# Patient Record
Sex: Male | Born: 1965
Health system: Southern US, Community
[De-identification: ages and names within clinical notes are randomized; demographics above are authoritative.]

## PROBLEM LIST (undated history)

## (undated) DIAGNOSIS — K529 Noninfective gastroenteritis and colitis, unspecified: Secondary | ICD-10-CM

## (undated) DIAGNOSIS — Z923 Personal history of irradiation: Secondary | ICD-10-CM

## (undated) DIAGNOSIS — I10 Essential (primary) hypertension: Secondary | ICD-10-CM

## (undated) DIAGNOSIS — C801 Malignant (primary) neoplasm, unspecified: Secondary | ICD-10-CM

## (undated) DIAGNOSIS — Z85810 Personal history of malignant neoplasm of tongue: Secondary | ICD-10-CM

## (undated) DIAGNOSIS — G4700 Insomnia, unspecified: Secondary | ICD-10-CM

## (undated) DIAGNOSIS — E7800 Pure hypercholesterolemia, unspecified: Secondary | ICD-10-CM

## (undated) DIAGNOSIS — I639 Cerebral infarction, unspecified: Secondary | ICD-10-CM

## (undated) DIAGNOSIS — K219 Gastro-esophageal reflux disease without esophagitis: Secondary | ICD-10-CM

## (undated) HISTORY — DX: Personal history of malignant neoplasm of tongue: Z85.810

## (undated) HISTORY — DX: Cerebral infarction, unspecified: I63.9

## (undated) HISTORY — DX: Gastro-esophageal reflux disease without esophagitis: K21.9

## (undated) HISTORY — PX: HAND SURGERY: SHX662

## (undated) HISTORY — DX: Malignant (primary) neoplasm, unspecified: C80.1

## (undated) HISTORY — PX: MOUTH SURGERY: SHX715

## (undated) HISTORY — PX: WISDOM TOOTH EXTRACTION: SHX21

---
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
## 1997-09-19 HISTORY — PX: BACK SURGERY: SHX140

## 2003-04-17 ENCOUNTER — Encounter: Admission: RE | Admit: 2003-04-17 | Discharge: 2003-04-17 | Payer: Self-pay | Admitting: Family Medicine

## 2003-04-17 ENCOUNTER — Encounter: Payer: Self-pay | Admitting: Family Medicine

## 2003-04-19 ENCOUNTER — Encounter: Payer: Self-pay | Admitting: Family Medicine

## 2003-04-19 ENCOUNTER — Encounter: Admission: RE | Admit: 2003-04-19 | Discharge: 2003-04-19 | Payer: Self-pay | Admitting: Family Medicine

## 2003-11-20 ENCOUNTER — Inpatient Hospital Stay (HOSPITAL_COMMUNITY): Admission: EM | Admit: 2003-11-20 | Discharge: 2003-11-22 | Payer: Self-pay | Admitting: Emergency Medicine

## 2003-11-22 ENCOUNTER — Encounter: Payer: Self-pay | Admitting: Cardiology

## 2004-11-27 ENCOUNTER — Emergency Department (HOSPITAL_COMMUNITY): Admission: EM | Admit: 2004-11-27 | Discharge: 2004-11-27 | Payer: Self-pay | Admitting: Emergency Medicine

## 2005-02-11 ENCOUNTER — Encounter: Admission: RE | Admit: 2005-02-11 | Discharge: 2005-02-11 | Payer: Self-pay | Admitting: Family Medicine

## 2005-02-20 ENCOUNTER — Encounter: Admission: RE | Admit: 2005-02-20 | Discharge: 2005-02-20 | Payer: Self-pay | Admitting: Family Medicine

## 2005-10-06 ENCOUNTER — Encounter: Admission: RE | Admit: 2005-10-06 | Discharge: 2005-10-06 | Payer: Self-pay | Admitting: Family Medicine

## 2005-12-10 ENCOUNTER — Encounter: Admission: RE | Admit: 2005-12-10 | Discharge: 2005-12-10 | Payer: Self-pay | Admitting: Family Medicine

## 2006-10-17 ENCOUNTER — Encounter: Admission: RE | Admit: 2006-10-17 | Discharge: 2006-10-17 | Payer: Self-pay | Admitting: Family Medicine

## 2018-08-05 ENCOUNTER — Observation Stay (HOSPITAL_COMMUNITY): Payer: Medicaid Other

## 2018-08-05 ENCOUNTER — Encounter (HOSPITAL_COMMUNITY): Payer: Self-pay | Admitting: Emergency Medicine

## 2018-08-05 ENCOUNTER — Inpatient Hospital Stay (HOSPITAL_COMMUNITY)
Admission: EM | Admit: 2018-08-05 | Discharge: 2018-08-09 | DRG: 064 | Disposition: A | Discharge status: Home / Self Care | Payer: Medicaid Other | Attending: Family Medicine | Admitting: Family Medicine

## 2018-08-05 ENCOUNTER — Other Ambulatory Visit: Payer: Self-pay

## 2018-08-05 ENCOUNTER — Emergency Department (HOSPITAL_COMMUNITY): Payer: Medicaid Other

## 2018-08-05 DIAGNOSIS — I2510 Atherosclerotic heart disease of native coronary artery without angina pectoris: Secondary | ICD-10-CM | POA: Diagnosis present

## 2018-08-05 DIAGNOSIS — D696 Thrombocytopenia, unspecified: Secondary | ICD-10-CM | POA: Diagnosis present

## 2018-08-05 DIAGNOSIS — R945 Abnormal results of liver function studies: Secondary | ICD-10-CM | POA: Diagnosis not present

## 2018-08-05 DIAGNOSIS — C090 Malignant neoplasm of tonsillar fossa: Secondary | ICD-10-CM | POA: Diagnosis present

## 2018-08-05 DIAGNOSIS — J358 Other chronic diseases of tonsils and adenoids: Secondary | ICD-10-CM

## 2018-08-05 DIAGNOSIS — R000 Tachycardia, unspecified: Secondary | ICD-10-CM | POA: Diagnosis not present

## 2018-08-05 DIAGNOSIS — R220 Localized swelling, mass and lump, head: Secondary | ICD-10-CM

## 2018-08-05 DIAGNOSIS — I6330 Cerebral infarction due to thrombosis of unspecified cerebral artery: Secondary | ICD-10-CM

## 2018-08-05 DIAGNOSIS — C099 Malignant neoplasm of tonsil, unspecified: Secondary | ICD-10-CM | POA: Diagnosis present

## 2018-08-05 DIAGNOSIS — Z23 Encounter for immunization: Secondary | ICD-10-CM

## 2018-08-05 DIAGNOSIS — K760 Fatty (change of) liver, not elsewhere classified: Secondary | ICD-10-CM | POA: Diagnosis present

## 2018-08-05 DIAGNOSIS — R569 Unspecified convulsions: Secondary | ICD-10-CM | POA: Diagnosis not present

## 2018-08-05 DIAGNOSIS — R29700 NIHSS score 0: Secondary | ICD-10-CM | POA: Diagnosis present

## 2018-08-05 DIAGNOSIS — Q211 Atrial septal defect: Secondary | ICD-10-CM

## 2018-08-05 DIAGNOSIS — D7589 Other specified diseases of blood and blood-forming organs: Secondary | ICD-10-CM | POA: Diagnosis present

## 2018-08-05 DIAGNOSIS — I10 Essential (primary) hypertension: Secondary | ICD-10-CM | POA: Diagnosis not present

## 2018-08-05 DIAGNOSIS — Y92410 Unspecified street and highway as the place of occurrence of the external cause: Secondary | ICD-10-CM

## 2018-08-05 DIAGNOSIS — N179 Acute kidney failure, unspecified: Secondary | ICD-10-CM | POA: Diagnosis present

## 2018-08-05 DIAGNOSIS — K219 Gastro-esophageal reflux disease without esophagitis: Secondary | ICD-10-CM | POA: Diagnosis present

## 2018-08-05 DIAGNOSIS — F1010 Alcohol abuse, uncomplicated: Secondary | ICD-10-CM | POA: Diagnosis present

## 2018-08-05 DIAGNOSIS — R7989 Other specified abnormal findings of blood chemistry: Secondary | ICD-10-CM

## 2018-08-05 DIAGNOSIS — I129 Hypertensive chronic kidney disease with stage 1 through stage 4 chronic kidney disease, or unspecified chronic kidney disease: Secondary | ICD-10-CM | POA: Diagnosis present

## 2018-08-05 DIAGNOSIS — E876 Hypokalemia: Secondary | ICD-10-CM | POA: Diagnosis not present

## 2018-08-05 DIAGNOSIS — I63521 Cerebral infarction due to unspecified occlusion or stenosis of right anterior cerebral artery: Principal | ICD-10-CM | POA: Diagnosis present

## 2018-08-05 DIAGNOSIS — I511 Rupture of chordae tendineae, not elsewhere classified: Secondary | ICD-10-CM | POA: Diagnosis present

## 2018-08-05 DIAGNOSIS — N182 Chronic kidney disease, stage 2 (mild): Secondary | ICD-10-CM | POA: Diagnosis present

## 2018-08-05 DIAGNOSIS — I739 Peripheral vascular disease, unspecified: Secondary | ICD-10-CM | POA: Diagnosis present

## 2018-08-05 DIAGNOSIS — R55 Syncope and collapse: Secondary | ICD-10-CM | POA: Diagnosis present

## 2018-08-05 DIAGNOSIS — S199XXA Unspecified injury of neck, initial encounter: Secondary | ICD-10-CM | POA: Diagnosis not present

## 2018-08-05 DIAGNOSIS — R410 Disorientation, unspecified: Secondary | ICD-10-CM | POA: Diagnosis not present

## 2018-08-05 DIAGNOSIS — F17210 Nicotine dependence, cigarettes, uncomplicated: Secondary | ICD-10-CM | POA: Diagnosis present

## 2018-08-05 HISTORY — DX: Essential (primary) hypertension: I10

## 2018-08-05 LAB — I-STAT CHEM 8, ED
BUN: 27 mg/dL — ABNORMAL HIGH (ref 6–20)
Calcium, Ion: 0.93 mmol/L — ABNORMAL LOW (ref 1.15–1.40)
Chloride: 96 mmol/L — ABNORMAL LOW (ref 98–111)
Creatinine, Ser: 1.5 mg/dL — ABNORMAL HIGH (ref 0.61–1.24)
Glucose, Bld: 115 mg/dL — ABNORMAL HIGH (ref 70–99)
HCT: 39 % (ref 39.0–52.0)
Hemoglobin: 13.3 g/dL (ref 13.0–17.0)
Potassium: 3 mmol/L — ABNORMAL LOW (ref 3.5–5.1)
Sodium: 135 mmol/L (ref 135–145)
TCO2: 28 mmol/L (ref 22–32)

## 2018-08-05 LAB — CBC WITH DIFFERENTIAL/PLATELET
Abs Immature Granulocytes: 0 10*3/uL (ref 0.0–0.1)
Basophils Absolute: 0.1 10*3/uL (ref 0.0–0.1)
Basophils Relative: 1 %
Eosinophils Absolute: 0.1 10*3/uL (ref 0.0–0.7)
Eosinophils Relative: 2 %
HCT: 42.8 % (ref 39.0–52.0)
Hemoglobin: 14.8 g/dL (ref 13.0–17.0)
Immature Granulocytes: 1 %
Lymphocytes Relative: 24 %
Lymphs Abs: 1.7 10*3/uL (ref 0.7–4.0)
MCH: 36.3 pg — ABNORMAL HIGH (ref 26.0–34.0)
MCHC: 34.6 g/dL (ref 30.0–36.0)
MCV: 104.9 fL — ABNORMAL HIGH (ref 78.0–100.0)
Monocytes Absolute: 0.7 10*3/uL (ref 0.1–1.0)
Monocytes Relative: 9 %
Neutro Abs: 4.5 10*3/uL (ref 1.7–7.7)
Neutrophils Relative %: 63 %
Platelets: 92 10*3/uL — ABNORMAL LOW (ref 150–400)
RBC: 4.08 MIL/uL — ABNORMAL LOW (ref 4.22–5.81)
RDW: 13.2 % (ref 11.5–15.5)
WBC: 7.1 10*3/uL (ref 4.0–10.5)

## 2018-08-05 LAB — RAPID URINE DRUG SCREEN, HOSP PERFORMED
Amphetamines: NOT DETECTED
Barbiturates: NOT DETECTED
Benzodiazepines: NOT DETECTED
Cocaine: NOT DETECTED
Opiates: NOT DETECTED
Tetrahydrocannabinol: NOT DETECTED

## 2018-08-05 LAB — COMPREHENSIVE METABOLIC PANEL
ALT: 23 U/L (ref 0–44)
AST: 46 U/L — ABNORMAL HIGH (ref 15–41)
Albumin: 3.8 g/dL (ref 3.5–5.0)
Alkaline Phosphatase: 73 U/L (ref 38–126)
Anion gap: 17 — ABNORMAL HIGH (ref 5–15)
BUN: 21 mg/dL — ABNORMAL HIGH (ref 6–20)
CO2: 25 mmol/L (ref 22–32)
Calcium: 8.2 mg/dL — ABNORMAL LOW (ref 8.9–10.3)
Chloride: 92 mmol/L — ABNORMAL LOW (ref 98–111)
Creatinine, Ser: 1.8 mg/dL — ABNORMAL HIGH (ref 0.61–1.24)
GFR calc Af Amer: 48 mL/min — ABNORMAL LOW (ref >60)
GFR calc non Af Amer: 42 mL/min — ABNORMAL LOW (ref >60)
Glucose, Bld: 118 mg/dL — ABNORMAL HIGH (ref 70–99)
Potassium: 2.9 mmol/L — ABNORMAL LOW (ref 3.5–5.1)
Sodium: 134 mmol/L — ABNORMAL LOW (ref 135–145)
Total Bilirubin: 1.8 mg/dL — ABNORMAL HIGH (ref 0.3–1.2)
Total Protein: 7.8 g/dL (ref 6.5–8.1)

## 2018-08-05 LAB — PROTIME-INR
INR: 0.96
PROTHROMBIN TIME: 12.7 s (ref 11.4–15.2)

## 2018-08-05 LAB — MAGNESIUM: Magnesium: 1.4 mg/dL — ABNORMAL LOW (ref 1.7–2.4)

## 2018-08-05 LAB — I-STAT TROPONIN, ED: Troponin i, poc: 0.02 ng/mL (ref 0.00–0.08)

## 2018-08-05 LAB — ETHANOL: Alcohol, Ethyl (B): <10 mg/dL (ref <10)

## 2018-08-05 LAB — CBG MONITORING, ED: Glucose-Capillary: 114 mg/dL — ABNORMAL HIGH (ref 70–99)

## 2018-08-05 MED ORDER — IOHEXOL 300 MG/ML  SOLN
100.0000 mL | Freq: Once | INTRAMUSCULAR | Status: AC | PRN
  Administered 2018-08-05: 100 mL via INTRAVENOUS

## 2018-08-05 MED ORDER — MAGNESIUM SULFATE 2 GM/50ML IV SOLN
2.0000 g | Freq: Once | INTRAVENOUS | Status: AC
  Administered 2018-08-05: 2 g via INTRAVENOUS
  Filled 2018-08-05: qty 50

## 2018-08-05 MED ORDER — TETANUS-DIPHTH-ACELL PERTUSSIS 5-2.5-18.5 LF-MCG/0.5 IM SUSP
0.5000 mL | Freq: Once | INTRAMUSCULAR | Status: AC
  Administered 2018-08-05: 0.5 mL via INTRAMUSCULAR
  Filled 2018-08-05: qty 0.5

## 2018-08-05 MED ORDER — POTASSIUM CHLORIDE CRYS ER 20 MEQ PO TBCR
40.0000 meq | EXTENDED_RELEASE_TABLET | Freq: Once | ORAL | Status: AC
  Administered 2018-08-05: 40 meq via ORAL
  Filled 2018-08-05: qty 2

## 2018-08-05 MED ORDER — ACETAMINOPHEN 650 MG RE SUPP: 650.0000 mg | Freq: Four times a day (QID) | RECTAL | Status: DC | PRN

## 2018-08-05 MED ORDER — POTASSIUM CHLORIDE 10 MEQ/100ML IV SOLN
INTRAVENOUS | Status: AC
  Administered 2018-08-05: 10 meq via INTRAVENOUS
  Filled 2018-08-05: qty 100

## 2018-08-05 MED ORDER — POTASSIUM CHLORIDE 10 MEQ/100ML IV SOLN
10.0000 meq | INTRAVENOUS | Status: AC
  Administered 2018-08-05 (×3): 10 meq via INTRAVENOUS
  Filled 2018-08-05 (×2): qty 100

## 2018-08-05 MED ORDER — POTASSIUM CHLORIDE IN NACL 20-0.9 MEQ/L-% IV SOLN
INTRAVENOUS | Status: DC
  Administered 2018-08-05 – 2018-08-06 (×2): via INTRAVENOUS
  Filled 2018-08-05 (×4): qty 1000

## 2018-08-05 MED ORDER — ACETAMINOPHEN 325 MG PO TABS
650.0000 mg | ORAL_TABLET | Freq: Four times a day (QID) | ORAL | Status: DC | PRN
  Administered 2018-08-06: 650 mg via ORAL
  Filled 2018-08-05: qty 2

## 2018-08-05 MED ORDER — ENOXAPARIN SODIUM 40 MG/0.4ML ~~LOC~~ SOLN
40.0000 mg | SUBCUTANEOUS | Status: DC
  Administered 2018-08-06 – 2018-08-08 (×3): 40 mg via SUBCUTANEOUS
  Filled 2018-08-05 (×3): qty 0.4

## 2018-08-05 MED ORDER — SODIUM CHLORIDE 0.9 % IV BOLUS
1000.0000 mL | Freq: Once | INTRAVENOUS | Status: AC
  Administered 2018-08-05: 1000 mL via INTRAVENOUS

## 2018-08-05 NOTE — ED Notes (Signed)
Pt back in room from US.

## 2018-08-05 NOTE — ED Notes (Signed)
Pt aware of need for urine sample, urinal at bedside 

## 2018-08-05 NOTE — ED Notes (Signed)
Report attempted x 1

## 2018-08-05 NOTE — ED Notes (Signed)
Report attempted x2
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
## 2018-08-05 NOTE — ED Notes (Signed)
Patient transported to US 

## 2018-08-06 ENCOUNTER — Inpatient Hospital Stay (HOSPITAL_COMMUNITY): Payer: Medicaid Other

## 2018-08-06 ENCOUNTER — Observation Stay (HOSPITAL_COMMUNITY): Payer: Medicaid Other

## 2018-08-06 ENCOUNTER — Observation Stay (HOSPITAL_BASED_OUTPATIENT_CLINIC_OR_DEPARTMENT_OTHER): Payer: Medicaid Other

## 2018-08-06 DIAGNOSIS — I2510 Atherosclerotic heart disease of native coronary artery without angina pectoris: Secondary | ICD-10-CM | POA: Diagnosis present

## 2018-08-06 DIAGNOSIS — I639 Cerebral infarction, unspecified: Secondary | ICD-10-CM

## 2018-08-06 DIAGNOSIS — F17210 Nicotine dependence, cigarettes, uncomplicated: Secondary | ICD-10-CM | POA: Diagnosis present

## 2018-08-06 DIAGNOSIS — R55 Syncope and collapse: Secondary | ICD-10-CM | POA: Diagnosis not present

## 2018-08-06 DIAGNOSIS — R7989 Other specified abnormal findings of blood chemistry: Secondary | ICD-10-CM | POA: Diagnosis not present

## 2018-08-06 DIAGNOSIS — K760 Fatty (change of) liver, not elsewhere classified: Secondary | ICD-10-CM | POA: Diagnosis present

## 2018-08-06 DIAGNOSIS — Z23 Encounter for immunization: Secondary | ICD-10-CM | POA: Diagnosis not present

## 2018-08-06 DIAGNOSIS — E876 Hypokalemia: Secondary | ICD-10-CM

## 2018-08-06 DIAGNOSIS — I511 Rupture of chordae tendineae, not elsewhere classified: Secondary | ICD-10-CM | POA: Diagnosis present

## 2018-08-06 DIAGNOSIS — C099 Malignant neoplasm of tonsil, unspecified: Secondary | ICD-10-CM

## 2018-08-06 DIAGNOSIS — R29700 NIHSS score 0: Secondary | ICD-10-CM | POA: Diagnosis present

## 2018-08-06 DIAGNOSIS — I739 Peripheral vascular disease, unspecified: Secondary | ICD-10-CM | POA: Diagnosis not present

## 2018-08-06 DIAGNOSIS — Q211 Atrial septal defect: Secondary | ICD-10-CM | POA: Diagnosis not present

## 2018-08-06 DIAGNOSIS — N182 Chronic kidney disease, stage 2 (mild): Secondary | ICD-10-CM | POA: Diagnosis present

## 2018-08-06 DIAGNOSIS — K219 Gastro-esophageal reflux disease without esophagitis: Secondary | ICD-10-CM | POA: Diagnosis present

## 2018-08-06 DIAGNOSIS — F1010 Alcohol abuse, uncomplicated: Secondary | ICD-10-CM | POA: Diagnosis present

## 2018-08-06 DIAGNOSIS — S199XXA Unspecified injury of neck, initial encounter: Secondary | ICD-10-CM | POA: Diagnosis not present

## 2018-08-06 DIAGNOSIS — I10 Essential (primary) hypertension: Secondary | ICD-10-CM | POA: Diagnosis not present

## 2018-08-06 DIAGNOSIS — I082 Rheumatic disorders of both aortic and tricuspid valves: Secondary | ICD-10-CM | POA: Diagnosis not present

## 2018-08-06 DIAGNOSIS — D696 Thrombocytopenia, unspecified: Secondary | ICD-10-CM | POA: Diagnosis present

## 2018-08-06 DIAGNOSIS — I351 Nonrheumatic aortic (valve) insufficiency: Secondary | ICD-10-CM | POA: Diagnosis not present

## 2018-08-06 DIAGNOSIS — N179 Acute kidney failure, unspecified: Secondary | ICD-10-CM | POA: Diagnosis present

## 2018-08-06 DIAGNOSIS — D7589 Other specified diseases of blood and blood-forming organs: Secondary | ICD-10-CM | POA: Diagnosis present

## 2018-08-06 DIAGNOSIS — Y92410 Unspecified street and highway as the place of occurrence of the external cause: Secondary | ICD-10-CM | POA: Diagnosis not present

## 2018-08-06 DIAGNOSIS — I129 Hypertensive chronic kidney disease with stage 1 through stage 4 chronic kidney disease, or unspecified chronic kidney disease: Secondary | ICD-10-CM | POA: Diagnosis present

## 2018-08-06 DIAGNOSIS — I6330 Cerebral infarction due to thrombosis of unspecified cerebral artery: Secondary | ICD-10-CM | POA: Diagnosis not present

## 2018-08-06 DIAGNOSIS — I63521 Cerebral infarction due to unspecified occlusion or stenosis of right anterior cerebral artery: Secondary | ICD-10-CM | POA: Diagnosis present

## 2018-08-06 LAB — HIV ANTIBODY (ROUTINE TESTING W REFLEX): HIV SCREEN 4TH GENERATION: NONREACTIVE

## 2018-08-06 LAB — CBC
HEMATOCRIT: 37.7 % — AB (ref 39.0–52.0)
Hemoglobin: 13.2 g/dL (ref 13.0–17.0)
MCH: 37.1 pg — ABNORMAL HIGH (ref 26.0–34.0)
MCHC: 35 g/dL (ref 30.0–36.0)
MCV: 105.9 fL — AB (ref 78.0–100.0)
Platelets: 68 10*3/uL — ABNORMAL LOW (ref 150–400)
RBC: 3.56 MIL/uL — ABNORMAL LOW (ref 4.22–5.81)
RDW: 13.5 % (ref 11.5–15.5)
WBC: 4.9 10*3/uL (ref 4.0–10.5)

## 2018-08-06 LAB — COMPREHENSIVE METABOLIC PANEL
ALBUMIN: 3.2 g/dL — AB (ref 3.5–5.0)
ALT: 17 U/L (ref 0–44)
AST: 40 U/L (ref 15–41)
Alkaline Phosphatase: 60 U/L (ref 38–126)
Anion gap: 11 (ref 5–15)
BUN: 14 mg/dL (ref 6–20)
CHLORIDE: 100 mmol/L (ref 98–111)
CO2: 26 mmol/L (ref 22–32)
CREATININE: 1.43 mg/dL — AB (ref 0.61–1.24)
Calcium: 7.5 mg/dL — ABNORMAL LOW (ref 8.9–10.3)
GFR calc Af Amer: >60 mL/min (ref >60)
GFR calc non Af Amer: 55 mL/min — ABNORMAL LOW (ref >60)
GLUCOSE: 121 mg/dL — AB (ref 70–99)
Potassium: 2.8 mmol/L — ABNORMAL LOW (ref 3.5–5.1)
Sodium: 137 mmol/L (ref 135–145)
Total Bilirubin: 2 mg/dL — ABNORMAL HIGH (ref 0.3–1.2)
Total Protein: 6.7 g/dL (ref 6.5–8.1)

## 2018-08-06 LAB — TROPONIN I
Troponin I: 0.03 ng/mL (ref <0.03)
Troponin I: 0.03 ng/mL (ref <0.03)

## 2018-08-06 LAB — ECHOCARDIOGRAM COMPLETE
Height: 66 in
Weight: 2723.2 oz

## 2018-08-06 LAB — D-DIMER, QUANTITATIVE (NOT AT ARMC): D DIMER QUANT: 0.93 ug{FEU}/mL — AB (ref 0.00–0.50)

## 2018-08-06 LAB — MAGNESIUM: Magnesium: 1.5 mg/dL — ABNORMAL LOW (ref 1.7–2.4)

## 2018-08-06 MED ORDER — POTASSIUM CHLORIDE CRYS ER 20 MEQ PO TBCR
40.0000 meq | EXTENDED_RELEASE_TABLET | Freq: Once | ORAL | Status: AC
  Administered 2018-08-06: 40 meq via ORAL
  Filled 2018-08-06: qty 2

## 2018-08-06 MED ORDER — NICOTINE 21 MG/24HR TD PT24
21.0000 mg | MEDICATED_PATCH | Freq: Every day | TRANSDERMAL | Status: DC
  Filled 2018-08-06 (×2): qty 1

## 2018-08-06 MED ORDER — THIAMINE HCL 100 MG/ML IJ SOLN
100.0000 mg | Freq: Every day | INTRAMUSCULAR | Status: DC
  Filled 2018-08-06: qty 2

## 2018-08-06 MED ORDER — IOPAMIDOL (ISOVUE-370) INJECTION 76%
INTRAVENOUS | Status: AC
  Filled 2018-08-06: qty 100

## 2018-08-06 MED ORDER — MAGNESIUM SULFATE 2 GM/50ML IV SOLN
2.0000 g | Freq: Once | INTRAVENOUS | Status: AC
  Administered 2018-08-06: 2 g via INTRAVENOUS
  Filled 2018-08-06: qty 50

## 2018-08-06 MED ORDER — LORAZEPAM 2 MG/ML IJ SOLN: 0.0000 mg | Freq: Two times a day (BID) | INTRAMUSCULAR | Status: DC

## 2018-08-06 MED ORDER — LORAZEPAM 2 MG/ML IJ SOLN
0.0000 mg | Freq: Four times a day (QID) | INTRAMUSCULAR | Status: DC
Start: 2018-08-06 — End: 2018-08-08

## 2018-08-06 MED ORDER — POTASSIUM CHLORIDE IN NACL 20-0.9 MEQ/L-% IV SOLN
INTRAVENOUS | Status: AC
  Administered 2018-08-06 – 2018-08-07 (×2): via INTRAVENOUS
  Filled 2018-08-06 (×2): qty 1000

## 2018-08-06 MED ORDER — ATORVASTATIN CALCIUM 40 MG PO TABS
40.0000 mg | ORAL_TABLET | Freq: Every day | ORAL | Status: DC
  Administered 2018-08-07 – 2018-08-08 (×2): 40 mg via ORAL
  Filled 2018-08-06 (×2): qty 1

## 2018-08-06 MED ORDER — VITAMIN B-1 100 MG PO TABS
100.0000 mg | ORAL_TABLET | Freq: Every day | ORAL | Status: DC
  Administered 2018-08-06 – 2018-08-09 (×4): 100 mg via ORAL
  Filled 2018-08-06 (×4): qty 1

## 2018-08-06 MED ORDER — ADULT MULTIVITAMIN W/MINERALS CH
1.0000 | ORAL_TABLET | Freq: Every day | ORAL | Status: DC
  Administered 2018-08-06 – 2018-08-09 (×4): 1 via ORAL
  Filled 2018-08-06 (×4): qty 1

## 2018-08-06 MED ORDER — LORAZEPAM 2 MG/ML IJ SOLN: 1.0000 mg | Freq: Four times a day (QID) | INTRAMUSCULAR | Status: DC | PRN

## 2018-08-06 MED ORDER — LORAZEPAM 1 MG PO TABS: 1.0000 mg | ORAL_TABLET | Freq: Four times a day (QID) | ORAL | Status: DC | PRN

## 2018-08-06 MED ORDER — IOPAMIDOL (ISOVUE-370) INJECTION 76%
100.0000 mL | Freq: Once | INTRAVENOUS | Status: AC | PRN
  Administered 2018-08-06: 100 mL via INTRAVENOUS

## 2018-08-06 MED ORDER — FOLIC ACID 1 MG PO TABS
1.0000 mg | ORAL_TABLET | Freq: Every day | ORAL | Status: DC
  Administered 2018-08-06 – 2018-08-07 (×2): 1 mg via ORAL
  Filled 2018-08-06 (×2): qty 1

## 2018-08-06 MED ORDER — ASPIRIN 325 MG PO TABS
325.0000 mg | ORAL_TABLET | Freq: Every day | ORAL | Status: DC
  Administered 2018-08-06 – 2018-08-09 (×4): 325 mg via ORAL
  Filled 2018-08-06 (×4): qty 1

## 2018-08-06 NOTE — Progress Notes (Signed)
EEG complete - results pending 

## 2018-08-06 NOTE — Progress Notes (Signed)
Pt's BP 177/109.  Pt denies any pain. Md on call made aware. No new orders received. MD stated he is okay with BP less than 220/110 for the next 24 hours. Will cont to monitor pt.
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
## 2018-08-06 NOTE — Procedures (Signed)
History: 52 yo M being evaluated for syncope  Sedation: None  Technique: This is a 21 channel routine scalp EEG performed at the bedside with bipolar and monopolar montages arranged in accordance to the international 10/20 system of electrode placement. One channel was dedicated to EKG recording.    Background: The background consists of intermixed alpha and beta activities. There is a well defined posterior dominant rhythm of 8 Hz that attenuates with eye opening. Sleep is not recorded.    EEG Abnormalities: None  Clinical Interpretation: This normal EEG is recorded in the waking state. There was no seizure or seizure predisposition recorded on this study. Please note that a normal EEG does not preclude the possibility of epilepsy.   Roland Rack, MD [facility] [phone]  If 7pm- 7am, please page neurology on call as listed in [place].

## 2018-08-06 NOTE — Progress Notes (Signed)
  Echocardiogram 2D Echocardiogram has been performed.  Jennette Dubin 08/06/2018, 11:18 AM
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
## 2018-08-06 NOTE — Progress Notes (Signed)
*  Preliminary Results* Carotid artery duplex has been completed. Bilateral internal carotid arteries are 1-39%.  Vertebral arteries are patent with antegrade flow.  08/06/2018 10:40 AM  Brian Dixon Dawna Part
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
## 2018-08-06 NOTE — Care Management Note (Signed)
Case Management Note  Patient Details  Name: Brian Dixon MRN: [id number] Date of Birth: 21-May-1966  Subjective/Objective:     Syncope              Action/Plan: Patient was independent prior to admission; he lost his job, no PCP, no medical insurance; he is agreeable to go to the [facility] for follow up care. The Financial Counselor to see the patient to determine what he might qualify for. CM will continue to follow for progression of care.  Expected Discharge Date:    possibly 08/09/2018              Expected Discharge Plan:  Home/Self Care  In-House Referral:   Financial Counselor  Discharge planning Services  CM Consult  Status of Service:  In process, will continue to follow  Sherrilyn Rist [phone] 08/06/2018, 1:55 PM

## 2018-08-06 NOTE — Progress Notes (Signed)
    [facility] has been requested to perform a transesophageal echocardiogram on Brian Dixon for stroke.  After careful review of history and examination, the risks and benefits of transesophageal echocardiogram have been explained including risks of esophageal damage, perforation (1:10,000 risk), bleeding, pharyngeal hematoma as well as other potential complications associated with conscious sedation including aspiration, arrhythmia, respiratory failure and death. Alternatives to treatment were discussed, questions were answered. Patient is willing to proceed.   Pt is scheduled for TEE in endoscopy 08/09/18 at noon with Dr. Meda Coffee. NPO Sunday night.  Tami Lin [place], [place]  08/06/2018 3:21 PM

## 2018-08-07 ENCOUNTER — Inpatient Hospital Stay (HOSPITAL_COMMUNITY): Payer: Medicaid Other

## 2018-08-07 DIAGNOSIS — I6330 Cerebral infarction due to thrombosis of unspecified cerebral artery: Secondary | ICD-10-CM

## 2018-08-07 LAB — LIPID PANEL
CHOLESTEROL: 173 mg/dL (ref 0–200)
HDL: 54 mg/dL (ref >40)
LDL CALC: 100 mg/dL — AB (ref 0–99)
TRIGLYCERIDES: 97 mg/dL (ref <150)
Total CHOL/HDL Ratio: 3.2 RATIO
VLDL: 19 mg/dL (ref 0–40)

## 2018-08-07 LAB — BASIC METABOLIC PANEL
Anion gap: 8 (ref 5–15)
BUN: 11 mg/dL (ref 6–20)
CHLORIDE: 101 mmol/L (ref 98–111)
CO2: 27 mmol/L (ref 22–32)
Calcium: 7.3 mg/dL — ABNORMAL LOW (ref 8.9–10.3)
Creatinine, Ser: 1.32 mg/dL — ABNORMAL HIGH (ref 0.61–1.24)
GFR calc Af Amer: >60 mL/min (ref >60)
GFR calc non Af Amer: >60 mL/min (ref >60)
Glucose, Bld: 82 mg/dL (ref 70–99)
Potassium: 2.8 mmol/L — ABNORMAL LOW (ref 3.5–5.1)
SODIUM: 136 mmol/L (ref 135–145)

## 2018-08-07 LAB — RAPID URINE DRUG SCREEN, HOSP PERFORMED
AMPHETAMINES: NOT DETECTED
Barbiturates: NOT DETECTED
Benzodiazepines: NOT DETECTED
COCAINE: NOT DETECTED
OPIATES: NOT DETECTED
Tetrahydrocannabinol: NOT DETECTED

## 2018-08-07 LAB — HEPATITIS PANEL, ACUTE
HEP A IGM: NEGATIVE
HEP B C IGM: NEGATIVE
Hepatitis B Surface Ag: NEGATIVE

## 2018-08-07 LAB — FOLATE: Folate: 7.1 ng/mL (ref >5.9)

## 2018-08-07 LAB — MAGNESIUM: MAGNESIUM: 1.6 mg/dL — AB (ref 1.7–2.4)

## 2018-08-07 LAB — FERRITIN: Ferritin: 319 ng/mL (ref 24–336)

## 2018-08-07 LAB — HEMOGLOBIN A1C
HEMOGLOBIN A1C: 4.7 % — AB (ref 4.8–5.6)
Mean Plasma Glucose: 88.19 mg/dL

## 2018-08-07 LAB — VITAMIN B12: Vitamin B-12: 202 pg/mL (ref 180–914)

## 2018-08-07 MED ORDER — AMLODIPINE BESYLATE 10 MG PO TABS
10.0000 mg | ORAL_TABLET | Freq: Every day | ORAL | Status: DC
  Administered 2018-08-07 – 2018-08-09 (×3): 10 mg via ORAL
  Filled 2018-08-07 (×2): qty 1

## 2018-08-07 MED ORDER — MAGNESIUM SULFATE 2 GM/50ML IV SOLN
2.0000 g | Freq: Once | INTRAVENOUS | Status: AC
  Administered 2018-08-07: 2 g via INTRAVENOUS
  Filled 2018-08-07: qty 50

## 2018-08-07 MED ORDER — CYANOCOBALAMIN 1000 MCG/ML IJ SOLN
1000.0000 ug | Freq: Once | INTRAMUSCULAR | Status: AC
  Administered 2018-08-07: 1000 ug via INTRAMUSCULAR
  Filled 2018-08-07: qty 1

## 2018-08-07 MED ORDER — LISINOPRIL 20 MG PO TABS
20.0000 mg | ORAL_TABLET | Freq: Every day | ORAL | Status: DC
  Administered 2018-08-07 – 2018-08-09 (×3): 20 mg via ORAL
  Filled 2018-08-07 (×2): qty 1

## 2018-08-07 MED ORDER — POTASSIUM CHLORIDE IN NACL 20-0.9 MEQ/L-% IV SOLN
INTRAVENOUS | Status: DC
  Administered 2018-08-07 – 2018-08-08 (×2): via INTRAVENOUS
  Filled 2018-08-07 (×3): qty 1000

## 2018-08-07 MED ORDER — VITAMIN B-12 1000 MCG PO TABS
1000.0000 ug | ORAL_TABLET | Freq: Every day | ORAL | Status: DC
  Administered 2018-08-08 – 2018-08-09 (×2): 1000 ug via ORAL
  Filled 2018-08-07 (×2): qty 1

## 2018-08-07 MED ORDER — POTASSIUM CHLORIDE CRYS ER 20 MEQ PO TBCR
40.0000 meq | EXTENDED_RELEASE_TABLET | Freq: Once | ORAL | Status: AC
  Administered 2018-08-07: 40 meq via ORAL
  Filled 2018-08-07: qty 2

## 2018-08-07 MED ORDER — VITAMIN B-12 1000 MCG PO TABS: 1000.0000 ug | ORAL_TABLET | Freq: Every day | ORAL | Status: DC
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
## 2018-08-08 ENCOUNTER — Inpatient Hospital Stay (HOSPITAL_COMMUNITY): Payer: Medicaid Other

## 2018-08-08 DIAGNOSIS — I6330 Cerebral infarction due to thrombosis of unspecified cerebral artery: Secondary | ICD-10-CM

## 2018-08-08 LAB — BASIC METABOLIC PANEL
Anion gap: 10 (ref 5–15)
BUN: 10 mg/dL (ref 6–20)
CHLORIDE: 98 mmol/L (ref 98–111)
CO2: 26 mmol/L (ref 22–32)
Calcium: 7.7 mg/dL — ABNORMAL LOW (ref 8.9–10.3)
Creatinine, Ser: 1.34 mg/dL — ABNORMAL HIGH (ref 0.61–1.24)
GFR calc Af Amer: >60 mL/min (ref >60)
GFR calc non Af Amer: 59 mL/min — ABNORMAL LOW (ref >60)
Glucose, Bld: 93 mg/dL (ref 70–99)
Potassium: 2.9 mmol/L — ABNORMAL LOW (ref 3.5–5.1)
SODIUM: 134 mmol/L — AB (ref 135–145)

## 2018-08-08 MED ORDER — GUAIFENESIN-DM 100-10 MG/5ML PO SYRP
5.0000 mL | ORAL_SOLUTION | ORAL | Status: DC | PRN
  Administered 2018-08-08: 5 mL via ORAL
  Filled 2018-08-08: qty 5

## 2018-08-08 MED ORDER — MAGNESIUM SULFATE 2 GM/50ML IV SOLN
2.0000 g | Freq: Once | INTRAVENOUS | Status: AC
  Administered 2018-08-08: 2 g via INTRAVENOUS
  Filled 2018-08-08: qty 50

## 2018-08-08 MED ORDER — POTASSIUM CHLORIDE CRYS ER 20 MEQ PO TBCR
40.0000 meq | EXTENDED_RELEASE_TABLET | Freq: Three times a day (TID) | ORAL | Status: AC
  Administered 2018-08-08 (×2): 40 meq via ORAL
  Filled 2018-08-08 (×2): qty 2

## 2018-08-08 MED ORDER — POTASSIUM CHLORIDE CRYS ER 20 MEQ PO TBCR
40.0000 meq | EXTENDED_RELEASE_TABLET | Freq: Two times a day (BID) | ORAL | Status: DC
  Administered 2018-08-08: 40 meq via ORAL
  Filled 2018-08-08: qty 2

## 2018-08-08 MED ORDER — SODIUM CHLORIDE 0.9 % IV SOLN: INTRAVENOUS | Status: DC

## 2018-08-08 NOTE — Progress Notes (Signed)
STROKE TEAM PROGRESS NOTE   SUBJECTIVE (INTERVAL HISTORY) No family is at the bedside.  Patient sitting in chair watching TV. No complains. No acute event overnight. Pending TEE in am. NPO after midnight.   OBJECTIVE Temp:  [97.5 F (36.4 C)-99.1 F (37.3 C)] 97.5 F (36.4 C) (08/11 0451) Pulse Rate:  [79-90] 88 (08/11 0451) Cardiac Rhythm: Normal sinus rhythm (08/10 1918) Resp:  [16-20] 16 (08/11 0451) BP: (158-186)/(86-112) 158/86 (08/11 0451) SpO2:  [92 %-100 %] 98 % (08/11 0451) Weight:  [77.6 kg] 77.6 kg (08/11 0451)  CBC:  Recent Labs  Lab 08/05/18 1223 08/05/18 1345 08/06/18 0705  WBC 7.1  --  4.9  NEUTROABS 4.5  --   --   HGB 14.8 13.3 13.2  HCT 42.8 39.0 37.7*  MCV 104.9*  --  105.9*  PLT 92*  --  68*    Basic Metabolic Panel:  Recent Labs  Lab 08/06/18 0705 08/07/18 0302 08/08/18 0357  NA 137 136 134*  K 2.8* 2.8* 2.9*  CL 100 101 98  CO2 26 27 26   GLUCOSE 121* 82 93  BUN 14 11 10   CREATININE 1.43* 1.32* 1.34*  CALCIUM 7.5* 7.3* 7.7*  MG 1.5* 1.6*  --     Lipid Panel:     Component Value Date/Time   CHOL 173 08/07/2018 0302   TRIG 97 08/07/2018 0302   HDL 54 08/07/2018 0302   CHOLHDL 3.2 08/07/2018 0302   VLDL 19 08/07/2018 0302   LDLCALC 100 (H) 08/07/2018 0302   HgbA1c:  Lab Results  Component Value Date   HGBA1C 4.7 (L) 08/07/2018   Urine Drug Screen:     Component Value Date/Time   LABOPIA NONE DETECTED 08/07/2018 1220   COCAINSCRNUR NONE DETECTED 08/07/2018 1220   LABBENZ NONE DETECTED 08/07/2018 1220   AMPHETMU NONE DETECTED 08/07/2018 1220   THCU NONE DETECTED 08/07/2018 1220   LABBARB NONE DETECTED 08/07/2018 1220    Alcohol Level     Component Value Date/Time   ETH <10 08/05/2018 1319    IMAGING I have personally reviewed the radiological images below and agree with the radiology interpretations.  Ct Head Wo Contrast Ct Cervical Spine Wo Contrast 08/05/2018 IMPRESSION:   CT head:  1. Atrophy with periventricular  small vessel disease. No acute infarct. No evident acute hemorrhage.  2. Mass in the anterior third ventricle with increased attenuation, not causing hydrocephalus. 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  3.  Foci of aortic vascular calcification noted.  4.  There are foci of paranasal sinus disease.   CT cervical spine:  1.  No fracture. Areas of slight spondylolisthesis at C5-6 and C6-7 are felt to be due to underlying spondylosis.  2.  Multilevel osteoarthritic change.  3.  Foci of carotid artery calcification bilaterally.   Ct Soft Tissue Neck W Contrast 08/05/2018 IMPRESSION:  1. 4.2 Cm mass in the right oral cavity and oropharynx involving the tonsillar fossa, glossotonsillar sulcus, and right floor of mouth. There is prominent degree of submucosal tumor in the right anterior and posterior tongue. 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 No adenopathy noted in the neck.  2. Findings of sialadenitis in the right submandibular gland, which contacts the mass. There is likely both postobstructive sialadenitis and direct tumor invasion.   Ct Chest W Contrast 08/05/2018 IMPRESSION:  Negative for acute finding of the chest.  Coronary  artery disease of the left anterior descending.   Ct Angio Chest Pe W Or Wo Contrast 08/06/2018 IMPRESSION:  Negative for pulmonary emboli. Coronary artery disease.   Mr Brain Wo Contrast 08/05/2018 IMPRESSION:  1. Punctate focus of reduced diffusion within the left anterior inferior basal ganglia compatible with acute/early subacute infarction. No associated hemorrhage or mass effect.  2. No findings of cortical contusion, diffuse axonal injury, or intracranial hemorrhage.  3. Severe chronic microvascular ischemic changes and volume loss of the brain for age.  4. 9 mm colloid cyst.  No associated hydrocephalus.   US Abdomen Limited  Ruq 08/05/2018 IMPRESSION:  1. 5 mm soft tissue focus within the contracted gallbladder likely representing a small polyp.  2. No secondary signs of cholecystitis.  3. Echogenic liver parenchyma more commonly representing hepatic steatosis.   Transthoracic Echocardiogram  08/06/2018 Impressions: - Normal LV systolic function. No significant valvular disease.   Small echodense mobile target noted in LV, suspect that it is a   portion of mitral valve apparatus/small section of papillary   muscle, but cannot exclude other etiologies. Does not appear to   be causing significant mitral regurgitation.  Bilateral Carotid Dopplers 08/06/2018 Final Interpretation: Right Carotid: Velocities in the right ICA are consistent with a 1-39% stenosis. Left Carotid: Velocities in the left ICA are consistent with a 1-39% stenosis. Vertebrals: Bilateral vertebral arteries demonstrate antegrade flow.  TEE - pending 08/09/2018  EEG 08/06/2018 EEG Abnormalities: None Clinical Interpretation: This normal EEG is recorded in the waking state. There was no seizure or seizure predisposition recorded on this study. Please note that a normal EEG does not preclude the possibility of epilepsy.     PHYSICAL EXAM  Temp:  [97.5 F (36.4 C)-99.1 F (37.3 C)] 97.5 F (36.4 C) (08/11 0451) Pulse Rate:  [79-90] 88 (08/11 0451) Resp:  [16-20] 16 (08/11 0451) BP: (158-186)/(86-112) 158/86 (08/11 0451) SpO2:  [92 %-100 %] 98 % (08/11 0451) Weight:  [77.6 kg] 77.6 kg (08/11 0451)  General - Well nourished, well developed, in no apparent distress.  Ophthalmologic - fundi not visualized due to noncooperation.  Cardiovascular - Regular rate and rhythm with no murmur.  Mental Status -  Level of arousal and orientation to time, place, and person were intact. Language including expression, naming, repetition, comprehension was assessed and found intact. Attention span and concentration were normal. Fund of Knowledge  was assessed and was intact.  Cranial Nerves II - XII - II - Visual field intact OU. III, IV, VI - Extraocular movements intact. V - Facial sensation intact bilaterally. VII - Facial movement intact bilaterally. VIII - Hearing & vestibular intact bilaterally. X - Palate elevates symmetrically. XI - Chin turning & shoulder shrug intact bilaterally. XII - Tongue protrusion intact.  Motor Strength - The patient's strength was normal in all extremities and pronator drift was absent.  Bulk was normal and fasciculations were absent.   Motor Tone - Muscle tone was assessed at the neck and appendages and was normal.  Reflexes - The patient's reflexes were symmetrical in all extremities and he had no pathological reflexes.  Sensory - Light touch, temperature/pinprick were assessed and were symmetrical.    Coordination - The patient had normal movements in the hands and feet with no ataxia or dysmetria.  Tremor was absent.  Gait and Station - normal gait and stance.   ASSESSMENT/PLAN Brian Dixon is a 52 y.o. male with history of tobacco heavy use1.5-2 ppdfor >35 yrs, HTN, alcohol binge  drinking with binge drinking presenting with syncope. He did not receive IV t-PA due to no stroke like deficits.  Syncope  likely vasovagal syncope from nausea/dry hive related to heavy smoking and binge drinking  EEG normal  Now back to baseline  Educated on quit smoking and decrease drinking to < 2 drink per day  EF normal  EKG sinus tachy   TEE pending to rule out MV mobile density  No driving until episodes free for 6 months  Incidental stroke:  small left basal ganglia infarct - likely small vessel disease given significant risk factors.  Resultant  Back to baseline  CT head - Atrophy with periventricular small vessel disease.  MRI head - Punctate focus of reduced diffusion within the left anterior inferior basal ganglia compatible with acute/early subacute infarction.  MRA  head - left A1 severe stenosis  EEG - normal  Carotid Doppler - normal  2D Echo - NL LV systolic fx EF 40-37% - Small echodense mobile target in MV  TEE - pending - 08/09/2018  LDL - 100  HgbA1c - 4.7  VTE prophylaxis - Lovenox  No antithrombotic prior to admission, now on aspirin 325 mg daily  Patient counseled to be compliant with his antithrombotic medications  Ongoing aggressive stroke risk factor management  Therapy recommendations: none  Disposition:  Pending  Hypertension . BP tends to run high  . On amlodipine 10 and lisinopril 20mg   . 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 After hours, contact General Neurology
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
## 2018-08-08 NOTE — Plan of Care (Signed)
  Problem: Education: Goal: Knowledge of General Education information will improve Description Including pain rating scale, medication(s)/side effects and non-pharmacologic comfort measures Outcome: Completed/Met
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
## 2018-08-09 ENCOUNTER — Inpatient Hospital Stay (HOSPITAL_COMMUNITY): Payer: Medicaid Other | Admitting: Certified Registered Nurse Anesthetist

## 2018-08-09 ENCOUNTER — Encounter (HOSPITAL_COMMUNITY): Payer: Self-pay

## 2018-08-09 ENCOUNTER — Inpatient Hospital Stay (HOSPITAL_COMMUNITY): Payer: Medicaid Other

## 2018-08-09 ENCOUNTER — Encounter (HOSPITAL_COMMUNITY)
Admission: EM | Disposition: A | Discharge status: Home / Self Care | Payer: Self-pay | Source: Home / Self Care | Attending: Family Medicine

## 2018-08-09 DIAGNOSIS — I351 Nonrheumatic aortic (valve) insufficiency: Secondary | ICD-10-CM

## 2018-08-09 HISTORY — PX: TEE WITHOUT CARDIOVERSION: SHX5443

## 2018-08-09 LAB — BASIC METABOLIC PANEL
Anion gap: 9 (ref 5–15)
BUN: 12 mg/dL (ref 6–20)
CALCIUM: 8.2 mg/dL — AB (ref 8.9–10.3)
CO2: 22 mmol/L (ref 22–32)
CREATININE: 1.15 mg/dL (ref 0.61–1.24)
Chloride: 105 mmol/L (ref 98–111)
GFR calc Af Amer: >60 mL/min (ref >60)
GLUCOSE: 95 mg/dL (ref 70–99)
POTASSIUM: 3.4 mmol/L — AB (ref 3.5–5.1)
SODIUM: 136 mmol/L (ref 135–145)

## 2018-08-09 LAB — MAGNESIUM: MAGNESIUM: 1.7 mg/dL (ref 1.7–2.4)

## 2018-08-09 SURGERY — ECHOCARDIOGRAM, TRANSESOPHAGEAL
Anesthesia: Monitor Anesthesia Care

## 2018-08-09 MED ORDER — ASPIRIN 325 MG PO TABS: 325.0000 mg | ORAL_TABLET | Freq: Every day | ORAL | 0 refills | Status: DC

## 2018-08-09 MED ORDER — HYDRALAZINE HCL 20 MG/ML IJ SOLN
10.0000 mg | Freq: Once | INTRAMUSCULAR | Status: AC
  Administered 2018-08-09: 10 mg via INTRAVENOUS
  Filled 2018-08-09: qty 1

## 2018-08-09 MED ORDER — CYANOCOBALAMIN 1000 MCG PO TABS: 1000.0000 ug | ORAL_TABLET | Freq: Every day | ORAL | 0 refills | Status: DC

## 2018-08-09 MED ORDER — LIDOCAINE 2% (20 MG/ML) 5 ML SYRINGE
INTRAMUSCULAR | Status: DC | PRN
  Administered 2018-08-09: 100 mg via INTRAVENOUS

## 2018-08-09 MED ORDER — ATORVASTATIN CALCIUM 40 MG PO TABS: 40.0000 mg | ORAL_TABLET | Freq: Every day | ORAL | 0 refills | Status: DC

## 2018-08-09 MED ORDER — SODIUM CHLORIDE 0.9 % IV SOLN
INTRAVENOUS | Status: AC | PRN
  Administered 2018-08-09: 500 mL via INTRAMUSCULAR

## 2018-08-09 MED ORDER — AMLODIPINE BESYLATE 10 MG PO TABS: 10.0000 mg | ORAL_TABLET | Freq: Every day | ORAL | 0 refills | Status: DC

## 2018-08-09 MED ORDER — PROPOFOL 10 MG/ML IV BOLUS
INTRAVENOUS | Status: DC | PRN
  Administered 2018-08-09: 40 mg via INTRAVENOUS
  Administered 2018-08-09: 20 mg via INTRAVENOUS
  Administered 2018-08-09: 40 mg via INTRAVENOUS
  Administered 2018-08-09: 30 mg via INTRAVENOUS
  Administered 2018-08-09: 20 mg via INTRAVENOUS

## 2018-08-09 MED ORDER — PROPOFOL 500 MG/50ML IV EMUL
INTRAVENOUS | Status: DC | PRN
  Administered 2018-08-09: 100 ug/kg/min via INTRAVENOUS

## 2018-08-09 NOTE — Anesthesia Preprocedure Evaluation (Addendum)
Anesthesia Evaluation  Patient identified by MRN, date of birth, ID band Patient awake    Reviewed: Allergy & Precautions, NPO status , Patient's Chart, lab work & pertinent test results  History of Anesthesia Complications Negative for: history of anesthetic complications  Airway Mallampati: II  TM Distance: >3 FB Neck ROM: Full    Dental  (+) Edentulous Upper, Edentulous Lower, Dental Advisory Given   Pulmonary Current Smoker,    breath sounds clear to auscultation       Cardiovascular hypertension, + Peripheral Vascular Disease   Rhythm:Regular Rate:Normal  TTE 08/06/18 EF 55-60%, no valvular abnormalities, possible PFO   Neuro/Psych CVA (CVA 8/819 without residual symptoms today) negative psych ROS   GI/Hepatic negative GI ROS, Neg liver ROS,   Endo/Other  negative endocrine ROS  Renal/GU negative Renal ROS  negative genitourinary   Musculoskeletal negative musculoskeletal ROS (+)   Abdominal   Peds negative pediatric ROS (+)  Hematology negative hematology ROS (+)   Anesthesia Other Findings   Reproductive/Obstetrics negative OB ROS                           Anesthesia Physical Anesthesia Plan  ASA: II  Anesthesia Plan: MAC   Post-op Pain Management:    Induction: Intravenous  PONV Risk Score and Plan: 0  Airway Management Planned: Simple Face Mask and Natural Airway  Additional Equipment:   Intra-op Plan:   Post-operative Plan:   Informed Consent: I have reviewed the patients History and Physical, chart, labs and discussed the procedure including the risks, benefits and alternatives for the proposed anesthesia with the patient or authorized representative who has indicated his/her understanding and acceptance.   Dental advisory given  Plan Discussed with: CRNA  Anesthesia Plan Comments:         Anesthesia Quick Evaluation

## 2018-08-09 NOTE — Interval H&P Note (Signed)
History and Physical Interval Note:  08/09/2018 8:31 AM  Brian Dixon  has presented today for surgery, with the diagnosis of stroke  The various methods of treatment have been discussed with the patient and family. After consideration of risks, benefits and other options for treatment, the patient has consented to  Procedure(s): TRANSESOPHAGEAL ECHOCARDIOGRAM (TEE) (N/A) as a surgical intervention .  The patient's history has been reviewed, patient examined, no change in status, stable for surgery.  I have reviewed the patient's chart and labs.  Questions were answered to the patient's satisfaction.     [name]

## 2018-08-09 NOTE — Transfer of Care (Signed)
Immediate Anesthesia Transfer of Care Note  Patient: Brian Dixon  Procedure(s) Performed: TRANSESOPHAGEAL ECHOCARDIOGRAM (TEE) (N/A )  Patient Location: Endoscopy Unit  Anesthesia Type:MAC  Level of Consciousness: drowsy  Airway & Oxygen Therapy: Patient Spontanous Breathing and Patient connected to nasal cannula oxygen  Post-op Assessment: Report given to RN and Post -op Vital signs reviewed and stable  Post vital signs: Reviewed and stable  Last Vitals:  Vitals Value Taken Time  BP    Temp    Pulse    Resp    SpO2      Last Pain:  Vitals:   08/09/18 0813  TempSrc: Oral  PainSc: 0-No pain      Patients Stated Pain Goal: 0 (33/29/51 [id number])  Complications: No apparent anesthesia complications

## 2018-08-09 NOTE — Progress Notes (Signed)
  Echocardiogram Echocardiogram Transesophageal has been performed.  Brian Dixon 08/09/2018, 9:42 AM

## 2018-08-09 NOTE — CV Procedure (Signed)
     Transesophageal Echocardiogram Note  Brian Dixon [id number] February 16, 1966  Procedure: Transesophageal Echocardiogram Indications: stroke  Procedure Details Consent: Obtained Time Out: Verified patient identification, verified procedure, site/side was marked, verified correct patient position, special equipment/implants available, Radiology Safety Procedures followed,  medications/allergies/relevent history reviewed, required imaging and test results available.  Performed  Medications: During this procedure the patient is administered iv propofol by anesthesia staff to achieve and maintain moderate conscious sedation.  The patient's heart rate, blood pressure, and oxygen saturation are monitored continuously during the procedure. The period of conscious sedation is 30 minutes, of which I was present face-to-face 100% of this time.  No intracardiac source of embolism.  Complications: No apparent complications Patient did tolerate procedure well.  [name], MD, [facility] 08/09/2018, 9:36 AM

## 2018-08-09 NOTE — Anesthesia Postprocedure Evaluation (Signed)
Anesthesia Post Note  Patient: Brian Dixon  Procedure(s) Performed: TRANSESOPHAGEAL ECHOCARDIOGRAM (TEE) (N/A )     Patient location during evaluation: PACU Anesthesia Type: MAC Level of consciousness: awake and alert Pain management: pain level controlled Vital Signs Assessment: post-procedure vital signs reviewed and stable Respiratory status: spontaneous breathing, nonlabored ventilation, respiratory function stable and patient connected to nasal cannula oxygen Cardiovascular status: stable and blood pressure returned to baseline Postop Assessment: no apparent nausea or vomiting Anesthetic complications: no    Last Vitals:  Vitals:   08/09/18 0940 08/09/18 1144  BP: (!) 167/77 (!) 162/99  Pulse: 75 64  Resp: 17 20  Temp:  36.6 C  SpO2: 100% 100%    Last Pain:  Vitals:   08/09/18 1144  TempSrc: Oral  PainSc:                  Rinnah Peppel L Electa Sterry
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
## 2018-08-09 NOTE — Progress Notes (Signed)
STROKE TEAM PROGRESS NOTE   SUBJECTIVE (INTERVAL HISTORY) No family is at the bedside. Had TEE today and showed no cardiac source of emboli.   OBJECTIVE Temp:  [97.9 F (36.6 C)-98.9 F (37.2 C)] 97.9 F (36.6 C) (08/12 1144) Pulse Rate:  [64-98] 64 (08/12 1144) Cardiac Rhythm: Normal sinus rhythm (08/12 0700) Resp:  [15-20] 20 (08/12 1144) BP: (129-167)/(72-100) 162/99 (08/12 1144) SpO2:  [99 %-100 %] 100 % (08/12 1144) Weight:  [78 kg-78.4 kg] 78 kg (08/12 0813)  CBC:  Recent Labs  Lab 08/05/18 1223 08/05/18 1345 08/06/18 0705  WBC 7.1  --  4.9  NEUTROABS 4.5  --   --   HGB 14.8 13.3 13.2  HCT 42.8 39.0 37.7*  MCV 104.9*  --  105.9*  PLT 92*  --  68*    Basic Metabolic Panel:  Recent Labs  Lab 08/07/18 0302 08/08/18 0357 08/09/18 0514  NA 136 134* 136  K 2.8* 2.9* 3.4*  CL 101 98 105  CO2 27 26 22   GLUCOSE 82 93 95  BUN 11 10 12   CREATININE 1.32* 1.34* 1.15  CALCIUM 7.3* 7.7* 8.2*  MG 1.6*  --  1.7    Lipid Panel:     Component Value Date/Time   CHOL 173 08/07/2018 0302   TRIG 97 08/07/2018 0302   HDL 54 08/07/2018 0302   CHOLHDL 3.2 08/07/2018 0302   VLDL 19 08/07/2018 0302   LDLCALC 100 (H) 08/07/2018 0302   HgbA1c:  Lab Results  Component Value Date   HGBA1C 4.7 (L) 08/07/2018   Urine Drug Screen:     Component Value Date/Time   LABOPIA NONE DETECTED 08/07/2018 1220   COCAINSCRNUR NONE DETECTED 08/07/2018 1220   LABBENZ NONE DETECTED 08/07/2018 1220   AMPHETMU NONE DETECTED 08/07/2018 1220   THCU NONE DETECTED 08/07/2018 1220   LABBARB NONE DETECTED 08/07/2018 1220    Alcohol Level     Component Value Date/Time   ETH <10 08/05/2018 1319    IMAGING I have personally reviewed the radiological images below and agree with the radiology interpretations.  Ct Head Wo Contrast Ct Cervical Spine Wo Contrast 08/05/2018 IMPRESSION:   CT head:  1. Atrophy with periventricular small vessel disease. No acute infarct. No evident acute  hemorrhage.  2. Mass in the anterior third ventricle with increased attenuation, not causing hydrocephalus. 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  3.  Foci of aortic vascular calcification noted.  4.  There are foci of paranasal sinus disease.   CT cervical spine:  1.  No fracture. Areas of slight spondylolisthesis at C5-6 and C6-7 are felt to be due to underlying spondylosis.  2.  Multilevel osteoarthritic change.  3.  Foci of carotid artery calcification bilaterally.   Ct Soft Tissue Neck W Contrast 08/05/2018 IMPRESSION:  1. 4.2 Cm mass in the right oral cavity and oropharynx involving the tonsillar fossa, glossotonsillar sulcus, and right floor of mouth. There is prominent degree of submucosal tumor in the right anterior and posterior tongue. 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 No adenopathy noted in the neck.  2. Findings of sialadenitis in the right submandibular gland, which contacts the mass. There is likely both postobstructive sialadenitis and direct tumor invasion.   Ct Chest W Contrast 08/05/2018 IMPRESSION:  Negative for acute finding of the chest.  Coronary artery disease of the left anterior descending.  Ct Angio Chest Pe W Or Wo Contrast 08/06/2018 IMPRESSION:  Negative for pulmonary emboli. Coronary artery disease.   Mr Brain Wo Contrast 08/05/2018 IMPRESSION:  1. Punctate focus of reduced diffusion within the left anterior inferior basal ganglia compatible with acute/early subacute infarction. No associated hemorrhage or mass effect.  2. No findings of cortical contusion, diffuse axonal injury, or intracranial hemorrhage.  3. Severe chronic microvascular ischemic changes and volume loss of the brain for age.  4. 9 mm colloid cyst.  No associated hydrocephalus.   US Abdomen Limited Ruq 08/05/2018 IMPRESSION:  1. 5 mm soft tissue focus within the contracted  gallbladder likely representing a small polyp.  2. No secondary signs of cholecystitis.  3. Echogenic liver parenchyma more commonly representing hepatic steatosis.   Transthoracic Echocardiogram  08/06/2018 Impressions: - Normal LV systolic function. No significant valvular disease.   Small echodense mobile target noted in LV, suspect that it is a   portion of mitral valve apparatus/small section of papillary   muscle, but cannot exclude other etiologies. Does not appear to   be causing significant mitral regurgitation.  Bilateral Carotid Dopplers 08/06/2018 Final Interpretation: Right Carotid: Velocities in the right ICA are consistent with a 1-39% stenosis. Left Carotid: Velocities in the left ICA are consistent with a 1-39% stenosis. Vertebrals: Bilateral vertebral arteries demonstrate antegrade flow.  TEE  08/09/2018 Study Conclusions  - Left ventricle: Systolic function was normal. The estimated   ejection fraction was in the range of 55% to 60%. Wall motion was   normal; there were no regional wall motion abnormalities. - Aortic valve: There was mild regurgitation. - Ascending aorta: The ascending aorta was normal in size. - Descending aorta: The descending aorta was normal in size. - Left atrium: No evidence of thrombus in the atrial cavity or   appendage. No evidence of thrombus in the atrial cavity or   appendage. - Right ventricle: Systolic function was normal. - Right atrium: No evidence of thrombus in the atrial cavity or   appendage. - Atrial septum: There was increased thickness of the septum,   consistent with lipomatous hypertrophy. No defect or patent   foramen ovale was identified. - Tricuspid valve: There was trivial regurgitation. Impressions: - No intracardiac source of embolism. Eustachian valve is seen in   the right atrium.   There is a highly mobile thin echodensity measuring 14 x 2 mm   attached to the anterior mitral chordae apparatus, this most    probably represents a partially ruptured chordae with no   significant mitral regurgitation.  EEG 08/06/2018 EEG Abnormalities: None Clinical Interpretation: This normal EEG is recorded in the waking state. There was no seizure or seizure predisposition recorded on this study. Please note that a normal EEG does not preclude the possibility of epilepsy.     PHYSICAL EXAM  Temp:  [97.9 F (36.6 C)-98.9 F (37.2 C)] 97.9 F (36.6 C) (08/12 1144) Pulse Rate:  [64-98] 64 (08/12 1144) Resp:  [15-20] 20 (08/12 1144) BP: (129-167)/(72-100) 162/99 (08/12 1144) SpO2:  [99 %-100 %] 100 % (08/12 1144) Weight:  [78 kg-78.4 kg] 78 kg (08/12 0813)  General - Well nourished, well developed, in no apparent distress.  Ophthalmologic - fundi not visualized due to noncooperation.  Cardiovascular - Regular rate and rhythm with no murmur.  Mental Status -  Level of arousal and orientation to time, place, and person were intact. Language including expression, naming, repetition, comprehension was assessed and found intact. Attention span and concentration were  normal. Fund of Knowledge was assessed and was intact.  Cranial Nerves II - XII - II - Visual field intact OU. III, IV, VI - Extraocular movements intact. V - Facial sensation intact bilaterally. VII - Facial movement intact bilaterally. VIII - Hearing & vestibular intact bilaterally. X - Palate elevates symmetrically. XI - Chin turning & shoulder shrug intact bilaterally. XII - Tongue protrusion intact.  Motor Strength - The patient's strength was normal in all extremities and pronator drift was absent.  Bulk was normal and fasciculations were absent.   Motor Tone - Muscle tone was assessed at the neck and appendages and was normal.  Reflexes - The patient's reflexes were symmetrical in all extremities and he had no pathological reflexes.  Sensory - Light touch, temperature/pinprick were assessed and were symmetrical.     Coordination - The patient had normal movements in the hands and feet with no ataxia or dysmetria.  Tremor was absent.  Gait and Station - normal gait and stance.   ASSESSMENT/PLAN Mr. Brian Dixon is a 52 y.o. male with history of tobacco heavy use1.5-2 ppdfor >35 yrs, HTN, alcohol binge drinking with binge drinking presenting with syncope. He did not receive IV t-PA due to no stroke like deficits.  Syncope  likely vasovagal syncope from nausea/dry hive related to heavy smoking and binge drinking  EEG normal  Now back to baseline  Educated on quit smoking and decrease drinking to < 2 drink per day  EF normal  EKG sinus tachy   No driving until episodes free for 6 months  Incidental stroke:  small left basal ganglia infarct - likely small vessel disease given significant risk factors.  Resultant  Back to baseline  CT head - Atrophy with periventricular small vessel disease.  MRI head - Punctate focus of reduced diffusion within the left anterior inferior basal ganglia compatible with acute/early subacute infarction.  MRA head - left A1 severe stenosis  EEG - normal  Carotid Doppler - normal  2D Echo - NL LV systolic fx EF 35-32% - Small echodense mobile target in MV  TEE - EF 55-60%. No intracardiac source of embolism although this is a highly mobile thin echodensity attached to the anterior mitral chordae apparatus, this most probably represents a partially ruptured chordae, which cardiology seems not considered as source of concern   LDL - 100  HgbA1c - 4.7  VTE prophylaxis - Lovenox  No antithrombotic prior to admission, now on aspirin 325 mg daily  Patient counseled to be compliant with his antithrombotic medications  Ongoing aggressive stroke risk factor management  Therapy recommendations: none  Disposition:  Pending  Hypertension . BP tends to run high  . On amlodipine 10 and lisinopril 20mg   . 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 Please call with questions. Pt will follow up with stroke clinic NP at [facility] in about 4 weeks. Thanks for the consult.  Rosalin Hawking, MD PhD Stroke Neurology 08/09/2018 4:38 PM  To contact Stroke Continuity provider, please refer to [email or web address] After hours, contact General Neurology

## 2018-08-10 ENCOUNTER — Encounter (HOSPITAL_COMMUNITY): Payer: Self-pay | Admitting: Cardiology

## 2018-08-24 ENCOUNTER — Ambulatory Visit (INDEPENDENT_AMBULATORY_CARE_PROVIDER_SITE_OTHER): Payer: Medicaid Other | Admitting: Family Medicine

## 2018-08-24 ENCOUNTER — Encounter: Payer: Self-pay | Admitting: Family Medicine

## 2018-08-24 VITALS — BP 158/94 | HR 100 | Temp 98.1°F | Ht 66.0 in | Wt 183.0 lb

## 2018-08-24 DIAGNOSIS — I10 Essential (primary) hypertension: Secondary | ICD-10-CM

## 2018-08-24 DIAGNOSIS — R05 Cough: Secondary | ICD-10-CM

## 2018-08-24 DIAGNOSIS — Z8673 Personal history of transient ischemic attack (TIA), and cerebral infarction without residual deficits: Secondary | ICD-10-CM | POA: Diagnosis not present

## 2018-08-24 DIAGNOSIS — J387 Other diseases of larynx: Secondary | ICD-10-CM | POA: Diagnosis not present

## 2018-08-24 DIAGNOSIS — Z0000 Encounter for general adult medical examination without abnormal findings: Secondary | ICD-10-CM

## 2018-08-24 DIAGNOSIS — Z720 Tobacco use: Secondary | ICD-10-CM | POA: Diagnosis not present

## 2018-08-24 DIAGNOSIS — R599 Enlarged lymph nodes, unspecified: Secondary | ICD-10-CM

## 2018-08-24 DIAGNOSIS — Z7689 Persons encountering health services in other specified circumstances: Secondary | ICD-10-CM

## 2018-08-24 DIAGNOSIS — Z716 Tobacco abuse counseling: Secondary | ICD-10-CM | POA: Diagnosis not present

## 2018-08-24 DIAGNOSIS — Z09 Encounter for follow-up examination after completed treatment for conditions other than malignant neoplasm: Secondary | ICD-10-CM | POA: Diagnosis not present

## 2018-08-24 DIAGNOSIS — R059 Cough, unspecified: Secondary | ICD-10-CM

## 2018-08-24 DIAGNOSIS — E785 Hyperlipidemia, unspecified: Secondary | ICD-10-CM

## 2018-08-24 LAB — POCT URINALYSIS DIP (MANUAL ENTRY)
Bilirubin, UA: NEGATIVE
Blood, UA: NEGATIVE
Glucose, UA: NEGATIVE mg/dL
Ketones, POC UA: NEGATIVE mg/dL
Leukocytes, UA: NEGATIVE
Nitrite, UA: NEGATIVE
Protein Ur, POC: 100 mg/dL — AB
Spec Grav, UA: 1.025 (ref 1.010–1.025)
Urobilinogen, UA: 0.2 E.U./dL
pH, UA: 5.5 (ref 5.0–8.0)

## 2018-08-24 MED ORDER — CYANOCOBALAMIN 1000 MCG PO TABS: 1000.0000 ug | ORAL_TABLET | Freq: Every day | ORAL | 2 refills | Status: DC

## 2018-08-24 MED ORDER — AMLODIPINE BESYLATE 10 MG PO TABS: 10.0000 mg | ORAL_TABLET | Freq: Every day | ORAL | 2 refills | Status: DC

## 2018-08-24 MED ORDER — LISINOPRIL-HYDROCHLOROTHIAZIDE 20-25 MG PO TABS: 1.0000 | ORAL_TABLET | Freq: Every day | ORAL | 3 refills | Status: DC

## 2018-08-24 MED ORDER — GUAIFENESIN ER 600 MG PO TB12: 600.0000 mg | ORAL_TABLET | Freq: Two times a day (BID) | ORAL | 3 refills | Status: DC

## 2018-08-24 MED ORDER — ATORVASTATIN CALCIUM 40 MG PO TABS: 40.0000 mg | ORAL_TABLET | Freq: Every day | ORAL | 2 refills | Status: DC

## 2018-08-24 MED ORDER — ASPIRIN 325 MG PO TABS: 325.0000 mg | ORAL_TABLET | Freq: Every day | ORAL | 2 refills | Status: DC

## 2018-08-24 MED FILL — ATORVASTATIN CALCIUM 40 MG: 40 | 30 days supply | Qty: 30 | Fill #0

## 2018-08-24 MED FILL — AMLODIPINE BESYLATE 10 MG T: 10 | 30 days supply | Qty: 30 | Fill #0
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
## 2018-08-24 NOTE — Patient Instructions (Addendum)
Guaifenesin oral ER tablets What is this medicine? GUAIFENESIN (gwye FEN e sin) is an expectorant. It helps to thin mucous and make coughs more productive. This medicine is used to treat coughs caused by colds or the flu. It is not intended to treat chronic cough caused by smoking, asthma, emphysema, or heart failure. This medicine may be used for other purposes; ask your health care provider or pharmacist if you have questions. COMMON BRAND NAME(S): Humibid, Mucinex What should I tell my health care provider before I take this medicine? They need to know if you have any of these conditions: -fever -kidney disease -an unusual or allergic reaction to guaifenesin, other medicines, foods, dyes, or preservatives -pregnant or trying to get pregnant -breast-feeding How should I use this medicine? Take this medicine by mouth with a full glass of water. Follow the directions on the prescription label. Do not break, chew or crush this medicine. You may take with food or on an empty stomach. Take your medicine at regular intervals. Do not take your medicine more often than directed. Talk to your pediatrician regarding the use of this medicine in children. While this drug may be prescribed for children as young as 56 years old for selected conditions, precautions do apply. Overdosage: If you think you have taken too much of this medicine contact a poison control center or emergency room at once. NOTE: This medicine is only for you. Do not share this medicine with others. What if I miss a dose? If you miss a dose, take it as soon as you can. If it is almost time for your next dose, take only that dose. Do not take double or extra doses. What may interact with this medicine? Interactions are not expected. This list may not describe all possible interactions. Give your health care provider a list of all the medicines, herbs, non-prescription drugs, or dietary supplements you use. Also tell them if you smoke,  drink alcohol, or use illegal drugs. Some items may interact with your medicine. What should I watch for while using this medicine? Do not treat a cough for more than 1 week without consulting your doctor or health care professional. If you also have a high fever, skin rash, continuing headache, or sore throat, see your doctor. For best results, drink 6 to 8 glasses water daily while you are taking this medicine. What side effects may I notice from receiving this medicine? Side effects that you should report to your doctor or health care professional as soon as possible: -allergic reactions like skin rash, itching or hives, swelling of the face, lips, or tongue Side effects that usually do not require medical attention (report to your doctor or health care professional if they continue or are bothersome): -dizziness -headache -stomach upset This list may not describe all possible side effects. Call your doctor for medical advice about side effects. You may report side effects to FDA at 1-800-FDA-1088. Where should I keep my medicine? Keep out of the reach of children. Store at room temperature between 20 and 25 degrees C (68 and 77 degrees F). Keep container tightly closed. Throw away any unused medicine after the expiration date. NOTE: This sheet is a summary. It may not cover all possible information. If you have questions about this medicine, talk to your doctor, pharmacist, or health care provider.  2018 Elsevier/Gold Standard (2008-04-26 12:14:14) Hydrochlorothiazide, HCTZ; Lisinopril tablets What is this medicine? HYDROCHLOROTHIAZIDE; LISINOPRIL (hye droe klor oh THYE a zide; lyse IN oh pril) is  a combination of a diuretic and an ACE inhibitor. It is used to treat high blood pressure. This medicine may be used for other purposes; ask your health care provider or pharmacist if you have questions. COMMON BRAND NAME(S): Prinzide, Zestoretic What should I tell my health care provider before  I take this medicine? They need to know if you have any of these conditions: -bone marrow disease -decreased urine -heart or blood vessel disease -if you are on a special diet like a low salt diet -immune system problems, like lupus -kidney disease -liver disease -previous swelling of the tongue, face, or lips with difficulty breathing, difficulty swallowing, hoarseness, or tightening of the throat -recent heart attack or stroke -an unusual or allergic reaction to lisinopril, hydrochlorothiazide, sulfa drugs, other medicines, insect venom, foods, dyes, or preservatives -pregnant or trying to get pregnant -breast-feeding How should I use this medicine? Take this medicine by mouth with a glass of water. Follow the directions on the prescription label. You can take it with or without food. If it upsets your stomach, take it with food. Take your medicine at regular intervals. Do not take it more often than directed. Do not stop taking except on your doctor's advice. Talk to your pediatrician regarding the use of this medicine in children. Special care may be needed. Overdosage: If you think you have taken too much of this medicine contact a poison control center or emergency room at once. NOTE: This medicine is only for you. Do not share this medicine with others. What if I miss a dose? If you miss a dose, take it as soon as you can. If it is almost time for your next dose, take only that dose. Do not take double or extra doses. What may interact with this medicine? Do not take this medication with any of the following medications: -sacubitril; valsartan This medicine may also interact with the following: -barbiturates like phenobarbital -blood pressure medicines -corticosteroids like prednisone -diabetic medications -diuretics, especially triamterene, spironolactone or amiloride -lithium -NSAIDs, medicines for pain and inflammation, like ibuprofen or naproxen -potassium salts or  potassium supplements -prescription pain medicines -skeletal muscle relaxants like tubocurarine -some cholesterol lowering medications like cholestyramine or colestipol This list may not describe all possible interactions. Give your health care provider a list of all the medicines, herbs, non-prescription drugs, or dietary supplements you use. Also tell them if you smoke, drink alcohol, or use illegal drugs. Some items may interact with your medicine. What should I watch for while using this medicine? Visit your doctor or health care professional for regular checks on your progress. Check your blood pressure as directed. Ask your doctor or health care professional what your blood pressure should be and when you should contact him or her. Call your doctor or health care professional if you notice an irregular or fast heart beat. You must not get dehydrated. Ask your doctor or health care professional how much fluid you need to drink a day. Check with him or her if you get an attack of severe diarrhea, nausea and vomiting, or if you sweat a lot. The loss of too much body fluid can make it dangerous for you to take this medicine. Women should inform their doctor if they wish to become pregnant or think they might be pregnant. There is a potential for serious side effects to an unborn child. Talk to your health care professional or pharmacist for more information. You may get drowsy or dizzy. Do not drive, use machinery, or  do anything that needs mental alertness until you know how this drug affects you. Do not stand or sit up quickly, especially if you are an older patient. This reduces the risk of dizzy or fainting spells. Alcohol can make you more drowsy and dizzy. Avoid alcoholic drinks. This medicine may affect your blood sugar level. If you have diabetes, check with your doctor or health care professional before changing the dose of your diabetic medicine. Avoid salt substitutes unless you are told  otherwise by your doctor or health care professional. This medicine can make you more sensitive to the sun. Keep out of the sun. If you cannot avoid being in the sun, wear protective clothing and use sunscreen. Do not use sun lamps or tanning beds/booths. Do not treat yourself for coughs, colds, or pain while you are taking this medicine without asking your doctor or health care professional for advice. Some ingredients may increase your blood pressure. What side effects may I notice from receiving this medicine? Side effects that you should report to your doctor or health care professional as soon as possible: -changes in vision -confusion, dizziness, light headedness or fainting spells -decreased amount of urine passed -difficulty breathing or swallowing, hoarseness, or tightening of the throat -eye pain -fast or irregular heart beat, palpitations, or chest pain -muscle cramps -nausea and vomiting -persistent dry cough -redness, blistering, peeling or loosening of the skin, including inside the mouth -stomach pain -swelling of your face, lips, tongue, hands, or feet -unusual rash, bleeding or bruising, or pinpoint red spots on the skin -worsened gout pain -yellowing of the eyes or skin Side effects that usually do not require medical attention (report to your doctor or health care professional if they continue or are bothersome): -change in sex drive or performance -cough -headache This list may not describe all possible side effects. Call your doctor for medical advice about side effects. You may report side effects to FDA at 1-800-FDA-1088. Where should I keep my medicine? Keep out of the reach of children. Store at room temperature between 20 and 25 degrees C (68 and 77 degrees F). Protect from moisture and excessive light. Keep container tightly closed. Throw away any unused medicine after the expiration date. NOTE: This sheet is a summary. It may not cover all possible information.  If you have questions about this medicine, talk to your doctor, pharmacist, or health care provider.  2018 Elsevier/Gold Standard (2016-02-08 11:42:20)

## 2018-08-25 ENCOUNTER — Other Ambulatory Visit (HOSPITAL_COMMUNITY): Payer: Self-pay | Admitting: Otolaryngology

## 2018-08-25 ENCOUNTER — Telehealth: Payer: Self-pay

## 2018-08-25 DIAGNOSIS — C029 Malignant neoplasm of tongue, unspecified: Secondary | ICD-10-CM

## 2018-08-25 LAB — TSH: TSH: 4.6 u[IU]/mL — ABNORMAL HIGH (ref 0.450–4.500)

## 2018-08-25 LAB — COMPREHENSIVE METABOLIC PANEL
ALT: 17 IU/L (ref 0–44)
AST: 17 IU/L (ref 0–40)
Albumin/Globulin Ratio: 1.2 (ref 1.2–2.2)
Albumin: 4.4 g/dL (ref 3.5–5.5)
Alkaline Phosphatase: 80 IU/L (ref 39–117)
BUN/Creatinine Ratio: 9 (ref 9–20)
BUN: 14 mg/dL (ref 6–24)
Bilirubin Total: 0.4 mg/dL (ref 0.0–1.2)
CO2: 28 mmol/L (ref 20–29)
Calcium: 10.4 mg/dL — ABNORMAL HIGH (ref 8.7–10.2)
Chloride: 93 mmol/L — ABNORMAL LOW (ref 96–106)
Creatinine, Ser: 1.48 mg/dL — ABNORMAL HIGH (ref 0.76–1.27)
GFR calc Af Amer: 62 mL/min/{1.73_m2} (ref >59)
GFR calc non Af Amer: 54 mL/min/{1.73_m2} — ABNORMAL LOW (ref >59)
Globulin, Total: 3.8 g/dL (ref 1.5–4.5)
Glucose: 106 mg/dL — ABNORMAL HIGH (ref 65–99)
Potassium: 3.8 mmol/L (ref 3.5–5.2)
Sodium: 137 mmol/L (ref 134–144)
Total Protein: 8.2 g/dL (ref 6.0–8.5)

## 2018-08-25 LAB — CBC WITH DIFFERENTIAL/PLATELET
Basophils Absolute: 0.2 10*3/uL (ref 0.0–0.2)
Basos: 2 %
EOS (ABSOLUTE): 0.4 10*3/uL (ref 0.0–0.4)
Eos: 5 %
Hematocrit: 42.2 % (ref 37.5–51.0)
Hemoglobin: 14.2 g/dL (ref 13.0–17.7)
Immature Grans (Abs): 0 10*3/uL (ref 0.0–0.1)
Immature Granulocytes: 1 %
Lymphocytes Absolute: 2 10*3/uL (ref 0.7–3.1)
Lymphs: 25 %
MCH: 34.2 pg — ABNORMAL HIGH (ref 26.6–33.0)
MCHC: 33.6 g/dL (ref 31.5–35.7)
MCV: 102 fL — ABNORMAL HIGH (ref 79–97)
Monocytes Absolute: 0.8 10*3/uL (ref 0.1–0.9)
Monocytes: 9 %
Neutrophils Absolute: 4.7 10*3/uL (ref 1.4–7.0)
Neutrophils: 58 %
Platelets: 291 10*3/uL (ref 150–450)
RBC: 4.15 x10E6/uL (ref 4.14–5.80)
RDW: 11.9 % — ABNORMAL LOW (ref 12.3–15.4)
WBC: 8 10*3/uL (ref 3.4–10.8)

## 2018-08-25 LAB — PSA: Prostate Specific Ag, Serum: 0.5 ng/mL (ref 0.0–4.0)

## 2018-08-25 MED FILL — LISINOPRIL-HCTZ 20-25 MG TA: 20-25 | 30 days supply | Qty: 30 | Fill #0

## 2018-08-25 NOTE — Telephone Encounter (Signed)
Patient notified

## 2018-08-25 NOTE — Telephone Encounter (Signed)
Left a vm for patient to callback 

## 2018-08-25 NOTE — Telephone Encounter (Signed)
-----   Message from Azzie Glatter, [place] sent at 08/24/2018  9:49 PM EDT ----- Regarding: "High Blood Pressure" Brian Dixon,   Please call patient and let him know that a new Rx for Lisinopril-HCTZ to pharmacy today. He should take as directed. He will keep his follow up appointment in 1 month.   Thanks!

## 2018-08-25 NOTE — Telephone Encounter (Signed)
-----   Message from Azzie Glatter, [place] sent at 08/24/2018  9:49 PM EDT ----- Regarding: "High Blood Pressure" Brian Dixon,   Please call patient and let him know that a new Rx for Lisinopril-HCTZ to pharmacy today. He should take as directed. He will keep his follow up appointment in 1 month.   Thanks!

## 2018-08-26 ENCOUNTER — Telehealth: Payer: Self-pay | Admitting: *Deleted

## 2018-08-26 ENCOUNTER — Encounter: Payer: Self-pay | Admitting: Radiation Oncology

## 2018-08-27 ENCOUNTER — Other Ambulatory Visit: Payer: Self-pay | Admitting: Otolaryngology

## 2018-08-31 ENCOUNTER — Telehealth: Payer: Self-pay | Admitting: *Deleted

## 2018-08-31 ENCOUNTER — Other Ambulatory Visit: Payer: Self-pay | Admitting: *Deleted

## 2018-08-31 DIAGNOSIS — C01 Malignant neoplasm of base of tongue: Secondary | ICD-10-CM
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
## 2018-08-31 NOTE — Telephone Encounter (Signed)
Oncology Nurse Navigator Documentation  Called Mr. Riemenschneider to inform of 9/10 0830/0900 with Dr. Isidore Moos, Radiation Oncology; 9/16 0900 with Dr. Maylon Peppers, Medical Oncology.   Reviewed 9/6 0700 PET at [facility], NPO status beginning midnight.   He voiced understanding of appt information, I encouraged him to call me with questions/concerns prior to appts.  [name], RN, BSN Head & Neck Oncology Nurse [place] at [place] [phone]

## 2018-08-31 NOTE — Telephone Encounter (Signed)
Oncology Nurse Navigator Documentation  Placed introductory call to new referral patient Mr. Henrichs.  Introduced myself as the H&N oncology nurse navigator that works with Dr. Isidore Moos to whom he has been referred by Dr. Redmond Baseman.  He confirmed understanding of referral, voiced understanding I will be contacting him with an appt to see Dr. Isidore Moos as well as physician in Medical Oncology.   I briefly explained my role as his navigator, indicated I would be joining him during his appts.  I confirmed his understanding of [place] location, explained arrival and RadOnc registration process for appt.  I provided my contact information, encouraged him to call with questions/concerns.   He verbalized understanding of information provided.   [name], RN, BSN Head & Neck Oncology Nurse [place] at [place] [phone]

## 2018-09-01 ENCOUNTER — Encounter: Payer: Self-pay | Admitting: Hematology

## 2018-09-01 ENCOUNTER — Telehealth: Payer: Self-pay | Admitting: Hematology

## 2018-09-01 ENCOUNTER — Encounter: Payer: Self-pay | Admitting: Radiation Oncology

## 2018-09-01 ENCOUNTER — Ambulatory Visit: Payer: Self-pay | Attending: Family Medicine

## 2018-09-01 NOTE — Telephone Encounter (Signed)
Pt has been scheduled to see Dr. Maylon Peppers on 9/16 at [place], [place] navigator has notified the pt of the appt date and time. Letter mailed.

## 2018-09-03 ENCOUNTER — Encounter (HOSPITAL_COMMUNITY)
Admission: RE | Admit: 2018-09-03 | Discharge: 2018-09-03 | Disposition: A | Discharge status: Home / Self Care | Payer: Medicaid Other | Source: Ambulatory Visit | Attending: Otolaryngology | Admitting: Otolaryngology

## 2018-09-03 DIAGNOSIS — C029 Malignant neoplasm of tongue, unspecified: Secondary | ICD-10-CM | POA: Diagnosis not present

## 2018-09-03 LAB — GLUCOSE, CAPILLARY: Glucose-Capillary: 110 mg/dL — ABNORMAL HIGH (ref 70–99)

## 2018-09-03 MED ORDER — FLUDEOXYGLUCOSE F - 18 (FDG) INJECTION
8.9600 | Freq: Once | INTRAVENOUS | Status: AC
  Administered 2018-09-03: 8.96 via INTRAVENOUS

## 2018-09-07 ENCOUNTER — Encounter: Payer: Self-pay | Admitting: Radiation Oncology

## 2018-09-07 ENCOUNTER — Other Ambulatory Visit: Payer: Self-pay

## 2018-09-07 ENCOUNTER — Encounter: Payer: Self-pay | Admitting: *Deleted

## 2018-09-07 ENCOUNTER — Ambulatory Visit
Admission: RE | Admit: 2018-09-07 | Discharge: 2018-09-07 | Disposition: A | Discharge status: Home / Self Care | Payer: Medicaid Other | Source: Ambulatory Visit | Attending: Radiation Oncology | Admitting: Radiation Oncology

## 2018-09-07 VITALS — BP 121/82 | HR 96 | Temp 98.4°F | Resp 20 | Ht 67.0 in | Wt 185.4 lb

## 2018-09-07 DIAGNOSIS — F1010 Alcohol abuse, uncomplicated: Secondary | ICD-10-CM | POA: Insufficient documentation

## 2018-09-07 DIAGNOSIS — Z79899 Other long term (current) drug therapy: Secondary | ICD-10-CM | POA: Diagnosis not present

## 2018-09-07 DIAGNOSIS — E7800 Pure hypercholesterolemia, unspecified: Secondary | ICD-10-CM | POA: Insufficient documentation

## 2018-09-07 DIAGNOSIS — C01 Malignant neoplasm of base of tongue: Secondary | ICD-10-CM

## 2018-09-07 DIAGNOSIS — R05 Cough: Secondary | ICD-10-CM | POA: Diagnosis not present

## 2018-09-07 DIAGNOSIS — F17210 Nicotine dependence, cigarettes, uncomplicated: Secondary | ICD-10-CM | POA: Diagnosis not present

## 2018-09-07 DIAGNOSIS — Z7982 Long term (current) use of aspirin: Secondary | ICD-10-CM | POA: Diagnosis not present

## 2018-09-07 DIAGNOSIS — Z8673 Personal history of transient ischemic attack (TIA), and cerebral infarction without residual deficits: Secondary | ICD-10-CM | POA: Insufficient documentation

## 2018-09-07 DIAGNOSIS — Z85810 Personal history of malignant neoplasm of tongue: Secondary | ICD-10-CM | POA: Diagnosis not present

## 2018-09-07 DIAGNOSIS — C090 Malignant neoplasm of tonsillar fossa: Secondary | ICD-10-CM

## 2018-09-07 DIAGNOSIS — I10 Essential (primary) hypertension: Secondary | ICD-10-CM | POA: Diagnosis not present

## 2018-09-07 HISTORY — DX: Pure hypercholesterolemia, unspecified: E78.00
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
## 2018-09-08 NOTE — Progress Notes (Deleted)
Hematology/Oncology         [phone]) [phone]  Consult Note  Patient Care Team: Azzie Glatter, FNP as PCP - General (Family Medicine) Melida Quitter, MD as Consulting Physician (Otolaryngology) Eppie Gibson, MD as Attending Physician (Radiation Oncology) Tish Men, MD as Consulting Physician (Hematology) Leota Sauers, RN as Oncology Nurse Navigator  CHIEF COMPLAINTS/PURPOSE OF CONSULTATION:  ***  HISTORY OF PRESENTING ILLNESS:  Brian Dixon 52 y.o. male is here because of newly diagnosed  According to the patient, the first initial presentation was due to *** he denies any hearing deficit, difficulties with chewing food, swallowing difficulties, painful swallowing, changes in the quality of his voice or abnormal weight loss. On *** , he underwent ENT evaluation and was found to have *** On ***, he underwent imaging study with ***CT/PET scan which showed locally advanced disease On ***, pathology from biopsy of *** confirmed diagnosis of squamous cell carcinoma, ***well/poorly differentiated, HPV {FINDINGS; POSITIVE NEGATIVE:[phone]}  MEDICAL HISTORY:  Past Medical History:  Diagnosis Date  . High cholesterol   . Hx of tongue cancer   . Hypertension     SURGICAL HISTORY: Past Surgical History:  Procedure Laterality Date  . BACK SURGERY  09/19/1997   ruptured disc   . HAND SURGERY Right   . MOUTH SURGERY    . TEE WITHOUT CARDIOVERSION N/A 08/09/2018   Procedure: TRANSESOPHAGEAL ECHOCARDIOGRAM (TEE);  Surgeon: Dorothy Spark, MD;  Location: [facility] ENDOSCOPY;  Service: Cardiovascular;  Laterality: N/A;  Bubble study  . WISDOM TOOTH EXTRACTION      SOCIAL HISTORY: Social History   Socioeconomic History  . Marital status: Divorced    Spouse name: Not on file  . Number of children: Not on file  . Years of education: Not on file  . Highest education level: Not on file  Occupational History  . Not on file  Social Needs  . Financial resource strain: Not on  file  . Food insecurity:    Worry: Not on file    Inability: Not on file  . Transportation needs:    Medical: Not on file    Non-medical: Not on file  Tobacco Use  . Smoking status: Current Every Day Smoker    Packs/day: 2.00    Years: 35.00    Pack years: 70.00    Types: Cigarettes  . Smokeless tobacco: Former Systems developer  . Tobacco comment: he decreased his smoking to 1/2 pack daily on 8/8/190  Substance and Sexual Activity  . Alcohol use: Yes    Comment: 1/2 gallon liquor weekly, none since 08/05/18  . Drug use: Not Currently  . Sexual activity: Not on file  Lifestyle  . Physical activity:    Days per week: Not on file    Minutes per session: Not on file  . Stress: Not on file  Relationships  . Social connections:    Talks on phone: Not on file    Gets together: Not on file    Attends religious service: Not on file    Active member of club or organization: Not on file    Attends meetings of clubs or organizations: Not on file    Relationship status: Not on file  . Intimate partner violence:    Fear of current or ex partner: No    Emotionally abused: No    Physically abused: No    Forced sexual activity: No  Other Topics Concern  . Not on file  Social History Narrative  . Not  on file    FAMILY HISTORY: Family History  Problem Relation Age of Onset  . Hypertension Mother   . Prostate cancer Father     ALLERGIES:  has No Known Allergies.  MEDICATIONS:  Current Outpatient Medications  Medication Sig Dispense Refill  . amLODipine (NORVASC) 10 MG tablet Take 1 tablet (10 mg total) by mouth daily. 30 tablet 2  . aspirin 325 MG tablet Take 1 tablet (325 mg total) by mouth daily. 30 tablet 2  . atorvastatin (LIPITOR) 40 MG tablet Take 1 tablet (40 mg total) by mouth daily at 6 PM. 30 tablet 2  . cyanocobalamin 1000 MCG tablet Take 1 tablet (1,000 mcg total) by mouth daily. 30 tablet 2  . guaiFENesin (MUCINEX) 600 MG 12 hr tablet Take 1 tablet (600 mg total) by mouth 2  (two) times daily. 60 tablet 3  . lisinopril-hydrochlorothiazide (PRINZIDE,ZESTORETIC) 20-25 MG tablet Take 1 tablet by mouth daily. 30 tablet 3   No current facility-administered medications for this visit.     REVIEW OF SYSTEMS:   Constitutional: Denies fevers, chills or abnormal night sweats Eyes: Denies blurriness of vision, double vision or watery eyes Ears, nose, mouth, throat, and face: Denies mucositis or sore throat Respiratory: Denies cough, dyspnea or wheezes Cardiovascular: Denies palpitation, chest discomfort or lower extremity swelling Gastrointestinal:  Denies nausea, heartburn or change in bowel habits Skin: Denies abnormal skin rashes Lymphatics: Denies new lymphadenopathy or easy bruising Neurological:Denies numbness, tingling or new weaknesses Behavioral/Psych: Mood is stable, no new changes  All other systems were reviewed with the patient and are negative.  PHYSICAL EXAMINATION: ECOG PERFORMANCE STATUS: {CHL ONC ECOG [phone]}  There were no vitals filed for this visit. There were no vitals filed for this visit.  GENERAL:alert, no distress and comfortable SKIN: skin color, texture, turgor are normal, no rashes or significant lesions EYES: normal, conjunctiva are pink and non-injected, sclera clear OROPHARYNX:no exudate, no erythema and lips, buccal mucosa, and tongue normal  NECK: supple, thyroid normal size, non-tender, without nodularity LYMPH:  no palpable lymphadenopathy in the cervical, axillary or inguinal LUNGS: clear to auscultation and percussion with normal breathing effort HEART: regular rate & rhythm and no murmurs and no lower extremity edema ABDOMEN:abdomen soft, non-tender and normal bowel sounds Musculoskeletal:no cyanosis of digits and no clubbing  PSYCH: alert & oriented x 3 with fluent speech NEURO: no focal motor/sensory deficits  LABORATORY DATA:  I have reviewed the data as listed Lab Results  Component Value Date   WBC 8.0  08/24/2018   HGB 14.2 08/24/2018   HCT 42.2 08/24/2018   MCV 102 (H) 08/24/2018   PLT 291 08/24/2018   Lab Results  Component Value Date   NA 137 08/24/2018   K 3.8 08/24/2018   CL 93 (L) 08/24/2018   CO2 28 08/24/2018    RADIOGRAPHIC STUDIES: I have personally reviewed the radiological images as listed and agreed with the findings in the report. Nm Pet Image Initial (pi) Skull Base To Thigh  Result Date: 09/03/2018 CLINICAL DATA:  Initial treatment strategy for tongue cancer. EXAM: NUCLEAR MEDICINE PET SKULL BASE TO THIGH TECHNIQUE: 8.96 mCi F-18 FDG was injected intravenously. Full-ring PET imaging was performed from the skull base to thigh after the radiotracer. CT data was obtained and used for attenuation correction and anatomic localization. Fasting blood glucose: 110 mg/dl COMPARISON:  CT of the soft tissues of neck 08/05/2018 FINDINGS: Mediastinal blood pool activity: SUV max 3.13 NECK: There is intense FDG uptake associated with  the right oral cavity and right oropharynx mass which involves the right base of tongue, tonsillar fossa, glossal tonsillar sulcus and right floor of mouth. SUV max is equal to 26.33. no hypermetabolic cervical lymph nodes. Incidental CT findings: none CHEST: No hypermetabolic mediastinal or hilar nodes. No suspicious pulmonary nodules on the CT scan. Incidental CT findings: Calcification in the LAD coronary artery identified. ABDOMEN/PELVIS: No abnormal uptake identified within the liver, pancreas, adrenal glands, or spleen. No hypermetabolic abdominopelvic lymph nodes. Incidental CT findings: Right kidney cyst noted, incompletely characterized without IV contrast. SKELETON: No focal hypermetabolic activity to suggest skeletal metastasis. Incidental CT findings: none IMPRESSION: 1. There is intense FDG uptake associated with the right oral cavity and right oropharynx mass within SUV max of 23.8. No hypermetabolic cervical lymph nodes and no evidence for distant  metastatic disease. 2. Lad coronary artery atherosclerotic calcifications noted. Electronically Signed   By: Kerby Moors M.D.   On: 09/03/2018 11:57    ASSESSMENT:  Newly diagnosed squamous cell carcinoma of the Head & Neck, HPV {FINDINGS; POSITIVE [phone]}  PLAN:  *** Stage of the disease is to be determined, a PET/CT scan has been ordered.   Prognosis would depend on the results for the PET/CT scan, to be discussed and reviewed in the next visit.   Treatment options would include chemotherapy only, radiation only or chemotherapy in combination with radiation therapy.    In preparation for treatment, the patient will need the following tests or referrals, to be arranged #1 Referral to Radiation Oncology for consultation.  #2 Referral to dentist for full dental evaluation and possible dental extraction  #3 PET/CT scan.  #4 Possible referral for feeding tube placement.  #5 Possible Infusaport placement #6 Referral to Speech Pathologist  #7 Referral to Nutritionist  #8 Referral to Social Worker #9 Possible Referral to chemotherapy class to learn about practical tips while on treatment.  #10 Blood work   No orders of the defined types were placed in this encounter.   All questions were answered. The patient knows to call the clinic with any problems, questions or concerns. I spent {CHL ONC TIME VISIT - HLKTG:[id number]} counseling the patient face to face. The total time spent in the appointment was {CHL ONC TIME VISIT - KAJGO:[id number]} and more than 50% was on counseling.     Tish Men, MD 09/08/18 11:37 AM

## 2018-09-08 NOTE — Progress Notes (Signed)
Oncology Nurse Navigator Documentation  Met with Mr. Karaffa during initial consult with Dr. Isidore Moos.  He was accompanied by his parents.   . Further introduced myself as his Navigator, explained my role as a member of the Care Team.   . Provided New Patient Information packet, discussed contents: o Contact information for physician(s), myself, other members of the Care Team. o Advance Directive information ([place] blue pamphlet with LCSW contact info) o Fall Prevention Patient Safety Plan o Appointment Guideline o [place] [facility] map with highlight of [place] o SLP information sheet o Sympt[facility] Management Clinic information . Provided introductory explanation of radiation treatment including SIM planning and purpose of Aquaplast head and shoulder mask, showed them example.   . They voiced understanding: . He may be surgical candidate, agreed to [facility] referral for discussion. Marland Kitchen Potential plan for chemoRT if chooses not to have surgery.   Appt 0900 9/18 with Dr. Maylon Peppers, [facility] at which I will join them. I encouraged them to contact me with questions/concerns as consultations move forward.  Navigator Interventions  To coordinate next available appt with Dr. Francina Ames, [facility], with his scheduler.    [name], RN, BSN Head & Neck Oncology Nurse [place] at [place] [phone]

## 2018-09-10 ENCOUNTER — Ambulatory Visit: Payer: Self-pay | Admitting: Oncology

## 2018-09-10 ENCOUNTER — Telehealth: Payer: Self-pay | Admitting: *Deleted

## 2018-09-10 NOTE — Telephone Encounter (Signed)
Oncology Nurse Navigator Documentation  Received call from Mr. Vanwieren indicating he does not wish to consider surgical tmt option.  He denied wanting to have appt to discuss further.  I indicated I would inform Dr. Isidore Moos and [facility].  [name], RN, BSN Head & Neck Oncology Nurse [place] at [place] [phone]

## 2018-09-13 ENCOUNTER — Other Ambulatory Visit: Payer: Self-pay | Admitting: Hematology

## 2018-09-13 ENCOUNTER — Ambulatory Visit: Payer: Self-pay | Admitting: Hematology

## 2018-09-13 DIAGNOSIS — R7989 Other specified abnormal findings of blood chemistry: Secondary | ICD-10-CM

## 2018-09-13 DIAGNOSIS — F17200 Nicotine dependence, unspecified, uncomplicated: Secondary | ICD-10-CM | POA: Insufficient documentation

## 2018-09-13 DIAGNOSIS — C090 Malignant neoplasm of tonsillar fossa: Secondary | ICD-10-CM

## 2018-09-13 DIAGNOSIS — I10 Essential (primary) hypertension: Secondary | ICD-10-CM | POA: Insufficient documentation

## 2018-09-13 MED ORDER — ONDANSETRON HCL 8 MG PO TABS: 8.0000 mg | ORAL_TABLET | Freq: Two times a day (BID) | ORAL | 1 refills | Status: DC | PRN

## 2018-09-13 MED ORDER — PROCHLORPERAZINE MALEATE 10 MG PO TABS: 10.0000 mg | ORAL_TABLET | Freq: Four times a day (QID) | ORAL | 1 refills | Status: DC | PRN

## 2018-09-13 MED ORDER — DEXAMETHASONE 4 MG PO TABS: ORAL_TABLET | ORAL | 1 refills | Status: DC

## 2018-09-13 MED ORDER — LIDOCAINE-PRILOCAINE 2.5-2.5 % EX CREA: TOPICAL_CREAM | CUTANEOUS | 3 refills | Status: DC

## 2018-09-13 NOTE — Progress Notes (Signed)
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

## 2018-09-13 NOTE — Assessment & Plan Note (Addendum)
I spent some time counseling the patient the importance of tobacco cessation. We discussed common strategies including nicotine patches, Tobacco Quit-line, and other nicotine replacement products to assist in his effort to quit. Patient has reduced his tobacco use from 2 pack/day down to less than half pack per day. I emphasized with the patient about the importance of stopping tobacco use altogether; patient expressed understanding and agreed to the plan.

## 2018-09-13 NOTE — Assessment & Plan Note (Addendum)
-   TSH elevated at 4.6 in August 2019. - No symptoms of clinical hypothyroid at this time. - We will monitor for any symptoms of hypothyroidism; depending on patient's decision regarding surgery versus chemoradiation, he will need thyroid function monitoring closely

## 2018-09-13 NOTE — Telephone Encounter (Signed)
Yes, I'd be happy to order PEG and port, assuming he is a candidate for chemo. I am still learning the speed of scheduling these procedures; I will check with the staff about how quickly we can get these scheduled and let you know if we can start chemo next week.   Thanks.

## 2018-09-13 NOTE — Assessment & Plan Note (Addendum)
-   Currently on lisinopril-hydrochlorothiazide combination. - BP controlled today, encourage patient to adhere to his antihypertensive medication.

## 2018-09-13 NOTE — Assessment & Plan Note (Addendum)
-   I discussed with the patient and family members to rationale for surgery, radiation, and chemotherapy, including the benefits and and toxicities of radiation and chemotherapy (mucositis, dysphagia, acute kidney injury, dehydration, and xerostomia). - I also reviewed with the patient the rationale for feeding tube and port, and provided patient with instruction material on each device - Given the patient's age, upfront surgery, followed by adjuvant radiation or chemoradiation as indicated based on final pathology, would would most likely offer the best chance of long-term disease control. - Patient stated that he initially decided against surgery because of pending Medicaid application and possible delay in being evaluated by ENT at [facility].  I discussed at length with the patient about the process for setting up appointment at the [facility] and will ask our navigator to work closely with [facility] to establish his appointment as soon as possible. - After extensive discussion, patient expressed interest to be evaluated by ENT at [facility], and our navigator will assist in the process. - Patient has evaluation with dental medicine this afternoon, which I encouraged him to keep - We will hold off setting up follow-up appointment with medical oncology until the patient is evaluated by ENT and a decision is made regarding surgery vs. definitive chemoradiation

## 2018-09-14 ENCOUNTER — Telehealth: Payer: Self-pay | Admitting: *Deleted

## 2018-09-14 NOTE — Telephone Encounter (Signed)
Oncology Nurse Navigator Documentation  Spoke with Brian Dixon, reviewed appt schedule for tomorrow including newly scheduled 1:00 appt with Dr. Enrique Sack, [facility], for pre-radiotherapy evaluation/consultation.  He voiced understanding.  [name], RN, BSN Head & Neck Oncology Nurse [place] at [place] [phone]

## 2018-09-15 ENCOUNTER — Telehealth: Payer: Self-pay | Admitting: Hematology

## 2018-09-15 ENCOUNTER — Ambulatory Visit: Payer: No Typology Code available for payment source

## 2018-09-15 ENCOUNTER — Other Ambulatory Visit: Payer: Self-pay | Admitting: *Deleted

## 2018-09-15 ENCOUNTER — Inpatient Hospital Stay: Payer: Medicaid Other | Attending: Hematology | Admitting: Hematology

## 2018-09-15 ENCOUNTER — Ambulatory Visit (HOSPITAL_COMMUNITY): Payer: Self-pay | Admitting: Dentistry

## 2018-09-15 ENCOUNTER — Encounter: Payer: Self-pay | Admitting: Hematology

## 2018-09-15 ENCOUNTER — Telehealth: Payer: Self-pay | Admitting: *Deleted

## 2018-09-15 ENCOUNTER — Ambulatory Visit: Payer: No Typology Code available for payment source | Admitting: Radiation Oncology

## 2018-09-15 ENCOUNTER — Encounter (HOSPITAL_COMMUNITY): Payer: Self-pay | Admitting: Dentistry

## 2018-09-15 VITALS — BP 108/75 | HR 98 | Temp 99.3°F

## 2018-09-15 DIAGNOSIS — I6330 Cerebral infarction due to thrombosis of unspecified cerebral artery: Secondary | ICD-10-CM

## 2018-09-15 DIAGNOSIS — C090 Malignant neoplasm of tonsillar fossa: Secondary | ICD-10-CM

## 2018-09-15 DIAGNOSIS — Z79899 Other long term (current) drug therapy: Secondary | ICD-10-CM | POA: Diagnosis not present

## 2018-09-15 DIAGNOSIS — Z7982 Long term (current) use of aspirin: Secondary | ICD-10-CM

## 2018-09-15 DIAGNOSIS — F17210 Nicotine dependence, cigarettes, uncomplicated: Secondary | ICD-10-CM | POA: Insufficient documentation

## 2018-09-15 DIAGNOSIS — I119 Hypertensive heart disease without heart failure: Secondary | ICD-10-CM

## 2018-09-15 DIAGNOSIS — F17200 Nicotine dependence, unspecified, uncomplicated: Secondary | ICD-10-CM

## 2018-09-15 DIAGNOSIS — I10 Essential (primary) hypertension: Secondary | ICD-10-CM | POA: Diagnosis not present

## 2018-09-15 DIAGNOSIS — K0820 Unspecified atrophy of edentulous alveolar ridge: Secondary | ICD-10-CM

## 2018-09-15 DIAGNOSIS — R7989 Other specified abnormal findings of blood chemistry: Secondary | ICD-10-CM

## 2018-09-15 DIAGNOSIS — K08109 Complete loss of teeth, unspecified cause, unspecified class: Secondary | ICD-10-CM

## 2018-09-15 DIAGNOSIS — M278 Other specified diseases of jaws: Secondary | ICD-10-CM

## 2018-09-15 DIAGNOSIS — Z01818 Encounter for other preprocedural examination: Secondary | ICD-10-CM

## 2018-09-15 MED ORDER — ONDANSETRON HCL 8 MG PO TABS: 8.0000 mg | ORAL_TABLET | Freq: Two times a day (BID) | ORAL | 1 refills | Status: DC | PRN

## 2018-09-15 MED ORDER — DEXAMETHASONE 4 MG PO TABS: ORAL_TABLET | ORAL | 1 refills | Status: DC

## 2018-09-15 MED ORDER — PROCHLORPERAZINE MALEATE 10 MG PO TABS: 10.0000 mg | ORAL_TABLET | Freq: Four times a day (QID) | ORAL | 1 refills | Status: DC | PRN

## 2018-09-15 MED ORDER — LIDOCAINE-PRILOCAINE 2.5-2.5 % EX CREA: TOPICAL_CREAM | CUTANEOUS | 3 refills | Status: DC

## 2018-09-15 NOTE — Progress Notes (Signed)
DENTAL CONSULTATION  Date of Consultation:  09/15/2018 Patient Name:   Brian Dixon Date of Birth:   [birth year] Medical Record Number: [id number]  VITALS: BP 108/75 (BP Location: Right Arm)   Pulse 98   Temp 99.3 F (37.4 C)   CHIEF COMPLAINT: Patient referred by Dr. Maylon Peppers for a dental consultation.  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 Patient being evaluated for a TORS procedure, chemotherapy, and radiation therapy as indicated. Patient is now seen as part of a medically necessary pre-chemoradiation therapy dental protocol examination.  The patient currently denies having any teeth. Patient indicates that most of his teeth"just fell out".  The last tooth fell out approximately December of 2018. Patient did have his wisdom teeth extracted "a long time ago".  Patient denies having any complete dentures. Patient indicates that he "eats anything he wants to".The patient denies having dental phobia.  PROBLEM LIST: Patient Active Problem List   Diagnosis Date Noted  . 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[place]) 08/05/2018    Priority: High  . LVH (left ventricular hypertrophy) due to hypertensive disease, without heart failure 09/15/2018  . Tobacco dependence 09/13/2018  . Elevated TSH 09/13/2018  . Hypertension 09/13/2018  . Cerebral thrombosis with cerebral infarction 08/07/2018  . Syncope 08/05/2018  . Hypokalemia 08/05/2018  . Hypomagnesemia 08/05/2018    PMH: Past Medical History:  Diagnosis Date  . High cholesterol   . Hx of tongue cancer   . Hypertension     PSH: Past Surgical History:  Procedure Laterality Date  . BACK SURGERY  09/19/1997   ruptured disc   . HAND SURGERY Right   . MOUTH SURGERY    . TEE WITHOUT CARDIOVERSION N/A 08/09/2018   Procedure: TRANSESOPHAGEAL ECHOCARDIOGRAM (TEE);  Surgeon: Dorothy Spark, MD;  Location: [facility] ENDOSCOPY;  Service: Cardiovascular;   Laterality: N/A;  Bubble study  . WISDOM TOOTH EXTRACTION      ALLERGIES: No Known Allergies  MEDICATIONS: Current Outpatient Medications  Medication Sig Dispense Refill  . amLODipine (NORVASC) 10 MG tablet Take 1 tablet (10 mg total) by mouth daily. 30 tablet 2  . aspirin 325 MG tablet Take 1 tablet (325 mg total) by mouth daily. 30 tablet 2  . atorvastatin (LIPITOR) 40 MG tablet Take 1 tablet (40 mg total) by mouth daily at 6 PM. 30 tablet 2  . cyanocobalamin 1000 MCG tablet Take 1 tablet (1,000 mcg total) by mouth daily. 30 tablet 2  . guaiFENesin (MUCINEX) 600 MG 12 hr tablet Take 1 tablet (600 mg total) by mouth 2 (two) times daily. 60 tablet 3  . lisinopril-hydrochlorothiazide (PRINZIDE,ZESTORETIC) 20-25 MG tablet Take 1 tablet by mouth daily. 30 tablet 3   No current facility-administered medications for this visit.     LABS: Lab Results  Component Value Date   WBC 8.0 08/24/2018   HGB 14.2 08/24/2018   HCT 42.2 08/24/2018   MCV 102 (H) 08/24/2018   PLT 291 08/24/2018      Component Value Date/Time   NA 137 08/24/2018 1001   K 3.8 08/24/2018 1001   CL 93 (L) 08/24/2018 1001   CO2 28 08/24/2018 1001   GLUCOSE 106 (H) 08/24/2018 1001   GLUCOSE 95 08/09/2018 0514   BUN 14 08/24/2018 1001   CREATININE 1.48 (H) 08/24/2018 1001   CALCIUM 10.4 (H) 08/24/2018 1001   GFRNONAA 54 (L) 08/24/2018 1001   GFRAA 62 08/24/2018 1001   Lab  Results  Component Value Date   INR 0.96 08/05/2018   No results found for: PTT  SOCIAL HISTORY: Social History   Socioeconomic History  . Marital status: Divorced    Spouse name: Not on file  . Number of children: Not on file  . Years of education: Not on file  . Highest education level: Not on file  Occupational History  . Not on file  Social Needs  . Financial resource strain: Not on file  . Food insecurity:    Worry: Not on file    Inability: Not on file  . Transportation needs:    Medical: Not on file    Non-medical: Not  on file  Tobacco Use  . Smoking status: Current Every Day Smoker    Packs/day: 2.00    Years: 35.00    Pack years: 70.00    Types: Cigarettes  . Smokeless tobacco: Former Systems developer  . Tobacco comment: he decreased his smoking to 1/2 pack daily on 8/8/190  Substance and Sexual Activity  . Alcohol use: Yes    Comment: 1/2 gallon liquor weekly, none since 08/05/18  . Drug use: Not Currently  . Sexual activity: Not on file  Lifestyle  . Physical activity:    Days per week: Not on file    Minutes per session: Not on file  . Stress: Not on file  Relationships  . Social connections:    Talks on phone: Not on file    Gets together: Not on file    Attends religious service: Not on file    Active member of club or organization: Not on file    Attends meetings of clubs or organizations: Not on file    Relationship status: Not on file  . Intimate partner violence:    Fear of current or ex partner: No    Emotionally abused: No    Physically abused: No    Forced sexual activity: No  Other Topics Concern  . Not on file  Social History Narrative  . Not on file    FAMILY HISTORY: Family History  Problem Relation Age of Onset  . Hypertension Mother   . Prostate cancer Father     REVIEW OF SYSTEMS: Reviewed with the patient as per History of present illness. Psych: Patient denies having dental phobia.  DENTAL HISTORY: CHIEF COMPLAINT: Patient referred by Dr. Maylon Peppers for a dental consultation.  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 Patient being evaluated for a TORS procedure, chemotherapy, and radiation therapy as indicated. Patient is now seen as part of a medically necessary pre-chemoradiation therapy dental protocol examination.  The patient currently denies having any teeth. Patient indicates that most of his teeth"just fell out".  The last tooth fell out approximately December of 2018. Patient did have  his wisdom teeth extracted "a long time ago".  Patient denies having any complete dentures. Patient indicates that he "eats anything he wants to".The patient denies having dental phobia.   DENTAL EXAMINATION: GENERAL:  Patient is a well-developed, well-nourished male in no acute distress. HEAD AND NECK:  The patient has right neck lymphadenopathy. The patient denies acute TMJ symptoms.  INTRAORAL EXAM: The patient has normal saliva. The patient has a maxillary buccal exostosis in the area of tooth numbers 1 through 3. The maximum interincisal opening is greater than 50 mm. DENTITION:The patient is edentulous. There is atrophy of the edentulous alveolar ridges. PROSTHODONTIC:  Patient denies  having any complete dentures. OCCLUSION:  Patient has a poor occlusal scheme.  RADIOGRAPHIC INTERPRETATION: An orthopantogram was taken today. The patient is edentulous. There is atrophy of the edentulous alveolar ridges. There are no apparent impacted teeth. There are no apparent retained root segments.   ASSESSMENTS: 1. Squamous cell carcinoma of the right tonsil fossa 2. Pre-chemoradiation therapy dental protocol 3. The patient is edentulous 4. There is atrophy of the edentulous alveolar ridges 5. Patient has a maxillary right buccal exostosis in the area of tooth numbers 1 through 3. 6. There are no complete dentures.  PLAN/RECOMMENDATIONS: 1. I discussed the risks, benefits, and complications of various treatment options with the patient in relationship to his medical and dental conditions, anticipated chemoradiation therapy, and chemoradiation therapy side effects to include xerostomia, taste changes, gum and jawbone changes, and risk for infection and osteoradionecrosis. We discussed various treatment options to include no treatment, pre-prosthetic surgery as needed, implant therapy, and fabrication of upper lower complete dentures as indicated. The patient currently does NOT wish to proceed with  pre-prosthetic surgery or future fabrication of upper lower complete dentures. Patient is aware that if he receives extensive radiation therapy to the upper right quadrant, he may not be able to proceed with future pre-prosthetic surgery due to the potential risk for osteoradionecrosis. Patient expresses understanding. The patient is currently cleared for chemoradiation therapy at this time.   2. Discussion of findings with medical team and coordination of future medical and dental care as needed.  I spent in excess of  90 minutes during the conduct of this consultation and >50% of this time involved direct face-to-face encounter for counseling and/or coordination of the patient's care.    Lenn Cal, DDS

## 2018-09-15 NOTE — Telephone Encounter (Signed)
No follow-up visit to be scheduled. Rick (navigator) to coordinate follow-up pending pt's evaluation with surgery. Per 9/18 los

## 2018-09-15 NOTE — Assessment & Plan Note (Signed)
-   Euvolemic on exam today.  No evidence of volume overload. - If patient elects definitive chemoradiation, his volume status will need to be monitored closely for any signs of volume overload from chemotherapy hydration.

## 2018-09-15 NOTE — Patient Instructions (Signed)

## 2018-09-15 NOTE — Assessment & Plan Note (Signed)
-   No residual neurologic deficit at this time - I encourage patient to exercise and follow heart healthy diet to reduce the risk of his cardiovascular disease
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
## 2018-09-15 NOTE — Telephone Encounter (Signed)
Oncology Nurse Navigator Documentation  In follow-up to patient's expressed interest in reinstating referral to [facility] for surgical consult, spoke with Dr. Servando Salina scheduler, Johna Sheriff, requested ASAP appt.  She indicated Charity fundraiser will need to speak with him before appt can be scheduled with OP clinic d/t his Medicaid pending status, after which appt can be scheduled.  I provided her pt's preferred phone # [phone] for contact.  I called pt to inform him of above.  He voiced understanding.  [name], RN, BSN Head & Neck Oncology Nurse [place] at [place] [phone]

## 2018-09-16 ENCOUNTER — Encounter: Payer: Self-pay | Admitting: *Deleted

## 2018-09-23 ENCOUNTER — Telehealth: Payer: Self-pay | Admitting: *Deleted

## 2018-09-23 ENCOUNTER — Encounter: Payer: Self-pay | Admitting: *Deleted

## 2018-09-23 NOTE — Telephone Encounter (Signed)
Oncology Nurse Navigator Documentation  Received email from Johna Sheriff, Scheduling Coordinator [facility] Otolaryngology, indicating Mr. Bluitt has 09/28/18 4:00 pm apt with Dr. Heath Lark.  [name], RN, BSN Head & Neck Oncology Nurse [place] at [place] [phone]

## 2018-09-23 NOTE — Progress Notes (Signed)
A user error has taken place: encounter opened in error, closed for administrative reasons.

## 2018-09-23 NOTE — Progress Notes (Signed)
Oncology Nurse Navigator Documentation  To provide care continuity, met with Brian Dixon during his initial consult with Dr. Zhao.  He was accompanied by his parents. I provided/discussed educational handouts for PEG and PAC, showed example of PAC. He voiced understanding of Dr. Zhao's additional explanation/guidance re surgery vs chemoRT. He expressed interest in scheduling previously refused referral to [facility] for TORS consult. I encouraged him to call me with questions/concerns during care transition.  Navigator Interventions Called Vickie Davis, Scheduling Coordinator [facility], with patient's request.  Rick Diehl, RN, BSN Head & Neck Oncology Nurse Navigator [facility] at [place] [phone]   

## 2018-09-24 ENCOUNTER — Ambulatory Visit (INDEPENDENT_AMBULATORY_CARE_PROVIDER_SITE_OTHER): Payer: Medicaid Other | Admitting: Family Medicine

## 2018-09-24 ENCOUNTER — Encounter: Payer: Self-pay | Admitting: Family Medicine

## 2018-09-24 VITALS — BP 112/64 | HR 80 | Temp 98.3°F | Ht 67.0 in | Wt 179.0 lb

## 2018-09-24 DIAGNOSIS — R059 Cough, unspecified: Secondary | ICD-10-CM

## 2018-09-24 DIAGNOSIS — Z8673 Personal history of transient ischemic attack (TIA), and cerebral infarction without residual deficits: Secondary | ICD-10-CM | POA: Diagnosis not present

## 2018-09-24 DIAGNOSIS — Z716 Tobacco abuse counseling: Secondary | ICD-10-CM | POA: Diagnosis not present

## 2018-09-24 DIAGNOSIS — E785 Hyperlipidemia, unspecified: Secondary | ICD-10-CM

## 2018-09-24 DIAGNOSIS — R05 Cough: Secondary | ICD-10-CM | POA: Diagnosis not present

## 2018-09-24 DIAGNOSIS — Z720 Tobacco use: Secondary | ICD-10-CM

## 2018-09-24 DIAGNOSIS — Z09 Encounter for follow-up examination after completed treatment for conditions other than malignant neoplasm: Secondary | ICD-10-CM | POA: Diagnosis not present

## 2018-09-24 DIAGNOSIS — I10 Essential (primary) hypertension: Secondary | ICD-10-CM

## 2018-09-24 DIAGNOSIS — K649 Unspecified hemorrhoids: Secondary | ICD-10-CM

## 2018-09-24 MED ORDER — LISINOPRIL-HYDROCHLOROTHIAZIDE 20-25 MG PO TABS: 1.0000 | ORAL_TABLET | Freq: Every day | ORAL | 3 refills | Status: DC

## 2018-09-24 MED FILL — ?LISINOPRIL-HCTZ 20/25 TAB: 20-25 | 30 days supply | Qty: 30 | Fill #0
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
## 2018-09-28 DIAGNOSIS — C01 Malignant neoplasm of base of tongue: Secondary | ICD-10-CM | POA: Diagnosis not present

## 2018-10-01 DIAGNOSIS — C01 Malignant neoplasm of base of tongue: Secondary | ICD-10-CM | POA: Diagnosis not present

## 2018-10-07 ENCOUNTER — Telehealth: Payer: Self-pay | Admitting: *Deleted

## 2018-10-07 NOTE — Telephone Encounter (Signed)
Oncology Nurse Navigator Documentation  Pt called to inform surgery with Dr. Vertell Limber, [facility], is scheduled for tomorrow.  I explained will monitor his progress and facilitate his return to [facility] if needed.  He voiced understanding.  Drs. Erasmo Downer and [facility] informed.  [name], RN, BSN Head & Neck Oncology Nurse [place] at [place] [phone]

## 2018-10-08 DIAGNOSIS — Z4659 Encounter for fitting and adjustment of other gastrointestinal appliance and device: Secondary | ICD-10-CM | POA: Diagnosis not present

## 2018-10-08 DIAGNOSIS — C01 Malignant neoplasm of base of tongue: Secondary | ICD-10-CM | POA: Diagnosis not present

## 2018-10-08 DIAGNOSIS — C098 Malignant neoplasm of overlapping sites of tonsil: Secondary | ICD-10-CM | POA: Diagnosis not present

## 2018-10-08 HISTORY — PX: PARTIAL GLOSSECTOMY: SHX2173

## 2018-10-08 HISTORY — PX: SELECTIVE NECK DISSECTION: SHX2391

## 2018-10-08 HISTORY — PX: FREE FLAP RADIAL FOREARM: SHX1678

## 2018-10-08 HISTORY — PX: PHARYNGECTOMY: SUR1024

## 2018-10-08 HISTORY — PX: OTHER SURGICAL HISTORY: SHX169

## 2018-10-09 DIAGNOSIS — Z4659 Encounter for fitting and adjustment of other gastrointestinal appliance and device: Secondary | ICD-10-CM | POA: Diagnosis not present

## 2018-10-10 DIAGNOSIS — I10 Essential (primary) hypertension: Secondary | ICD-10-CM | POA: Diagnosis not present

## 2018-10-10 DIAGNOSIS — N281 Cyst of kidney, acquired: Secondary | ICD-10-CM | POA: Diagnosis not present

## 2018-10-10 DIAGNOSIS — N179 Acute kidney failure, unspecified: Secondary | ICD-10-CM | POA: Diagnosis not present

## 2018-10-10 DIAGNOSIS — C01 Malignant neoplasm of base of tongue: Secondary | ICD-10-CM | POA: Diagnosis not present

## 2018-10-10 DIAGNOSIS — Z930 Tracheostomy status: Secondary | ICD-10-CM | POA: Diagnosis not present

## 2018-10-10 DIAGNOSIS — Z9049 Acquired absence of other specified parts of digestive tract: Secondary | ICD-10-CM | POA: Diagnosis not present

## 2018-10-10 DIAGNOSIS — Z87891 Personal history of nicotine dependence: Secondary | ICD-10-CM | POA: Diagnosis not present

## 2018-10-11 DIAGNOSIS — Z87891 Personal history of nicotine dependence: Secondary | ICD-10-CM | POA: Diagnosis not present

## 2018-10-11 DIAGNOSIS — Z930 Tracheostomy status: Secondary | ICD-10-CM | POA: Diagnosis not present

## 2018-10-11 DIAGNOSIS — Z85810 Personal history of malignant neoplasm of tongue: Secondary | ICD-10-CM | POA: Diagnosis not present

## 2018-10-11 DIAGNOSIS — N179 Acute kidney failure, unspecified: Secondary | ICD-10-CM | POA: Diagnosis not present

## 2018-10-11 DIAGNOSIS — I129 Hypertensive chronic kidney disease with stage 1 through stage 4 chronic kidney disease, or unspecified chronic kidney disease: Secondary | ICD-10-CM | POA: Diagnosis not present

## 2018-10-11 DIAGNOSIS — I10 Essential (primary) hypertension: Secondary | ICD-10-CM | POA: Diagnosis not present

## 2018-10-11 DIAGNOSIS — N189 Chronic kidney disease, unspecified: Secondary | ICD-10-CM | POA: Diagnosis not present

## 2018-10-11 DIAGNOSIS — Z9049 Acquired absence of other specified parts of digestive tract: Secondary | ICD-10-CM | POA: Diagnosis not present

## 2018-10-11 DIAGNOSIS — C109 Malignant neoplasm of oropharynx, unspecified: Secondary | ICD-10-CM | POA: Diagnosis not present

## 2018-10-12 DIAGNOSIS — N179 Acute kidney failure, unspecified: Secondary | ICD-10-CM | POA: Diagnosis not present

## 2018-10-12 DIAGNOSIS — Z930 Tracheostomy status: Secondary | ICD-10-CM | POA: Diagnosis not present

## 2018-10-12 DIAGNOSIS — I10 Essential (primary) hypertension: Secondary | ICD-10-CM | POA: Diagnosis not present

## 2018-10-12 DIAGNOSIS — I129 Hypertensive chronic kidney disease with stage 1 through stage 4 chronic kidney disease, or unspecified chronic kidney disease: Secondary | ICD-10-CM | POA: Diagnosis not present

## 2018-10-12 DIAGNOSIS — N189 Chronic kidney disease, unspecified: Secondary | ICD-10-CM | POA: Diagnosis not present

## 2018-10-12 DIAGNOSIS — C109 Malignant neoplasm of oropharynx, unspecified: Secondary | ICD-10-CM | POA: Diagnosis not present

## 2018-10-12 DIAGNOSIS — Z87891 Personal history of nicotine dependence: Secondary | ICD-10-CM | POA: Diagnosis not present

## 2018-10-12 DIAGNOSIS — Z9049 Acquired absence of other specified parts of digestive tract: Secondary | ICD-10-CM | POA: Diagnosis not present

## 2018-10-12 DIAGNOSIS — Z85810 Personal history of malignant neoplasm of tongue: Secondary | ICD-10-CM | POA: Diagnosis not present

## 2018-10-13 DIAGNOSIS — I129 Hypertensive chronic kidney disease with stage 1 through stage 4 chronic kidney disease, or unspecified chronic kidney disease: Secondary | ICD-10-CM | POA: Diagnosis not present

## 2018-10-13 DIAGNOSIS — R1319 Other dysphagia: Secondary | ICD-10-CM | POA: Diagnosis not present

## 2018-10-13 DIAGNOSIS — N189 Chronic kidney disease, unspecified: Secondary | ICD-10-CM | POA: Diagnosis not present

## 2018-10-13 DIAGNOSIS — Z430 Encounter for attention to tracheostomy: Secondary | ICD-10-CM | POA: Diagnosis not present

## 2018-10-13 DIAGNOSIS — Z87891 Personal history of nicotine dependence: Secondary | ICD-10-CM | POA: Diagnosis not present

## 2018-10-13 DIAGNOSIS — Z85810 Personal history of malignant neoplasm of tongue: Secondary | ICD-10-CM | POA: Diagnosis not present

## 2018-10-13 DIAGNOSIS — Z431 Encounter for attention to gastrostomy: Secondary | ICD-10-CM | POA: Diagnosis not present

## 2018-10-13 DIAGNOSIS — Z9049 Acquired absence of other specified parts of digestive tract: Secondary | ICD-10-CM | POA: Diagnosis not present

## 2018-10-13 DIAGNOSIS — I10 Essential (primary) hypertension: Secondary | ICD-10-CM | POA: Diagnosis not present

## 2018-10-13 DIAGNOSIS — Z930 Tracheostomy status: Secondary | ICD-10-CM | POA: Diagnosis not present

## 2018-10-13 DIAGNOSIS — C109 Malignant neoplasm of oropharynx, unspecified: Secondary | ICD-10-CM | POA: Diagnosis not present

## 2018-10-13 DIAGNOSIS — N179 Acute kidney failure, unspecified: Secondary | ICD-10-CM | POA: Diagnosis not present

## 2018-10-20 MED FILL — raNITIdine HCL 150 MG TABS: 150 | 30 days supply | Qty: 60 | Fill #0

## 2018-10-22 DIAGNOSIS — C01 Malignant neoplasm of base of tongue: Secondary | ICD-10-CM | POA: Diagnosis not present

## 2018-10-22 DIAGNOSIS — R1312 Dysphagia, oropharyngeal phase: Secondary | ICD-10-CM | POA: Diagnosis not present

## 2018-10-22 DIAGNOSIS — K117 Disturbances of salivary secretion: Secondary | ICD-10-CM | POA: Diagnosis not present

## 2018-10-22 DIAGNOSIS — R220 Localized swelling, mass and lump, head: Secondary | ICD-10-CM | POA: Diagnosis not present

## 2018-11-01 ENCOUNTER — Telehealth: Payer: Self-pay | Admitting: *Deleted

## 2018-11-01 NOTE — Telephone Encounter (Signed)
Oncology Nurse Navigator Documentation  Confirmed with pt's mother 11/8 10:30 NE, 11:00 [place], 1:00 CT SIM appts.  She stated he has post-surgical f/u with Dr. Vertell Limber [facility] tomorrow and is still healing.  I explained RT will not start for a couple of weeks pending release from Dr. Vertell Limber.  She voiced understanding.  [name], RN, BSN Head & Neck Oncology Nurse [place] at [place] [phone]

## 2018-11-02 ENCOUNTER — Encounter: Payer: Self-pay | Admitting: Family Medicine

## 2018-11-02 ENCOUNTER — Ambulatory Visit (INDEPENDENT_AMBULATORY_CARE_PROVIDER_SITE_OTHER): Payer: Medicaid Other | Admitting: Family Medicine

## 2018-11-02 VITALS — BP 106/74 | HR 88 | Ht 67.0 in | Wt 168.6 lb

## 2018-11-02 DIAGNOSIS — C090 Malignant neoplasm of tonsillar fossa: Secondary | ICD-10-CM

## 2018-11-02 DIAGNOSIS — I10 Essential (primary) hypertension: Secondary | ICD-10-CM

## 2018-11-02 DIAGNOSIS — Z09 Encounter for follow-up examination after completed treatment for conditions other than malignant neoplasm: Secondary | ICD-10-CM
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
## 2018-11-03 NOTE — Progress Notes (Signed)
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  10/08/18 FINAL PATHOLOGIC DIAGNOSIS MICROSCOPIC EXAMINATION AND DIAGNOSIS A. "SUPERIOR SOFT PALATE", BIOPSY: Negative for carcinoma. B. "HYPOGLOSSAL NERVE", BIOPSY:  Negative for carcinoma. C. "MEDIAL PHARYNX", BIOPSY:  Negative for carcinoma. D. "LATERAL PHARYNX", BIOPSY: Negative for carcinoma. E. "TONGUE BASE", BIOPSY:  Negative for carcinoma. F. "FLOOR OF MOUTH", BIOPSY: Negative for carcinoma. G. "RIGHT LEVEL 1,2,3, 4 AND 5B", LYMPHADENECTOMY: Thirty two benign lymph nodes (0/32). H. "ADDITIONAL RIGHT LEVEL 4", LYMPHADENECTOMY: Four benign lymph nodes (0/4). I. "RIGHT LEVEL 2B", LYMPHADENECTOMY: Four benign lymph nodes (0/4). J. " LEVEL 1A", LYMPHADENECTOMY: One benign lymph node (0/1). K. "LEFT LEVEL 1B", LYMPHADENECTOMY: Four benign lymph nodes (0/4). Benign salivary gland tissue. L. "RIGHT LINGUAL CORTEX", BIOPSY: Benign bone and salivary gland. M. "LEFT 2A& 3,4", LYMPHADENECTOMY: Eleven benign lymph nodes (0/11). N. "LEFT 2 B", LYMPHADENECTOMY: One benign lymph node (0/1). O. "PARTIAL GLOSSECTOMY, PARTIAL PHARYNGECTOMY", RIGHT RADIAL PHARYNGECTOMY AND RIGHT HEMI-GLOSSECTOMY: Invasive keratinizing squamous cell carcinoma, moderately differentiated, involving the right tonsillar, tongue base and glossotonsillar sulcus. Tumor measures at least 2.1 cm in greatest dimension. Tumor invades into the submucosa for a depth of at least 1.5 cm. All resection margins negative for carcinoma. No lymphovascular or perineural invasion identified.     Salivary gland with no significant histopathologic abnormalities.  Patient presented months ago with  symptoms of:  Dr. Redmond Baseman documents 08/12/18:  He noticed a knot in the right side of the neck a couple of weeks ago that has decreased in size. He has noticed his tongue protrudes more to the right.  Biopsies of Right base of tongue revealed: invasive well to moderately differentiated squamous cell carcinoma.   Nutrition Status Yes No Comments  Weight changes? [x]  []  Patient states he has lost 10 pounds  Swallowing concerns? [x]  []  He does not eat anything by mouth.  PEG? [x]  []  10/13/18 at [facility]   Referrals Yes No Comments  Social Work? []  [x]    Dentistry? []  [x]    Swallowing therapy? []  [x]    Nutrition? []  [x]  Feeding tube currently using Peptamen 1.5 5 times per day  Med/Onc? [x]  []     Safety Issues Yes No Comments  Prior radiation? []  [x]    Pacemaker/ICD? []  [x]    Possible current pregnancy? []  [x]    Is the patient on methotrexate? []  [x]     Tobacco/Marijuana/Snuff/ETOH use: He has smoked 2 packs daily for about 35 years. Patient currently has stopped smoking.Marland Kitchen He has a history of heavy alcohol use at times. Patient states that he is no longer drinking alcohol.  Past/Anticipated interventions by otolaryngology, if any:  08/12/18 Dr. Redmond Baseman Procedures:  Preoperative Diagnosis: Tongue base cancer Postoperative Diagnosis: same Procedure: Transnasal fiberoptic laryngoscopy  10/08/18 Dr. Hendricks Limes Procedures  TRACHEOTOMY  SELECTIVE NECK DISSECTION  RIM MANDIBULECTOMY  PHARYNGECTOMY  GLOSSECTOMY PARTIAL  FREE FLAP SCAPULAR  FREE FLAP RADIAL FOREARM   Past/Anticipated interventions by medical oncology, if any:  Dr. Maylon Peppers 09/15/18 ASSESSMENT & PLAN:  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[place]) - I discussed with the patient and family members to rationale for surgery, radiation, and chemotherapy, including the benefits and and toxicities of radiation and chemotherapy (mucositis, dysphagia, acute kidney injury, dehydration, and  xerostomia). - I also reviewed with the patient the rationale for feeding tube and port, and provided patient with instruction material on each device - Given the patient's age, upfront surgery, followed by adjuvant radiation  or chemoradiation as indicated based on final pathology, would would most likely offer the best chance of long-term disease control. - Patient stated that he initially decided against surgery because of pending Medicaid application and possible delay in being evaluated by ENT at [facility].  I discussed at length with the patient about the process for setting up appointment at the [facility] and will ask our navigator to work closely with [facility] to establish his appointment as soon as possible. - After extensive discussion, patient expressed interest to be evaluated by ENT at [facility], and our navigator will assist in the process. - Patient has evaluation with dental medicine this afternoon, which I encouraged him to keep - We will hold off setting up follow-up appointment with medical oncology until the patient is evaluated by ENT and a decision is made regarding surgery vs. definitive chemoradiation   Current Complaints / other details  Patient states that he does not take any thing by mouth. Patient has a  Feeding tube that he gets his nutrient in. He gets Peptamen 1.5 5 times per day. Patient denies any pain. Patient denies any fatigue. Patient states that he has lost 10 pounds. Vitals:   11/05/18 1034 11/05/18 1035  BP: 104/78 104/78  Pulse: 73 73  Resp: 18 18  Temp: 98.2 F (36.8 C) 98.2 F (36.8 C)  TempSrc: Oral Oral  SpO2: 100% 100%  Weight: 169 lb (76.7 kg) 169 lb (76.7 kg)  Height: 5\' 10"  (1.778 m)    Wt Readings from Last 3 Encounters:  11/05/18 169 lb (76.7 kg)  11/02/18 168 lb 9.6 oz (76.5 kg)  09/24/18 179 lb (81.2 kg)

## 2018-11-05 ENCOUNTER — Encounter: Payer: Self-pay | Admitting: *Deleted

## 2018-11-05 ENCOUNTER — Encounter: Payer: Self-pay | Admitting: Radiation Oncology

## 2018-11-05 ENCOUNTER — Telehealth: Payer: Self-pay | Admitting: *Deleted

## 2018-11-05 ENCOUNTER — Other Ambulatory Visit: Payer: Self-pay

## 2018-11-05 ENCOUNTER — Ambulatory Visit: Payer: Self-pay | Admitting: Family Medicine

## 2018-11-05 ENCOUNTER — Ambulatory Visit
Admission: RE | Admit: 2018-11-05 | Discharge: 2018-11-05 | Disposition: A | Discharge status: Home / Self Care | Payer: Medicaid Other | Source: Ambulatory Visit | Attending: Radiation Oncology | Admitting: Radiation Oncology

## 2018-11-05 VITALS — BP 104/78 | HR 73 | Temp 98.2°F | Resp 18 | Ht 70.0 in | Wt 169.0 lb

## 2018-11-05 DIAGNOSIS — C01 Malignant neoplasm of base of tongue: Secondary | ICD-10-CM | POA: Insufficient documentation

## 2018-11-05 DIAGNOSIS — Z9889 Other specified postprocedural states: Secondary | ICD-10-CM | POA: Diagnosis not present

## 2018-11-05 DIAGNOSIS — Z931 Gastrostomy status: Secondary | ICD-10-CM | POA: Insufficient documentation

## 2018-11-05 DIAGNOSIS — Z7982 Long term (current) use of aspirin: Secondary | ICD-10-CM | POA: Insufficient documentation

## 2018-11-05 DIAGNOSIS — C090 Malignant neoplasm of tonsillar fossa: Secondary | ICD-10-CM | POA: Insufficient documentation

## 2018-11-05 DIAGNOSIS — Z79899 Other long term (current) drug therapy: Secondary | ICD-10-CM | POA: Insufficient documentation

## 2018-11-05 DIAGNOSIS — Z930 Tracheostomy status: Secondary | ICD-10-CM | POA: Diagnosis not present

## 2018-11-05 NOTE — Telephone Encounter (Signed)
A user error has taken place: encounter opened in error, closed for administrative reasons.

## 2018-11-05 NOTE — Progress Notes (Signed)
Oncology Nurse Navigator Documentation  To provide support, encouragement and care continuity, met with Brian Dixon during post-surgical reconsult with Dr.  He was accompanied by his parents. It was noted he had PEG placed during surgical hospitalization at [facility]; currently 100% PEG dependent.  He is currently instilling Peptamen 1.5 ca 3 cans daily (vs recommended 4 b/c additional can causes nausea) and Prostat. Mother indicated:  They are working with Lubertha Sayres, [facility], re his Medicaid enrollment.  PEG supplies/supplement being provided/delivered by Sinda Du [phone]).    Had post-surgical follow-up with Dr. Hendricks Limes this Tuesday, seeing Dr. Vertell Limber next week Tuesday. We discussed scheduling of SLP for 11/18, his attendance at 11/26 [facility] [place] to see Nutrition, PT, SW and Financial Counseling. He returned later for [facility], projected [facility] 11/24.  [name], RN, BSN Head & Neck Oncology Nurse [place] at [place] [phone]
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
## 2018-11-05 NOTE — Addendum Note (Signed)
Encounter addended by: Sherrlyn Hock, LPN on: 93/03/683 03:35 AM  Actions taken: Visit diagnoses modified

## 2018-11-05 NOTE — Progress Notes (Signed)
Head and Neck Cancer Simulation, IMRT treatment planning note   Outpatient  Diagnosis:    ICD-10-CM   1. 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HCC) C09.0     The patient was taken to the CT simulator and laid in the supine position on the table. An Aquaplast head and shoulder mask was custom fitted to the patient's anatomy. High-resolution CT axial imaging was obtained of the head and neck without contrast. I verified that the quality of the imaging is good for treatment planning. 1 Medically Necessary Treatment Device was fabricated and supervised by me: Aquaplast mask.  Treatment planning note I plan to treat the patient with IMRT. I plan to treat the patient's tumor bed +/- neck nodes pending discussion with ENT surgeon. I plan to treat to a total dose of 60 Gray in 30  fractions. Dose calculation was ordered from dosimetry.  IMRT planning Note  IMRT is medically necessary and an important modality to deliver adequate dose to the patient's at risk tissues while sparing the patient's normal structures, including the: esophagus, parotid tissue, mandible, brain stem, spinal cord, oral cavity, brachial plexus.  This justifies the use of IMRT in the patient's treatment.    -----------------------------------  Eppie Gibson, MD

## 2018-11-09 DIAGNOSIS — Z87891 Personal history of nicotine dependence: Secondary | ICD-10-CM | POA: Diagnosis not present

## 2018-11-09 DIAGNOSIS — Z483 Aftercare following surgery for neoplasm: Secondary | ICD-10-CM | POA: Diagnosis not present

## 2018-11-09 DIAGNOSIS — C01 Malignant neoplasm of base of tongue: Secondary | ICD-10-CM | POA: Diagnosis not present

## 2018-11-10 ENCOUNTER — Telehealth: Payer: Self-pay | Admitting: *Deleted

## 2018-11-10 NOTE — Telephone Encounter (Signed)
Oncology Nurse Navigator Documentation  Spoke with patient's mother, informed her Nutrition appt scheduled 11/15/18 3:15 following 2:00 SLP appt.   Explained re-scheduled appt with PT pending. She voiced understanding of information provided. Encouraged her to call with questions/concerns.  [name], RN, BSN Head & Neck Oncology Nurse [place] at [place] [phone]   .

## 2018-11-10 NOTE — Telephone Encounter (Signed)
Oncology Nurse Navigator Documentation  Spoke with pt's mother.   Informed:  Start date for RT has been changed to 11/29/18 10:15 to allow for additional post-surgical healing per Dr. Pearlie Oyster conversation with Dr. Hendricks Limes, [facility].    11/15/18 2:00 appt with SLP at [facility]. She noted he has post-surgical follow-up with Dr. Vertell Limber, [facility], the morning of 81/85/63 which conflicts with his [facility] attendance.  I indicated I will coordinate appt with Nutrition the morning he sees SLP and will reschedule PT for another day.  I reviewed registration/arrival procedures for appts, she voiced understanding. Encouraged her to call me with questions/concerns.  [name], RN, BSN Head & Neck Oncology Nurse [place] at [place] [phone]

## 2018-11-15 ENCOUNTER — Inpatient Hospital Stay: Payer: No Typology Code available for payment source | Attending: Hematology | Admitting: Nutrition

## 2018-11-15 ENCOUNTER — Ambulatory Visit: Payer: Medicaid Other | Attending: Family Medicine

## 2018-11-15 ENCOUNTER — Other Ambulatory Visit: Payer: Self-pay

## 2018-11-15 ENCOUNTER — Encounter: Payer: Self-pay | Admitting: *Deleted

## 2018-11-15 DIAGNOSIS — I890 Lymphedema, not elsewhere classified: Secondary | ICD-10-CM | POA: Insufficient documentation

## 2018-11-15 DIAGNOSIS — M436 Torticollis: Secondary | ICD-10-CM | POA: Diagnosis present

## 2018-11-15 DIAGNOSIS — R471 Dysarthria and anarthria: Secondary | ICD-10-CM | POA: Diagnosis present

## 2018-11-15 DIAGNOSIS — C090 Malignant neoplasm of tonsillar fossa: Secondary | ICD-10-CM | POA: Insufficient documentation

## 2018-11-15 DIAGNOSIS — R1310 Dysphagia, unspecified: Secondary | ICD-10-CM | POA: Diagnosis not present

## 2018-11-15 DIAGNOSIS — Z483 Aftercare following surgery for neoplasm: Secondary | ICD-10-CM | POA: Diagnosis present
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
## 2018-11-15 NOTE — Patient Instructions (Signed)
SWALLOWING EXERCISES Do these once a day until radiation starts, then as directed below until 6 weeks after your last radiation session, then 2 times per week afterwards  1. Effortful Swallows - Press your tongue against the roof of your mouth for 3 seconds, then squeeze          the muscles in your neck while you swallow your saliva or a sip of water - Repeat 15 times, 2-3 times a day, and use whenever you eat or drink  2. Masako Swallow - swallow with your tongue sticking out - Stick tongue out past your teeth and gently bite tongue with your teeth - Swallow, while holding your tongue with your teeth - Repeat 15 times, 2-3 times a day *use a wet spoon if your mouth gets dry*  3. Pitch Raise - Repeat "he", once per second in as high of a pitch as you can - Repeat 20 times, 2-3 times a day  4. Shaker Exercise - head lift - Lie flat on your back in your bed or on a couch without pillows - Raise your head and look at your feet - KEEP YOUR SHOULDERS DOWN - HOLD FOR 45-60 SECONDS, then lower your head back down - Repeat 3 times, 2-3 times a day  5. Mendelsohn Maneuver - "half swallow" exercise - Start to swallow, and keep your Adam's apple up by squeezing hard with the            muscles of the throat - Hold the squeeze for 5-7 seconds and then relax - Repeat 10-15 times, 2-3 times a day *use a wet spoon if your mouth gets dry*  6. Tongue Stretch/Teeth Clean - Move your tongue around the pocket between your gums and teeth, clockwise and then counter-clockwise - Repeat on the back side, clockwise and then counter-clockwise - Repeat 15 times, 2-3 times a day  7. Breath Hold - Say "HUH!" loudly, then hold your breath for 3 seconds at your voice box - Repeat 20 times, 2-3 times a day  8. Chin pushback - Place your thumbs UNDER your chin near your neck, push chin down and back with your thumbs for 5 seconds - Repeat 10 times, 2-3 times a day

## 2018-11-16 DIAGNOSIS — C090 Malignant neoplasm of tonsillar fossa: Secondary | ICD-10-CM | POA: Diagnosis not present

## 2018-11-16 DIAGNOSIS — Z483 Aftercare following surgery for neoplasm: Secondary | ICD-10-CM | POA: Diagnosis not present

## 2018-11-16 DIAGNOSIS — C01 Malignant neoplasm of base of tongue: Secondary | ICD-10-CM | POA: Diagnosis not present

## 2018-11-16 DIAGNOSIS — Z87891 Personal history of nicotine dependence: Secondary | ICD-10-CM | POA: Diagnosis not present

## 2018-11-17 ENCOUNTER — Encounter: Payer: Self-pay | Admitting: Family Medicine

## 2018-11-17 ENCOUNTER — Ambulatory Visit (INDEPENDENT_AMBULATORY_CARE_PROVIDER_SITE_OTHER): Payer: Medicaid Other | Admitting: Family Medicine

## 2018-11-17 VITALS — BP 128/88 | HR 88 | Temp 98.8°F | Ht 70.0 in | Wt 165.0 lb

## 2018-11-17 DIAGNOSIS — I10 Essential (primary) hypertension: Secondary | ICD-10-CM

## 2018-11-17 DIAGNOSIS — Z8673 Personal history of transient ischemic attack (TIA), and cerebral infarction without residual deficits: Secondary | ICD-10-CM | POA: Diagnosis not present

## 2018-11-17 DIAGNOSIS — Z09 Encounter for follow-up examination after completed treatment for conditions other than malignant neoplasm: Secondary | ICD-10-CM

## 2018-11-17 DIAGNOSIS — R070 Pain in throat: Secondary | ICD-10-CM

## 2018-11-17 DIAGNOSIS — C090 Malignant neoplasm of tonsillar fossa: Secondary | ICD-10-CM | POA: Diagnosis not present

## 2018-11-17 MED FILL — LIDOCAINE 2% VISCOUS SOLN: 2 | 5 days supply | Qty: 100 | Fill #0
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
## 2018-11-19 ENCOUNTER — Ambulatory Visit: Payer: Medicaid Other | Admitting: Rehabilitation

## 2018-11-19 ENCOUNTER — Other Ambulatory Visit: Payer: Self-pay

## 2018-11-19 ENCOUNTER — Encounter: Payer: Self-pay | Admitting: Rehabilitation

## 2018-11-19 DIAGNOSIS — Z483 Aftercare following surgery for neoplasm: Secondary | ICD-10-CM

## 2018-11-19 DIAGNOSIS — C090 Malignant neoplasm of tonsillar fossa: Secondary | ICD-10-CM

## 2018-11-19 DIAGNOSIS — M436 Torticollis: Secondary | ICD-10-CM

## 2018-11-19 DIAGNOSIS — R1310 Dysphagia, unspecified: Secondary | ICD-10-CM | POA: Diagnosis not present

## 2018-11-19 DIAGNOSIS — I890 Lymphedema, not elsewhere classified: Secondary | ICD-10-CM

## 2018-11-19 NOTE — Patient Instructions (Signed)
Head and neck stretching handout, wall walking for bilateral shoulder stiffness, when to return, and use of chip pack
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
## 2018-11-19 NOTE — Progress Notes (Signed)
Oncology Nurse Navigator Documentation  Met with pt and his parents during Nutrition appt with Dory Peru.  Assisted with change out of button PEG tube with wider bore tube brought by patient. Provided verbal/written PEG care education.  [name], RN, BSN Head & Neck Oncology Nurse [place] at [place] [phone]

## 2018-11-21 ENCOUNTER — Ambulatory Visit: Payer: Medicaid Other | Admitting: Radiation Oncology

## 2018-11-22 ENCOUNTER — Ambulatory Visit: Payer: Medicaid Other

## 2018-11-22 ENCOUNTER — Telehealth: Payer: Self-pay | Admitting: *Deleted

## 2018-11-22 NOTE — Telephone Encounter (Signed)
Oncology Nurse Navigator Documentation  In follow-up to my exchange with SLP Garald Balding re patient's clearance for POs, called patient's mother. She indicated they have surgical follow-up appt with Dr. Vertell Limber [facility] tomorrow morning.   I explained if Dr. Vertell Limber clears her son for oral intake, we will schedule a MBSS at [facility] to provide SLP Garald Balding information regarding his swallowing function and future SLP guidance.   I asked her to share this information during the appt, indicated I will also email Dr. Vertell Limber with same. She voiced understanding, indicated she will call me tomorrow s/p appt.  [name], RN, BSN Head & Neck Oncology Nurse [place] at [place] [phone]

## 2018-11-23 ENCOUNTER — Telehealth: Payer: Self-pay | Admitting: *Deleted

## 2018-11-23 ENCOUNTER — Encounter: Payer: Self-pay | Admitting: Nutrition

## 2018-11-23 ENCOUNTER — Ambulatory Visit: Payer: Medicaid Other

## 2018-11-23 ENCOUNTER — Ambulatory Visit: Payer: Medicaid Other | Admitting: Physical Therapy

## 2018-11-23 DIAGNOSIS — C109 Malignant neoplasm of oropharynx, unspecified: Secondary | ICD-10-CM | POA: Diagnosis not present

## 2018-11-23 DIAGNOSIS — Z9889 Other specified postprocedural states: Secondary | ICD-10-CM | POA: Diagnosis not present

## 2018-11-24 ENCOUNTER — Ambulatory Visit: Payer: Medicaid Other

## 2018-11-24 NOTE — Telephone Encounter (Signed)
Oncology Nurse Navigator Documentation  Received VMM from patient's mother.  She indicated they were seen by Dr. Vertell Limber, [facility], this morning.  [place] was cleared for POs and MBSS.  Dr. Isidore Moos and [place] notified.  [name], RN, BSN Head & Neck Oncology Nurse [place] at [place] [phone]

## 2018-11-29 ENCOUNTER — Encounter: Payer: Self-pay | Admitting: *Deleted

## 2018-11-29 ENCOUNTER — Telehealth (HOSPITAL_COMMUNITY): Payer: Self-pay

## 2018-11-29 ENCOUNTER — Other Ambulatory Visit: Payer: Self-pay | Admitting: Radiation Oncology

## 2018-11-29 ENCOUNTER — Telehealth: Payer: Self-pay | Admitting: *Deleted

## 2018-11-29 ENCOUNTER — Other Ambulatory Visit (HOSPITAL_COMMUNITY): Payer: Self-pay | Admitting: Radiation Oncology

## 2018-11-29 ENCOUNTER — Telehealth: Payer: Self-pay | Admitting: Nutrition

## 2018-11-29 ENCOUNTER — Ambulatory Visit
Admission: RE | Admit: 2018-11-29 | Discharge: 2018-11-29 | Disposition: A | Discharge status: Home / Self Care | Payer: Medicaid Other | Source: Ambulatory Visit | Attending: Radiation Oncology | Admitting: Radiation Oncology

## 2018-11-29 DIAGNOSIS — R1310 Dysphagia, unspecified: Secondary | ICD-10-CM

## 2018-11-29 DIAGNOSIS — C090 Malignant neoplasm of tonsillar fossa: Secondary | ICD-10-CM | POA: Diagnosis not present

## 2018-11-29 DIAGNOSIS — C01 Malignant neoplasm of base of tongue: Secondary | ICD-10-CM | POA: Diagnosis not present

## 2018-11-29 MED ORDER — RADIAPLEXRX EX GEL
Freq: Once | CUTANEOUS | Status: AC
  Administered 2018-11-29: 12:00:00 via TOPICAL

## 2018-11-29 NOTE — Telephone Encounter (Signed)
Attempted to contact patient to schedule MBS - lm on vm °

## 2018-11-29 NOTE — Telephone Encounter (Signed)
Called acute rehab to arrange MBSS for this patient, spoke with scheduler and she will call him and get him scheduled

## 2018-11-29 NOTE — Progress Notes (Signed)
Pt here for patient teaching.  Pt given Radiation and You booklet, Managing Acute Radiation Side Effects for Head and Neck Cancer handout, skin care instructions and Radiaplex gel.  Reviewed areas of pertinence such as fatigue, hair loss, mouth changes, skin changes, throat changes and taste changes . Pt able to give teach back of to pat skin, use unscented/gentle soap and drink plenty of water,apply Radiaplex bid, avoid applying anything to skin within 4 hours of treatment and to use an electric razor if they must shave. Pt verbalizes understanding of information given and will contact nursing with any questions or concerns.     Http://[email or web address]/treatmentinformation/whattoexpect/index

## 2018-11-29 NOTE — Telephone Encounter (Signed)
Contacted patient at his home and spoke with his mother.  She reports patient has been tolerating Peptamen 1.5, approximately 4 bottles daily in 4 feedings. He is eating some food.  Patient reports weight was 174 today when he was weighed and radiation therapy.  Weight has increased from 165 pounds.  They would like to try samples of Osmolite 1.5 to see if he tolerates this formula.  I would expect that he should tolerate it since he is tolerating food.  I will provide him with a sample case of product and determine tolerance.  I will meet with him on Friday during his infusion.  If tolerated will change order to Osmolite 1.5 so patient can better afford his tube feeding.  Patient's mother expressed appreciation.

## 2018-11-30 ENCOUNTER — Ambulatory Visit
Admission: RE | Admit: 2018-11-30 | Discharge: 2018-11-30 | Disposition: A | Discharge status: Home / Self Care | Payer: Medicaid Other | Source: Ambulatory Visit | Attending: Radiation Oncology | Admitting: Radiation Oncology

## 2018-11-30 ENCOUNTER — Other Ambulatory Visit: Payer: Self-pay | Admitting: Radiation Oncology

## 2018-11-30 DIAGNOSIS — C01 Malignant neoplasm of base of tongue: Secondary | ICD-10-CM

## 2018-11-30 DIAGNOSIS — C090 Malignant neoplasm of tonsillar fossa: Secondary | ICD-10-CM | POA: Diagnosis not present

## 2018-11-30 MED ORDER — DEXAMETHASONE 2 MG PO TABS: ORAL_TABLET | ORAL | 0 refills | Status: DC

## 2018-11-30 MED FILL — ATORVASTATIN CALCIUM 40 MG: 40 | 30 days supply | Qty: 30 | Fill #1

## 2018-12-01 ENCOUNTER — Ambulatory Visit
Admission: RE | Admit: 2018-12-01 | Discharge: 2018-12-01 | Disposition: A | Discharge status: Home / Self Care | Payer: Medicaid Other | Source: Ambulatory Visit | Attending: Radiation Oncology | Admitting: Radiation Oncology

## 2018-12-01 DIAGNOSIS — C090 Malignant neoplasm of tonsillar fossa: Secondary | ICD-10-CM | POA: Diagnosis not present

## 2018-12-01 DIAGNOSIS — C01 Malignant neoplasm of base of tongue: Secondary | ICD-10-CM | POA: Diagnosis not present

## 2018-12-02 ENCOUNTER — Ambulatory Visit
Admission: RE | Admit: 2018-12-02 | Discharge: 2018-12-02 | Disposition: A | Discharge status: Home / Self Care | Payer: Medicaid Other | Source: Ambulatory Visit | Attending: Radiation Oncology | Admitting: Radiation Oncology

## 2018-12-02 DIAGNOSIS — C090 Malignant neoplasm of tonsillar fossa: Secondary | ICD-10-CM | POA: Diagnosis not present

## 2018-12-02 DIAGNOSIS — C01 Malignant neoplasm of base of tongue: Secondary | ICD-10-CM | POA: Diagnosis not present

## 2018-12-02 NOTE — Progress Notes (Signed)
Oncology Nurse Navigator Documentation  To provide support, encouragement and care continuity, met with Brian Dixon for his initial  RT.  He was accompanied by his parents who waited in Radiation Waiting.  I reviewed the 2-step treatment process, answered questions.   He completed treatment without difficulty, denied questions/concerns.  I reviewed the registration/arrival procedure for subsequent treatments, escorted him to Nursing for weekly PUT with Dr. Isidore Moos..  I encouraged him to call me with questions/concerns as tmts proceed.  [name], RN, BSN Head & Neck Oncology Nurse [place] at [place] [phone]

## 2018-12-03 ENCOUNTER — Ambulatory Visit: Payer: Self-pay | Admitting: Nutrition

## 2018-12-03 ENCOUNTER — Ambulatory Visit
Admission: RE | Admit: 2018-12-03 | Discharge: 2018-12-03 | Disposition: A | Discharge status: Home / Self Care | Payer: Medicaid Other | Source: Ambulatory Visit | Attending: Radiation Oncology | Admitting: Radiation Oncology

## 2018-12-03 ENCOUNTER — Other Ambulatory Visit: Payer: Self-pay | Admitting: Nutrition

## 2018-12-03 VITALS — Wt 175.8 lb

## 2018-12-03 DIAGNOSIS — C090 Malignant neoplasm of tonsillar fossa: Secondary | ICD-10-CM | POA: Diagnosis not present

## 2018-12-03 DIAGNOSIS — C01 Malignant neoplasm of base of tongue: Secondary | ICD-10-CM | POA: Diagnosis not present

## 2018-12-03 MED ORDER — OSMOLITE 1.5 CAL PO LIQD: ORAL | 0 refills | Status: DC

## 2018-12-03 NOTE — Progress Notes (Signed)
Nutrition follow-up completed with patient status post radiation therapy for base of tongue cancer. Weight improved and documented as 175.8 pounds on December 6.  This is improved from 165.6 pounds. Patient reports early morning nausea but no vomiting.  Reports taking anti-emetic. States he has had some heartburn but thinks it is from prednisone. He is beginning to get very thick saliva. He denies bowel issues. Patient reports he has been caring for his feeding tube without difficulty. He has been using 3 bottles of tube feeding via PEG daily. He is tolerating Osmolite 1.5 without difficulty and wishes to change from Peptamen 1.5. Patient is wondering how to use whey protein for additional calories and protein.  Estimated nutrition needs: 2200-2400 cal, 100-120 g protein.,  2.2 L fluid.  Nutrition diagnosis: Inadequate enteral nutrition resolved. New nutrition diagnosis of inadequate oral intake related to tongue cancer as evidenced by need for enteral nutrition.  Intervention: Will change Peptamen 1.5 to Osmolite 1.5 with goal rate of 6 bottles daily. Continue 60 mL free water flush before and after bolus feedings. In addition, use 240 mL free water 3 times a day between feedings. Educated patient to consume 1 scoop of protein powder with 8 ounces liquid as tolerated. 6 bottles Osmolite 1.5 provides 2130 cal, 89.4 g protein, 1086 mL free water without protein supplement.  Expect 1 scoop of protein powder adds approximately 100 cal and 20 g of protein to provide 100% estimated nutrition needs. Will contact [place] home services to change tube feeding orders. Teach back method used.  Monitoring, evaluation, goals: Patient will tolerate tube feeding plus protein supplement and oral intake to meet greater than 90% estimated nutrition needs while minimum minimizing weight loss.  Next visit: Wednesday, December 11.  **Disclaimer: This note was dictated with voice recognition software. Similar  sounding words can inadvertently be transcribed and this note may contain transcription errors which may not have been corrected upon publication of note.**

## 2018-12-06 ENCOUNTER — Ambulatory Visit
Admission: RE | Admit: 2018-12-06 | Discharge: 2018-12-06 | Disposition: A | Discharge status: Home / Self Care | Payer: Medicaid Other | Source: Ambulatory Visit | Attending: Radiation Oncology | Admitting: Radiation Oncology

## 2018-12-06 DIAGNOSIS — C090 Malignant neoplasm of tonsillar fossa: Secondary | ICD-10-CM | POA: Diagnosis not present

## 2018-12-06 DIAGNOSIS — C01 Malignant neoplasm of base of tongue: Secondary | ICD-10-CM | POA: Diagnosis not present

## 2018-12-07 ENCOUNTER — Ambulatory Visit
Admission: RE | Admit: 2018-12-07 | Discharge: 2018-12-07 | Disposition: A | Discharge status: Home / Self Care | Payer: Medicaid Other | Source: Ambulatory Visit | Attending: Radiation Oncology | Admitting: Radiation Oncology

## 2018-12-07 DIAGNOSIS — C090 Malignant neoplasm of tonsillar fossa: Secondary | ICD-10-CM | POA: Diagnosis not present

## 2018-12-07 DIAGNOSIS — C01 Malignant neoplasm of base of tongue: Secondary | ICD-10-CM | POA: Diagnosis not present

## 2018-12-08 ENCOUNTER — Ambulatory Visit
Admission: RE | Admit: 2018-12-08 | Discharge: 2018-12-08 | Disposition: A | Discharge status: Home / Self Care | Payer: Medicaid Other | Source: Ambulatory Visit | Attending: Radiation Oncology | Admitting: Radiation Oncology

## 2018-12-08 ENCOUNTER — Ambulatory Visit: Payer: Self-pay | Admitting: Nutrition

## 2018-12-08 DIAGNOSIS — C01 Malignant neoplasm of base of tongue: Secondary | ICD-10-CM | POA: Diagnosis not present

## 2018-12-08 DIAGNOSIS — C090 Malignant neoplasm of tonsillar fossa: Secondary | ICD-10-CM | POA: Diagnosis not present

## 2018-12-08 NOTE — Progress Notes (Signed)
Nutrition follow-up completed with patient and his parents after radiation therapy for base of tongue cancer. Patient states he is continuing to eat well. He has reduced tube feeding to at least 2 bottles of Osmolite 1.5 daily. He continues to have occasional nausea and heartburn.  He takes Tums with good relief. Weight today improved and documented as 185 pounds up from 175.8 pounds December 6.  Estimated nutrition needs: 2200-2400 cal, 100-120 g protein, 2.2 L fluid.  Nutrition diagnosis: Inadequate oral intake continues.  Intervention: Patient will continue Osmolite 1.5 via feeding tube between 2-4 bottles daily as needed based on oral intake and weight fluctuation. He will continue free water flushes. Questions were answered.  Teach back method used.  Monitoring, evaluation, goals: Patient will tolerate oral intake plus tube feeding to minimize weight loss throughout treatment.  Next visit: Tuesday, December 17.  **Disclaimer: This note was dictated with voice recognition software. Similar sounding words can inadvertently be transcribed and this note may contain transcription errors which may not have been corrected upon publication of note.**

## 2018-12-09 ENCOUNTER — Ambulatory Visit
Admission: RE | Admit: 2018-12-09 | Discharge: 2018-12-09 | Disposition: A | Discharge status: Home / Self Care | Payer: Medicaid Other | Source: Ambulatory Visit | Attending: Radiation Oncology | Admitting: Radiation Oncology

## 2018-12-09 DIAGNOSIS — C090 Malignant neoplasm of tonsillar fossa: Secondary | ICD-10-CM | POA: Diagnosis not present

## 2018-12-09 DIAGNOSIS — C01 Malignant neoplasm of base of tongue: Secondary | ICD-10-CM | POA: Diagnosis not present

## 2018-12-09 MED FILL — LIDOCAINE 2% VISCOUS SOLN: 2 | 5 days supply | Qty: 100 | Fill #1

## 2018-12-10 ENCOUNTER — Ambulatory Visit (HOSPITAL_COMMUNITY)
Admission: RE | Admit: 2018-12-10 | Discharge: 2018-12-10 | Disposition: A | Discharge status: Home / Self Care | Payer: Medicaid Other | Source: Ambulatory Visit | Attending: Radiation Oncology | Admitting: Radiation Oncology

## 2018-12-10 ENCOUNTER — Ambulatory Visit
Admission: RE | Admit: 2018-12-10 | Discharge: 2018-12-10 | Disposition: A | Discharge status: Home / Self Care | Payer: Medicaid Other | Source: Ambulatory Visit | Attending: Radiation Oncology | Admitting: Radiation Oncology

## 2018-12-10 DIAGNOSIS — C01 Malignant neoplasm of base of tongue: Secondary | ICD-10-CM | POA: Insufficient documentation

## 2018-12-10 DIAGNOSIS — C090 Malignant neoplasm of tonsillar fossa: Secondary | ICD-10-CM | POA: Diagnosis not present

## 2018-12-10 DIAGNOSIS — R1310 Dysphagia, unspecified: Secondary | ICD-10-CM | POA: Diagnosis present

## 2018-12-13 ENCOUNTER — Ambulatory Visit: Payer: Medicaid Other | Attending: Radiation Oncology

## 2018-12-13 ENCOUNTER — Ambulatory Visit
Admission: RE | Admit: 2018-12-13 | Discharge: 2018-12-13 | Disposition: A | Discharge status: Home / Self Care | Payer: Medicaid Other | Source: Ambulatory Visit | Attending: Radiation Oncology | Admitting: Radiation Oncology

## 2018-12-13 ENCOUNTER — Other Ambulatory Visit: Payer: Self-pay | Admitting: Radiation Oncology

## 2018-12-13 DIAGNOSIS — C090 Malignant neoplasm of tonsillar fossa: Secondary | ICD-10-CM | POA: Diagnosis not present

## 2018-12-13 DIAGNOSIS — C01 Malignant neoplasm of base of tongue: Secondary | ICD-10-CM

## 2018-12-13 DIAGNOSIS — R471 Dysarthria and anarthria: Secondary | ICD-10-CM | POA: Diagnosis present

## 2018-12-13 DIAGNOSIS — R1310 Dysphagia, unspecified: Secondary | ICD-10-CM | POA: Diagnosis present

## 2018-12-13 MED ORDER — LIDOCAINE VISCOUS HCL 2 % MT SOLN: OROMUCOSAL | 4 refills | Status: DC

## 2018-12-13 MED ORDER — GABAPENTIN 300 MG PO CAPS: 300.0000 mg | ORAL_CAPSULE | Freq: Three times a day (TID) | ORAL | 0 refills | Status: DC

## 2018-12-13 MED FILL — GABAPENTIN 300 MG CAPSULE: 300 | 30 days supply | Qty: 90 | Fill #0

## 2018-12-13 NOTE — Patient Instructions (Signed)
Soft diet - emphasize "cohesive" foods like scrambled eggs, macaroni/cheese, and the like  When you eat: Place foods on the left side, instead of in the back of your mouth.  Start meals with 2-3 sips of liquid  Alternate between food and drink in 1:1 ratio (food-drink-food-drink-food-drink)   Use lidocaine if necessary prior to eating  "hock" or cough periodically throughout the meal to keep these muscles strong and to stop any food or liquid from moving into your windpipe (every 4-6 bites/sips)   Take your medications with applesauce or pureed foods, if it gets harder to do take them with water in the future
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
## 2018-12-14 ENCOUNTER — Ambulatory Visit: Payer: Self-pay | Admitting: Nutrition

## 2018-12-14 ENCOUNTER — Ambulatory Visit: Admission: RE | Admit: 2018-12-14 | Payer: Medicaid Other | Source: Ambulatory Visit

## 2018-12-14 NOTE — Progress Notes (Signed)
Nutrition follow-up completed with patient and his father. Patient was unable to receive radiation therapy today for base of tongue cancer. He reports that he is unable to eat much now.  Reports throat is very sore and lidocaine does not help. He has started gabapentin however he claims this does not help him either. Patient has been using 3 bottles Osmolite 1.5 daily via PEG. Weight decreased and documented as 178.2 pounds today down from 185 pounds last week.  Patient continues to wake up at 4 AM every morning with vomiting. Patient denies vomiting and nausea throughout the day. He denies diarrhea or constipation.  Nutrition diagnosis: Inadequate oral intake continues.  Estimated nutrition needs: 2200-2400 cal, 100-120 g protein, 2.2 L fluid.  Intervention: Patient educated to increase Osmolite 1.5 via PEG to 6 bottles daily with 60 mL free water flushes before and after bolus feedings. Enforced importance of adequate water intake of about 24 ounces daily. He will continue oral intake as tolerated. Patient reports he has adequate tube feeding supplies and his tube site looks good. Questions were answered.  Teach back method used. 6 bottles Osmolite 1.5 provides 2130 cal, 89.4 g protein, 1086 mL free water.  Monitoring, evaluation, goals: Patient will tolerate tube feeding at goal rate to minimize further weight loss.  Next visit: Tuesday, December 24.  **Disclaimer: This note was dictated with voice recognition software. Similar sounding words can inadvertently be transcribed and this note may contain transcription errors which may not have been corrected upon publication of note.**

## 2018-12-15 ENCOUNTER — Ambulatory Visit
Admission: RE | Admit: 2018-12-15 | Discharge: 2018-12-15 | Disposition: A | Discharge status: Home / Self Care | Payer: Medicaid Other | Source: Ambulatory Visit | Attending: Radiation Oncology | Admitting: Radiation Oncology

## 2018-12-15 DIAGNOSIS — C090 Malignant neoplasm of tonsillar fossa: Secondary | ICD-10-CM | POA: Diagnosis not present

## 2018-12-15 DIAGNOSIS — C01 Malignant neoplasm of base of tongue: Secondary | ICD-10-CM | POA: Diagnosis not present

## 2018-12-16 ENCOUNTER — Ambulatory Visit
Admission: RE | Admit: 2018-12-16 | Discharge: 2018-12-16 | Disposition: A | Discharge status: Home / Self Care | Payer: Medicaid Other | Source: Ambulatory Visit | Attending: Radiation Oncology | Admitting: Radiation Oncology

## 2018-12-16 DIAGNOSIS — C01 Malignant neoplasm of base of tongue: Secondary | ICD-10-CM | POA: Diagnosis not present

## 2018-12-16 DIAGNOSIS — C090 Malignant neoplasm of tonsillar fossa: Secondary | ICD-10-CM | POA: Diagnosis not present

## 2018-12-17 ENCOUNTER — Ambulatory Visit
Admission: RE | Admit: 2018-12-17 | Discharge: 2018-12-17 | Disposition: A | Discharge status: Home / Self Care | Payer: Medicaid Other | Source: Ambulatory Visit | Attending: Radiation Oncology | Admitting: Radiation Oncology

## 2018-12-17 DIAGNOSIS — C01 Malignant neoplasm of base of tongue: Secondary | ICD-10-CM | POA: Diagnosis not present

## 2018-12-17 DIAGNOSIS — C090 Malignant neoplasm of tonsillar fossa: Secondary | ICD-10-CM | POA: Diagnosis not present

## 2018-12-20 ENCOUNTER — Other Ambulatory Visit: Payer: Self-pay | Admitting: Radiation Oncology

## 2018-12-20 ENCOUNTER — Ambulatory Visit
Admission: RE | Admit: 2018-12-20 | Discharge: 2018-12-20 | Disposition: A | Discharge status: Home / Self Care | Payer: Medicaid Other | Source: Ambulatory Visit | Attending: Radiation Oncology | Admitting: Radiation Oncology

## 2018-12-20 DIAGNOSIS — C090 Malignant neoplasm of tonsillar fossa: Secondary | ICD-10-CM

## 2018-12-20 DIAGNOSIS — C01 Malignant neoplasm of base of tongue: Secondary | ICD-10-CM

## 2018-12-20 MED ORDER — RADIAPLEXRX EX GEL
Freq: Once | CUTANEOUS | Status: AC
  Administered 2018-12-20: 11:00:00 via TOPICAL

## 2018-12-20 MED ORDER — GABAPENTIN 300 MG PO CAPS: 600.0000 mg | ORAL_CAPSULE | Freq: Three times a day (TID) | ORAL | 0 refills | Status: DC

## 2018-12-21 ENCOUNTER — Ambulatory Visit
Admission: RE | Admit: 2018-12-21 | Discharge: 2018-12-21 | Disposition: A | Discharge status: Home / Self Care | Payer: Medicaid Other | Source: Ambulatory Visit | Attending: Radiation Oncology | Admitting: Radiation Oncology

## 2018-12-21 ENCOUNTER — Ambulatory Visit: Payer: Self-pay | Admitting: Nutrition

## 2018-12-21 DIAGNOSIS — C01 Malignant neoplasm of base of tongue: Secondary | ICD-10-CM | POA: Diagnosis not present

## 2018-12-21 DIAGNOSIS — C090 Malignant neoplasm of tonsillar fossa: Secondary | ICD-10-CM | POA: Diagnosis not present

## 2018-12-21 NOTE — Progress Notes (Signed)
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 Patient continues to report very sore mouth and throat. Patient reports he has increased Osmolite 1.5-4 bottles daily. He is still trying to eat by mouth. His weight is improved and documented as 180.4 pounds today up from 178.2 pounds last week. Patient denies vomiting, nausea, constipation, diarrhea.  Nutrition diagnosis: Inadequate oral intake continues.  Estimated nutrition needs: 2200-2400 cal, 100-120 g protein, 2.2 L fluid.  Intervention: Reminded patient his goal rate of Osmolite 1.5-6 bottles daily with 60 mL free water before and after bolus feedings if he cannot consume oral intake. Encourage patient to continue adequate hydration with a minimum of 24 ounces daily. Provided patient with 2 samples of Soothe oral nutrition supplements for him to try.  Provided ordering information. Questions were answered.  Teach back method used.  6 bottles Osmolite 1.5 provides 2130 cal, 89.4 g protein, 1086 mL free water.  Monitoring, evaluation, goals: Patient will tolerate tube feeding and oral intake to minimize further weight loss.  Next visit: Tuesday, December 31.  **Disclaimer: This note was dictated with voice recognition software. Similar sounding words can inadvertently be transcribed and this note may contain transcription errors which may not have been corrected upon publication of note.**

## 2018-12-23 ENCOUNTER — Ambulatory Visit
Admission: RE | Admit: 2018-12-23 | Discharge: 2018-12-23 | Disposition: A | Discharge status: Home / Self Care | Payer: Medicaid Other | Source: Ambulatory Visit | Attending: Radiation Oncology | Admitting: Radiation Oncology

## 2018-12-23 DIAGNOSIS — C01 Malignant neoplasm of base of tongue: Secondary | ICD-10-CM | POA: Diagnosis not present

## 2018-12-23 DIAGNOSIS — C090 Malignant neoplasm of tonsillar fossa: Secondary | ICD-10-CM | POA: Diagnosis not present

## 2018-12-24 ENCOUNTER — Ambulatory Visit
Admission: RE | Admit: 2018-12-24 | Discharge: 2018-12-24 | Disposition: A | Discharge status: Home / Self Care | Payer: Medicaid Other | Source: Ambulatory Visit | Attending: Radiation Oncology | Admitting: Radiation Oncology

## 2018-12-24 DIAGNOSIS — C090 Malignant neoplasm of tonsillar fossa: Secondary | ICD-10-CM | POA: Diagnosis not present

## 2018-12-24 DIAGNOSIS — C01 Malignant neoplasm of base of tongue: Secondary | ICD-10-CM | POA: Diagnosis not present

## 2018-12-27 ENCOUNTER — Ambulatory Visit
Admission: RE | Admit: 2018-12-27 | Discharge: 2018-12-27 | Disposition: A | Discharge status: Home / Self Care | Payer: Medicaid Other | Source: Ambulatory Visit | Attending: Radiation Oncology | Admitting: Radiation Oncology

## 2018-12-27 DIAGNOSIS — C090 Malignant neoplasm of tonsillar fossa: Secondary | ICD-10-CM | POA: Diagnosis not present

## 2018-12-27 DIAGNOSIS — C01 Malignant neoplasm of base of tongue: Secondary | ICD-10-CM | POA: Diagnosis not present

## 2018-12-28 ENCOUNTER — Encounter: Payer: Self-pay | Admitting: Family Medicine

## 2018-12-28 ENCOUNTER — Ambulatory Visit (INDEPENDENT_AMBULATORY_CARE_PROVIDER_SITE_OTHER): Payer: Medicaid Other | Admitting: Family Medicine

## 2018-12-28 ENCOUNTER — Ambulatory Visit: Payer: Self-pay | Admitting: Family Medicine

## 2018-12-28 ENCOUNTER — Ambulatory Visit: Payer: Self-pay | Admitting: Nutrition

## 2018-12-28 ENCOUNTER — Ambulatory Visit
Admission: RE | Admit: 2018-12-28 | Discharge: 2018-12-28 | Disposition: A | Discharge status: Home / Self Care | Payer: Medicaid Other | Source: Ambulatory Visit | Attending: Radiation Oncology | Admitting: Radiation Oncology

## 2018-12-28 VITALS — BP 126/84 | HR 86 | Temp 98.0°F | Ht 70.0 in | Wt 179.0 lb

## 2018-12-28 DIAGNOSIS — Z09 Encounter for follow-up examination after completed treatment for conditions other than malignant neoplasm: Secondary | ICD-10-CM

## 2018-12-28 DIAGNOSIS — L299 Pruritus, unspecified: Secondary | ICD-10-CM

## 2018-12-28 DIAGNOSIS — K219 Gastro-esophageal reflux disease without esophagitis: Secondary | ICD-10-CM | POA: Diagnosis not present

## 2018-12-28 DIAGNOSIS — I10 Essential (primary) hypertension: Secondary | ICD-10-CM

## 2018-12-28 DIAGNOSIS — C090 Malignant neoplasm of tonsillar fossa: Secondary | ICD-10-CM | POA: Diagnosis not present

## 2018-12-28 DIAGNOSIS — R070 Pain in throat: Secondary | ICD-10-CM | POA: Diagnosis not present

## 2018-12-28 DIAGNOSIS — C140 Malignant neoplasm of pharynx, unspecified: Secondary | ICD-10-CM

## 2018-12-28 DIAGNOSIS — Z8673 Personal history of transient ischemic attack (TIA), and cerebral infarction without residual deficits: Secondary | ICD-10-CM

## 2018-12-28 DIAGNOSIS — C01 Malignant neoplasm of base of tongue: Secondary | ICD-10-CM | POA: Diagnosis not present

## 2018-12-28 MED ORDER — OMEPRAZOLE 20 MG PO CPDR: 20.0000 mg | DELAYED_RELEASE_CAPSULE | Freq: Every day | ORAL | 5 refills | Status: DC

## 2018-12-28 MED ORDER — HYDROXYZINE HCL 10 MG PO TABS: 10.0000 mg | ORAL_TABLET | Freq: Three times a day (TID) | ORAL | 3 refills | Status: DC | PRN

## 2018-12-28 MED FILL — ?OMEPRAZOLE 20 MG CAPSULE D: 20 | 30 days supply | Qty: 30 | Fill #0

## 2018-12-28 MED FILL — hydrOXYzine HCL 10 MG TABS: 10 | 30 days supply | Qty: 90 | Fill #0
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
## 2018-12-28 NOTE — Patient Instructions (Addendum)
Omeprazole capsules (sprinkle caps) - Rx What is this medicine? OMEPRAZOLE (oh ME pray zol) prevents the production of acid in the stomach. It is used to treat gastroesophageal reflux disease (GERD), ulcers, certain bacteria in the stomach, inflammation of the esophagus, and Zollinger-Ellison Syndrome. It is also used to treat other conditions that cause too much stomach acid. This medicine may be used for other purposes; ask your health care provider or pharmacist if you have questions. COMMON BRAND NAME(S): Prilosec What should I tell my health care provider before I take this medicine? They need to know if you have any of these conditions: -liver disease -low levels of magnesium in the blood -lupus -an unusual or allergic reaction to omeprazole, other medicines, foods, dyes, or preservatives -pregnant or trying to get pregnant -breast-feeding How should I use this medicine? Take this medicine by mouth with a glass of water. Follow the directions on the prescription label. Do not crush, break or chew the capsules. They can be opened and the contents sprinkled on a small amount of applesauce or yogurt, given with fruit juices, or swallowed immediately with water. This medicine works best if taken on an empty stomach 30 to 60 minutes before breakfast. Take your doses at regular intervals. Do not take your medicine more often than directed. Talk to your pediatrician regarding the use of this medicine in children. Special care may be needed. Overdosage: If you think you have taken too much of this medicine contact a poison control center or emergency room at once. NOTE: This medicine is only for you. Do not share this medicine with others. What if I miss a dose? If you miss a dose, take it as soon as you can. If it is almost time for your next dose, take only that dose. Do not take double or extra doses. What may interact with this medicine? Do not take this medicine with any of the following  medications: -atazanavir -clopidogrel -nelfinavir This medicine may also interact with the following medications: -ampicillin -certain medicines for anxiety or sleep -certain medicines that treat or prevent blood clots like warfarin -cyclosporine -diazepam -digoxin -disulfiram -diuretics -iron salts -methotrexate -mycophenolate mofetil -phenytoin -prescription medicine for fungal or yeast infection like itraconazole, ketoconazole, voriconazole -saquinavir -tacrolimus This list may not describe all possible interactions. Give your health care provider a list of all the medicines, herbs, non-prescription drugs, or dietary supplements you use. Also tell them if you smoke, drink alcohol, or use illegal drugs. Some items may interact with your medicine. What should I watch for while using this medicine? It can take several days before your stomach pain gets better. Check with your doctor or health care professional if your condition does not start to get better, or if it gets worse. You may need blood work done while you are taking this medicine. This medicine may cause a decrease in vitamin B12. You should make sure that you get enough vitamin B12 while you are taking this medicine. Discuss the foods you eat and the vitamins you take with your health care professional. What side effects may I notice from receiving this medicine? Side effects that you should report to your doctor or health care professional as soon as possible: - allergic reactions like skin rash, itching or hives, swelling of the face, lips, or tongue - bone, muscle or joint pain - breathing problems - chest pain or chest tightness - dark yellow or brown urine - dizziness - fast, irregular heartbeat - feeling faint or lightheaded -  fever or sore throat - muscle spasm - palpitations - rash on cheeks or arms that gets worse in the sun - redness, blistering, peeling or loosening of the skin, including  inside the mouth - seizures -stomach polyps - tremors - unusual bleeding or bruising - unusually weak or tired - yellowing of the eyes or skin Side effects that usually do not require medical attention (report to your doctor or health care professional if they continue or are bothersome): - constipation - diarrhea - dry mouth - headache - nausea This list may not describe all possible side effects. Call your doctor for medical advice about side effects. You may report side effects to FDA at 1-800-FDA-1088. Where should I keep my medicine? Keep out of the reach of children. Store at room temperature between 15 and 30 degrees C (59 and 86 degrees F). Protect from light and moisture. Throw away any unused medicine after the expiration date. NOTE: This sheet is a summary. It may not cover all possible information. If you have questions about this medicine, talk to your doctor, pharmacist, or health care provider.  2019 Elsevier/Gold Standard (2017-07-31 13:27:37) Gastroesophageal Reflux Disease, Adult Gastroesophageal reflux (GER) happens when acid from the stomach flows up into the tube that connects the mouth and the stomach (esophagus). Normally, food travels down the esophagus and stays in the stomach to be digested. However, when a person has GER, food and stomach acid sometimes move back up into the esophagus. If this becomes a more serious problem, the person may be diagnosed with a disease called gastroesophageal reflux disease (GERD). GERD occurs when the reflux:  Happens often.  Causes frequent or severe symptoms.  Causes problems such as damage to the esophagus. When stomach acid comes in contact with the esophagus, the acid may cause soreness (inflammation) in the esophagus. Over time, GERD may create small holes (ulcers) in the lining of the esophagus. What are the causes? This condition is caused by a problem with the muscle between the esophagus and the stomach  (lower esophageal sphincter, or LES). Normally, the LES muscle closes after food passes through the esophagus to the stomach. When the LES is weakened or abnormal, it does not close properly, and that allows food and stomach acid to go back up into the esophagus. The LES can be weakened by certain dietary substances, medicines, and medical conditions, including:  Tobacco use.  Pregnancy.  Having a hiatal hernia.  Alcohol use.  Certain foods and beverages, such as coffee, chocolate, onions, and peppermint. What increases the risk? You are more likely to develop this condition if you:  Have an increased body weight.  Have a connective tissue disorder.  Use NSAID medicines. What are the signs or symptoms? Symptoms of this condition include:  Heartburn.  Difficult or painful swallowing.  The feeling of having a lump in the throat.  Abitter taste in the mouth.  Bad breath.  Having a large amount of saliva.  Having an upset or bloated stomach.  Belching.  Chest pain. Different conditions can cause chest pain. Make sure you see your health care provider if you experience chest pain.  Shortness of breath or wheezing.  Ongoing (chronic) cough or a night-time cough.  Wearing away of tooth enamel.  Weight loss. How is this diagnosed? Your health care provider will take a medical history and perform a physical exam. To determine if you have mild or severe GERD, your health care provider may also monitor how you respond to treatment.  You may also have tests, including:  A test to examine your stomach and esophagus with a small camera (endoscopy).  A test thatmeasures the acidity level in your esophagus.  A test thatmeasures how much pressure is on your esophagus.  A barium swallow or modified barium swallow test to show the shape, size, and functioning of your esophagus. How is this treated? The goal of treatment is to help relieve your symptoms and to prevent  complications. Treatment for this condition may vary depending on how severe your symptoms are. Your health care provider may recommend:  Changes to your diet.  Medicine.  Surgery. Follow these instructions at home: Eating and drinking   Follow a diet as recommended by your health care provider. This may involve avoiding foods and drinks such as: ? Coffee and tea (with or without caffeine). ? Drinks that containalcohol. ? Energy drinks and sports drinks. ? Carbonated drinks or sodas. ? Chocolate and cocoa. ? Peppermint and mint flavorings. ? Garlic and onions. ? Horseradish. ? Spicy and acidic foods, including peppers, chili powder, curry powder, vinegar, hot sauces, and barbecue sauce. ? Citrus fruit juices and citrus fruits, such as oranges, lemons, and limes. ? Tomato-based foods, such as red sauce, chili, salsa, and pizza with red sauce. ? Fried and fatty foods, such as donuts, french fries, potato chips, and high-fat dressings. ? High-fat meats, such as hot dogs and fatty cuts of red and white meats, such as rib eye steak, sausage, ham, and bacon. ? High-fat dairy items, such as whole milk, butter, and cream cheese.  Eat small, frequent meals instead of large meals.  Avoid drinking large amounts of liquid with your meals.  Avoid eating meals during the 2-3 hours before bedtime.  Avoid lying down right after you eat.  Do not exercise right after you eat. Lifestyle   Do not use any products that contain nicotine or tobacco, such as cigarettes, e-cigarettes, and chewing tobacco. If you need help quitting, ask your health care provider.  Try to reduce your stress by using methods such as yoga or meditation. If you need help reducing stress, ask your health care provider.  If you are overweight, reduce your weight to an amount that is healthy for you. Ask your health care provider for guidance about a safe weight loss goal. General instructions  Pay attention to any  changes in your symptoms.  Take over-the-counter and prescription medicines only as told by your health care provider. Do not take aspirin, ibuprofen, or other NSAIDs unless your health care provider told you to do so.  Wear loose-fitting clothing. Do not wear anything tight around your waist that causes pressure on your abdomen.  Raise (elevate) the head of your bed about 6 inches (15 cm).  Avoid bending over if this makes your symptoms worse.  Keep all follow-up visits as told by your health care provider. This is important. Contact a health care provider if:  You have: ? New symptoms. ? Unexplained weight loss. ? Difficulty swallowing or it hurts to swallow. ? Wheezing or a persistent cough. ? A hoarse voice.  Your symptoms do not improve with treatment. Get help right away if you:  Have pain in your arms, neck, jaw, teeth, or back.  Feel sweaty, dizzy, or light-headed.  Have chest pain or shortness of breath.  Vomit and your vomit looks like blood or coffee grounds.  Faint.  Have stool that is bloody or black.  Cannot swallow, drink, or eat. Summary  Gastroesophageal reflux happens when acid from the stomach flows up into the esophagus. GERD is a disease in which the reflux happens often, causes frequent or severe symptoms, or causes problems such as damage to the esophagus.  Treatment for this condition may vary depending on how severe your symptoms are. Your health care provider may recommend diet and lifestyle changes, medicine, or surgery.  Contact a health care provider if you have new or worsening symptoms.  Take over-the-counter and prescription medicines only as told by your health care provider. Do not take aspirin, ibuprofen, or other NSAIDs unless your health care provider told you to do so.  Keep all follow-up visits as told by your health care provider. This is important. This information is not intended to replace advice given to you by your health  care provider. Make sure you discuss any questions you have with your health care provider. Document Released: 09/24/2005 Document Revised: 06/23/2018 Document Reviewed: 06/23/2018 Elsevier Interactive Patient Education  2019 [place]. Hydroxyzine capsules or tablets What is this medicine? HYDROXYZINE (hye Preston i zeen) is an antihistamine. This medicine is used to treat allergy symptoms. It is also used to treat anxiety and tension. This medicine can be used with other medicines to induce sleep before surgery. This medicine may be used for other purposes; ask your health care provider or pharmacist if you have questions. COMMON BRAND NAME(S): ANX, Atarax, Rezine, Vistaril What should I tell my health care provider before I take this medicine? They need to know if you have any of these conditions: -glaucoma -heart disease -history of irregular heartbeat -kidney disease -liver disease -lung or breathing disease, like asthma -stomach or intestine problems -thyroid disease -trouble passing urine -an unusual or allergic reaction to hydroxyzine, cetirizine, other medicines, foods, dyes or preservatives -pregnant or trying to get pregnant -breast-feeding How should I use this medicine? Take this medicine by mouth with a full glass of water. Follow the directions on the prescription label. You may take this medicine with food or on an empty stomach. Take your medicine at regular intervals. Do not take your medicine more often than directed. Talk to your pediatrician regarding the use of this medicine in children. Special care may be needed. While this drug may be prescribed for children as young as 78 years of age for selected conditions, precautions do apply. Patients over 57 years old may have a stronger reaction and need a smaller dose. Overdosage: If you think you have taken too much of this medicine contact a poison control center or emergency room at once. NOTE: This medicine is only for  you. Do not share this medicine with others. What if I miss a dose? If you miss a dose, take it as soon as you can. If it is almost time for your next dose, take only that dose. Do not take double or extra doses. What may interact with this medicine? Do not take this medicine with any of the following medications: -cisapride -dofetilide -dronedarone -pimozide -thioridazine This medicine may also interact with the following medications: -alcohol -antihistamines for allergy, cough, and cold -atropine -barbiturate medicines for sleep or seizures, like phenobarbital -certain antibiotics like erythromycin or clarithromycin -certain medicines for anxiety or sleep -certain medicines for bladder problems like oxybutynin, tolterodine -certain medicines for depression or psychotic disturbances -certain medicines for irregular heart beat -certain medicines for Parkinson's disease like benztropine, trihexyphenidyl -certain medicines for seizures like phenobarbital, primidone -certain medicines for stomach problems like dicyclomine, hyoscyamine -certain medicines for travel  sickness like scopolamine -ipratropium -narcotic medicines for pain -other medicines that prolong the QT interval (an abnormal heart rhythm) This list may not describe all possible interactions. Give your health care provider a list of all the medicines, herbs, non-prescription drugs, or dietary supplements you use. Also tell them if you smoke, drink alcohol, or use illegal drugs. Some items may interact with your medicine. What should I watch for while using this medicine? Tell your doctor or health care professional if your symptoms do not improve. You may get drowsy or dizzy. Do not drive, use machinery, or do anything that needs mental alertness until you know how this medicine affects you. Do not stand or sit up quickly, especially if you are an older patient. This reduces the risk of dizzy or fainting spells. Alcohol may  interfere with the effect of this medicine. Avoid alcoholic drinks. Your mouth may get dry. Chewing sugarless gum or sucking hard candy, and drinking plenty of water may help. Contact your doctor if the problem does not go away or is severe. This medicine may cause dry eyes and blurred vision. If you wear contact lenses you may feel some discomfort. Lubricating drops may help. See your eye doctor if the problem does not go away or is severe. If you are receiving skin tests for allergies, tell your doctor you are using this medicine. What side effects may I notice from receiving this medicine? Side effects that you should report to your doctor or health care professional as soon as possible: -allergic reactions like skin rash, itching or hives, swelling of the face, lips, or tongue -changes in vision -confusion -fast, irregular heartbeat -seizures -tremor -trouble passing urine or change in the amount of urine Side effects that usually do not require medical attention (report to your doctor or health care professional if they continue or are bothersome): -constipation -drowsiness -dry mouth -headache -tiredness This list may not describe all possible side effects. Call your doctor for medical advice about side effects. You may report side effects to FDA at 1-800-FDA-1088. Where should I keep my medicine? Keep out of the reach of children. Store at room temperature between 15 and 30 degrees C (59 and 86 degrees F). Keep container tightly closed. Throw away any unused medicine after the expiration date. NOTE: This sheet is a summary. It may not cover all possible information. If you have questions about this medicine, talk to your doctor, pharmacist, or health care provider.  2019 Elsevier/Gold Standard (2018-06-28 13:25:13)

## 2018-12-28 NOTE — Progress Notes (Signed)
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 Patient continues to eat soft foods.  He tolerates tea but does not tolerate water by mouth. Patient reports he is pouring extra water in his feeding tube. Patient continues Osmolite 1.5, 4 bottles daily. His weight decreased slightly and was documented as 178.4 pounds today down from 180.4 pounds 1 week ago. Patient denies nausea, vomiting, diarrhea, and constipation.  Nutrition diagnosis: Inadequate oral intake continues.  Estimated nutrition needs: 2200-2400 cal, 100-120 g protein, 2.2 L fluid.  Intervention: Patient educated to continue Osmolite 1.5 via feeding tube.   Recommended a minimum of 4 bottles daily as long as oral intake continues and weight does not decline. Patient understands to increase Osmolite 1.5-6 bottles daily if he cannot consume oral intake. Teach back method used.  Monitoring, evaluation, goals: Patient will continue oral intake with Osmolite 1.5 to promote weight maintenance.  Next visit: Tuesday, January 7.  **Disclaimer: This note was dictated with voice recognition software. Similar sounding words can inadvertently be transcribed and this note may contain transcription errors which may not have been corrected upon publication of note.**

## 2018-12-30 ENCOUNTER — Ambulatory Visit
Admission: RE | Admit: 2018-12-30 | Discharge: 2018-12-30 | Disposition: A | Discharge status: Home / Self Care | Payer: Medicaid Other | Source: Ambulatory Visit | Attending: Radiation Oncology | Admitting: Radiation Oncology

## 2018-12-30 DIAGNOSIS — C090 Malignant neoplasm of tonsillar fossa: Secondary | ICD-10-CM | POA: Diagnosis not present

## 2018-12-30 DIAGNOSIS — C01 Malignant neoplasm of base of tongue: Secondary | ICD-10-CM | POA: Diagnosis not present

## 2018-12-31 ENCOUNTER — Ambulatory Visit
Admission: RE | Admit: 2018-12-31 | Discharge: 2018-12-31 | Disposition: A | Discharge status: Home / Self Care | Payer: Medicaid Other | Source: Ambulatory Visit | Attending: Radiation Oncology | Admitting: Radiation Oncology

## 2018-12-31 DIAGNOSIS — C090 Malignant neoplasm of tonsillar fossa: Secondary | ICD-10-CM | POA: Diagnosis not present

## 2018-12-31 DIAGNOSIS — C01 Malignant neoplasm of base of tongue: Secondary | ICD-10-CM | POA: Diagnosis not present

## 2019-01-03 ENCOUNTER — Ambulatory Visit
Admission: RE | Admit: 2019-01-03 | Discharge: 2019-01-03 | Disposition: A | Discharge status: Home / Self Care | Payer: Medicaid Other | Source: Ambulatory Visit | Attending: Radiation Oncology | Admitting: Radiation Oncology

## 2019-01-03 ENCOUNTER — Other Ambulatory Visit: Payer: Self-pay | Admitting: Radiation Oncology

## 2019-01-03 DIAGNOSIS — C090 Malignant neoplasm of tonsillar fossa: Secondary | ICD-10-CM | POA: Diagnosis not present

## 2019-01-03 DIAGNOSIS — C01 Malignant neoplasm of base of tongue: Secondary | ICD-10-CM

## 2019-01-03 MED ORDER — SUCRALFATE 1 G PO TABS: ORAL_TABLET | ORAL | 5 refills | Status: DC

## 2019-01-03 MED FILL — SUCRALFATE 1 GM TABLET: 1 | 10 days supply | Qty: 40 | Fill #0

## 2019-01-03 MED FILL — ?ATORVASTATIN 40MG TABLET: 40 | 30 days supply | Qty: 30 | Fill #2

## 2019-01-04 ENCOUNTER — Ambulatory Visit
Admission: RE | Admit: 2019-01-04 | Discharge: 2019-01-04 | Disposition: A | Discharge status: Home / Self Care | Payer: Medicaid Other | Source: Ambulatory Visit | Attending: Radiation Oncology | Admitting: Radiation Oncology

## 2019-01-04 ENCOUNTER — Inpatient Hospital Stay: Payer: Self-pay | Admitting: Nutrition

## 2019-01-04 DIAGNOSIS — C090 Malignant neoplasm of tonsillar fossa: Secondary | ICD-10-CM | POA: Diagnosis not present

## 2019-01-04 DIAGNOSIS — C01 Malignant neoplasm of base of tongue: Secondary | ICD-10-CM | POA: Diagnosis not present

## 2019-01-04 NOTE — Progress Notes (Signed)
Nutrition follow-up completed with patient and his father. Patient continues radiation therapy for base of tongue cancer. Patient states he does not feel well today. He reports nothing has changed.  He continues to eat soft foods and use 4 bottles of Osmolite 1.5 via tube daily. He continues to drink tea and water throughout the day and pours extra water in his feeding tube. Weight decreased to 175.8 pounds today down from 178.4 pounds last week. Patient denies nausea, vomiting, diarrhea and constipation.  Nutrition diagnosis: Inadequate oral intake continues.  Estimated nutrition needs: 2200-2400 cal, 100-120 g protein, 2.2 L fluid.  Intervention: I educated patient to increase oral intake throughout the day. Encourage patient to increase Osmolite 1.5-5 bottles daily if he is not able to increase oral intake secondary to weight loss. Teach back method used.  Monitoring, evaluation, goals: Patient will work to increase oral intake/tube feeding to minimize further weight loss.  Next visit: Tuesday, January 14 after radiation therapy.  **Disclaimer: This note was dictated with voice recognition software. Similar sounding words can inadvertently be transcribed and this note may contain transcription errors which may not have been corrected upon publication of note.**

## 2019-01-05 ENCOUNTER — Ambulatory Visit: Payer: Medicaid Other

## 2019-01-05 ENCOUNTER — Ambulatory Visit
Admission: RE | Admit: 2019-01-05 | Discharge: 2019-01-05 | Disposition: A | Discharge status: Home / Self Care | Payer: Medicaid Other | Source: Ambulatory Visit | Attending: Radiation Oncology | Admitting: Radiation Oncology

## 2019-01-05 DIAGNOSIS — C090 Malignant neoplasm of tonsillar fossa: Secondary | ICD-10-CM | POA: Diagnosis not present

## 2019-01-05 DIAGNOSIS — C01 Malignant neoplasm of base of tongue: Secondary | ICD-10-CM | POA: Diagnosis not present

## 2019-01-06 ENCOUNTER — Ambulatory Visit
Admission: RE | Admit: 2019-01-06 | Discharge: 2019-01-06 | Disposition: A | Discharge status: Home / Self Care | Payer: Medicaid Other | Source: Ambulatory Visit | Attending: Radiation Oncology | Admitting: Radiation Oncology

## 2019-01-06 ENCOUNTER — Ambulatory Visit: Payer: Medicaid Other

## 2019-01-06 DIAGNOSIS — C090 Malignant neoplasm of tonsillar fossa: Secondary | ICD-10-CM | POA: Diagnosis not present

## 2019-01-06 DIAGNOSIS — C01 Malignant neoplasm of base of tongue: Secondary | ICD-10-CM | POA: Diagnosis not present

## 2019-01-07 ENCOUNTER — Ambulatory Visit: Payer: Medicaid Other

## 2019-01-07 ENCOUNTER — Ambulatory Visit
Admission: RE | Admit: 2019-01-07 | Discharge: 2019-01-07 | Disposition: A | Discharge status: Home / Self Care | Payer: Medicaid Other | Source: Ambulatory Visit | Attending: Radiation Oncology | Admitting: Radiation Oncology

## 2019-01-07 DIAGNOSIS — C090 Malignant neoplasm of tonsillar fossa: Secondary | ICD-10-CM | POA: Diagnosis not present

## 2019-01-07 DIAGNOSIS — C01 Malignant neoplasm of base of tongue: Secondary | ICD-10-CM | POA: Diagnosis not present

## 2019-01-10 ENCOUNTER — Ambulatory Visit: Payer: Medicaid Other

## 2019-01-10 ENCOUNTER — Ambulatory Visit
Admission: RE | Admit: 2019-01-10 | Discharge: 2019-01-10 | Disposition: A | Discharge status: Home / Self Care | Payer: Medicaid Other | Source: Ambulatory Visit | Attending: Radiation Oncology | Admitting: Radiation Oncology

## 2019-01-10 DIAGNOSIS — C01 Malignant neoplasm of base of tongue: Secondary | ICD-10-CM | POA: Diagnosis not present

## 2019-01-10 DIAGNOSIS — C090 Malignant neoplasm of tonsillar fossa: Secondary | ICD-10-CM | POA: Diagnosis not present

## 2019-01-10 MED FILL — LIDOCAINE 2% VISCOUS SOLN: 2 | 5 days supply | Qty: 100 | Fill #2

## 2019-01-11 ENCOUNTER — Ambulatory Visit: Payer: Medicaid Other

## 2019-01-11 ENCOUNTER — Ambulatory Visit
Admission: RE | Admit: 2019-01-11 | Discharge: 2019-01-11 | Disposition: A | Discharge status: Home / Self Care | Payer: Medicaid Other | Source: Ambulatory Visit | Attending: Radiation Oncology | Admitting: Radiation Oncology

## 2019-01-11 ENCOUNTER — Inpatient Hospital Stay: Payer: Medicaid Other | Attending: Hematology | Admitting: Nutrition

## 2019-01-11 DIAGNOSIS — C01 Malignant neoplasm of base of tongue: Secondary | ICD-10-CM | POA: Diagnosis not present

## 2019-01-11 DIAGNOSIS — C090 Malignant neoplasm of tonsillar fossa: Secondary | ICD-10-CM | POA: Diagnosis not present

## 2019-01-11 NOTE — Progress Notes (Signed)
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 Weight is stable and documented as 175.2 pounds. Patient continues to eat soft foods and use 4 bottles of Osmolite 1.5 via feeding tube daily. Patient denies nutrition impact symptoms.  Nutrition diagnosis: Inadequate oral intake continues.  Estimated nutrition needs: 2200-2400 cal, 100-120 g protein, 2.2 L fluid.  Intervention: Recommended patient continue strategies for adequate calorie and protein intake as well as Osmolite 1.5 via tube. Patient has good understanding of foods to eat and how to administer tube feedings. Recommended patient or family contact me for any further questions or concerns.  Monitoring, evaluation, goals: Patient will continue oral intake and tube feedings to promote healing and weight maintenance.  No follow-up has been scheduled.  I am available as needed.  **Disclaimer: This note was dictated with voice recognition software. Similar sounding words can inadvertently be transcribed and this note may contain transcription errors which may not have been corrected upon publication of note.**

## 2019-01-12 ENCOUNTER — Encounter: Payer: Self-pay | Admitting: *Deleted

## 2019-01-12 ENCOUNTER — Ambulatory Visit
Admission: RE | Admit: 2019-01-12 | Discharge: 2019-01-12 | Disposition: A | Discharge status: Home / Self Care | Payer: Medicaid Other | Source: Ambulatory Visit | Attending: Radiation Oncology | Admitting: Radiation Oncology

## 2019-01-12 ENCOUNTER — Ambulatory Visit: Payer: Medicaid Other

## 2019-01-12 DIAGNOSIS — C01 Malignant neoplasm of base of tongue: Secondary | ICD-10-CM | POA: Diagnosis not present

## 2019-01-12 DIAGNOSIS — C090 Malignant neoplasm of tonsillar fossa: Secondary | ICD-10-CM | POA: Diagnosis not present

## 2019-01-12 NOTE — Progress Notes (Signed)
Oncology Nurse Navigator Documentation  Met with Mr. Forget after final RT to offer support and to celebrate end of radiation treatment.  He was accompanied by his dad. Provided each parent Certificate of Recognition for their supportive care. Provided verbal/written post-RT guidance:  Importance of keeping all follow-up appts, especially those with Nutrition and SLP.  Importance of protecting treatment area from sun.  Continuation of Sonafine application 2-3 times daily until supply exhausted after which transition to OTC lotion with vitamin E. Explained my role as navigator will continue for several more months and that I will be calling and/or joining him during follow-up visits.   I encouraged him to call me with needs/concerns.   Patient and dad verbalized understanding of information provided.  [name], RN, BSN [facility] [place] [phone]

## 2019-01-13 ENCOUNTER — Ambulatory Visit: Admission: RE | Admit: 2019-01-13 | Payer: Medicaid Other | Source: Ambulatory Visit

## 2019-01-14 ENCOUNTER — Encounter: Payer: Self-pay | Admitting: Radiation Oncology

## 2019-01-14 NOTE — Progress Notes (Signed)
  Radiation Oncology         [phone] ________________________________  Name: Brian Dixon MRN: [id number]  Date: 01/14/2019  DOB: 1966-07-28  End of Treatment Note  Diagnosis:   Cancer Staging 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[place]) Staging form: Pharynx - P16 Negative Oropharynx, AJCC 8th Edition - Clinical stage from 09/07/2018: Stage III (cT3, cN0, cM0, p16-) - Signed by Eppie Gibson, MD on 09/07/2018 - Pathologic: Stage III (pT3, pN0, cM0, p16-) - Signed by Eppie Gibson, MD on 11/05/2018      Indication for treatment:  Curative       Radiation treatment dates:   11/29/2018-01/12/2019  Site/dose:     Oropharynx and tongue / 60 Gy in 30 fractions to tumor bed with margin (entire neck not treated)  Beams/energy:   IMRT / 6 MV photons  Narrative: The patient tolerated radiation treatment relatively well.  At the beginning of his radiation therapy, he noted tongue swelling, thick saliva. He was provided with Sonafine and instructed on its use. On PE, it was noted that the tongue has healed well, mildly swollen, but not more than expected post-op. Scar on neck is well approximated; + upper neck edema. Towards the end of his treatment, he noted that he is only consuming tea PO, otherwise he is instilling 4 cartons daily through his PEG. He had throat/mouth/tongue pain (using carafate with mild relief), skin to radiation site was red and he was noted to be using sonafine BID. On PE, it was noted mild mucositis in mouth, erythema of skin, RT fields with darkening and irritation at lateral commissures of lips.   Plan: The patient has completed radiation treatment. The patient will return to radiation oncology clinic for routine followup in one half month. I advised the patient to call or return sooner if any questions or concerns arise that are related to recovery or treatment.  -----------------------------------  Eppie Gibson, MD  This document serves as a record of services  personally performed by Eppie Gibson, MD. It was created on her behalf by Steva Colder, a trained medical scribe. The creation of this record is based on the scribe's personal observations and the provider's statements to them. This document has been checked and approved by the attending provider.

## 2019-01-26 ENCOUNTER — Encounter: Payer: Self-pay | Admitting: Radiation Oncology

## 2019-01-26 NOTE — Progress Notes (Signed)
Mr. Devins presents for follow up of radiation completed 01/12/19 to his Oropharynx and tongue.   Pain issues, if any: He reports pain when swallowing.  Using a feeding tube?: Yes, around 2 cartons of supplement daily.  Weight changes, if any:  Wt Readings from Last 3 Encounters:  01/28/19 179 lb 6 oz (81.4 kg)  01/11/19 175 lb 3.2 oz (79.5 kg)  01/04/19 175 lb 12.8 oz (79.7 kg)   Swallowing issues, if any: He has difficulty eating certain foods like ice cream, certain chocolate items. He is able to eat softer foods. He does tell me that food will get stuck in his throat at times and he needs to cough it back up.  Smoking or chewing tobacco? He tells me 1-2 daily.  Using fluoride trays daily?  Last ENT visit was on:  Other notable issues, if any:  He has swelling to his tongue, and right/anterior neck.  He has burning to the tip of his tongue when eating certain foods.   BP (!) 152/95 (BP Location: Left Arm, Patient Position: Sitting)   Pulse 67   Temp 98 F (36.7 C) (Oral)   Resp 18   Ht 5\' 9"  (1.753 m)   Wt 179 lb 6 oz (81.4 kg)   SpO2 100%   BMI 26.49 kg/m

## 2019-01-28 ENCOUNTER — Other Ambulatory Visit: Payer: Self-pay

## 2019-01-28 ENCOUNTER — Encounter: Payer: Self-pay | Admitting: Radiation Oncology

## 2019-01-28 ENCOUNTER — Ambulatory Visit
Admission: RE | Admit: 2019-01-28 | Discharge: 2019-01-28 | Disposition: A | Discharge status: Home / Self Care | Payer: Medicaid Other | Source: Ambulatory Visit | Attending: Radiation Oncology | Admitting: Radiation Oncology

## 2019-01-28 DIAGNOSIS — Z7982 Long term (current) use of aspirin: Secondary | ICD-10-CM | POA: Insufficient documentation

## 2019-01-28 DIAGNOSIS — Z923 Personal history of irradiation: Secondary | ICD-10-CM | POA: Insufficient documentation

## 2019-01-28 DIAGNOSIS — C090 Malignant neoplasm of tonsillar fossa: Secondary | ICD-10-CM | POA: Diagnosis not present

## 2019-01-28 DIAGNOSIS — Z79899 Other long term (current) drug therapy: Secondary | ICD-10-CM | POA: Insufficient documentation

## 2019-01-28 HISTORY — DX: Personal history of irradiation: Z92.3

## 2019-01-28 NOTE — Progress Notes (Signed)
Radiation Oncology         [phone]) [phone] ________________________________  Name: Brian Dixon MRN: [id number]  Date: 01/28/2019  DOB: [birth year]  Follow-Up Visit Note  CC: Azzie Glatter, FNP  Melida Quitter, MD  Diagnosis and Prior Radiotherapy:        ICD-10-CM   1. 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[place]) C09.0     Cancer Staging 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[place]) Staging form: Pharynx - P16 Negative Oropharynx, AJCC 8th Edition - Clinical stage from 09/07/2018: Stage III (cT3, cN0, cM0, p16-) - Signed by Eppie Gibson, MD on 09/07/2018 - Pathologic: Stage III (pT3, pN0, cM0, p16-) - Signed by Eppie Gibson, MD on 11/05/2018  Radiation treatment dates:   11/29/2018-01/12/2019  Site/dose:     Oropharynx and tongue / total dose, 60Gy in 30 fractions  CHIEF COMPLAINT:  Here for follow-up and surveillance of oropharynx and tongue cancer  Narrative:  The patient returns today for routine follow-up. He is accompanied by his family today. He is able to eat softer foods. He does continue with follow up with SLP, Glendell Docker and his neck appointment will be on Monday, 01/31/2019.   He is not requiring any pain medications and he is only using lidocaine for his sore throat. He has both dry mouth (worse in the morning) and thick saliva. He consumes water in the morning to help with his symptoms. He hasn't tried any biotene products. He notes neck tightness sensation. He has started to work out on his own. He notes that the biggest problem that he has is that he is having pain with abducting his right shoulder s/p neck surgery. He reports that ice cream burns his throat. He is able to drink tea and eat eggs.                  Pain issues, if any: He reports pain when swallowing.  Using a feeding tube?: Yes, around 2 cartons of supplement daily.  Weight changes, if any:      Wt Readings from Last 3 Encounters:  01/28/19 179 lb 6 oz (81.4 kg)  01/11/19 175 lb 3.2 oz (79.5 kg)    01/04/19 175 lb 12.8 oz (79.7 kg)   Swallowing issues, if any: He has difficulty eating certain foods like ice cream, certain chocolate items. He is able to eat softer foods. He does tell me that food will get stuck in his throat at times and he needs to cough it back up.  Smoking or chewing tobacco? He tells me 1-2 daily.  Other notable issues, if any: He has swelling to his tongue, and right/anterior neck. He has burning to the tip of his tongue when eating certain foods.    ALLERGIES:  is allergic to shellfish allergy.  Meds: Current Outpatient Medications  Medication Sig Dispense Refill  . aspirin 325 MG tablet Take 1 tablet (325 mg total) by mouth daily. 30 tablet 2  . cyanocobalamin 1000 MCG tablet Take 1 tablet (1,000 mcg total) by mouth daily. 30 tablet 2  . hydrOXYzine (ATARAX/VISTARIL) 10 MG tablet Take 1 tablet (10 mg total) by mouth 3 (three) times daily as needed. 90 tablet 3  . lidocaine (XYLOCAINE) 2 % solution Patient: Mix 1part 2% viscous lidocaine, 1part H20. Swish and spit 39mL of diluted mixture, up to every 2 hours PRN mouth pain. 200 mL 4  . magic mouthwash w/lidocaine SOLN Take 10 mLs by mouth.    . Nutritional Supplements (FEEDING SUPPLEMENT, OSMOLITE 1.5  CAL,) LIQD Change Peptamen 1.5 to Osmolite 1.5 via G-tube. Give 1.5 bottles QID with 60 mL free water flush before and after bolus.Give 240 mL free water TID between TF. 6 Bottle 0  . omeprazole (PRILOSEC) 20 MG capsule Take 1 capsule (20 mg total) by mouth daily. 30 capsule 5  . sucralfate (CARAFATE) 1 g tablet Dissolve 1 tablet in 10 mL H20 and swallow up to QID prn sore throat. 40 tablet 5  . atorvastatin (LIPITOR) 40 MG tablet Take 1 tablet (40 mg total) by mouth daily at 6 PM. 30 tablet 2  . guaiFENesin (MUCINEX) 600 MG 12 hr tablet Take 1 tablet (600 mg total) by mouth 2 (two) times daily. (Patient not taking: Reported on 12/28/2018) 60 tablet 3   No current facility-administered medications for this encounter.      Physical Findings: The patient is in no acute distress. Patient is alert and oriented. Wt Readings from Last 3 Encounters:  01/28/19 179 lb 6 oz (81.4 kg)  01/11/19 175 lb 3.2 oz (79.5 kg)  01/04/19 175 lb 12.8 oz (79.7 kg)    height is 5\' 9"  (1.753 m) and weight is 179 lb 6 oz (81.4 kg). His oral temperature is 98 F (36.7 C). His blood pressure is 152/95 (abnormal) and his pulse is 67. His respiration is 18 and oxygen saturation is 100%. .  General: Alert and oriented, in no acute distress HEENT: Head is normocephalic. Extraocular movements are intact. Edentulous. Oropharynx is notable for the tissue graft reflects the prior tattoo from his forearm, mucous membranes are moist. Mucositis resolving nicely. No thrush.  Neck: Neck is notable for mild lymphedema in the submental region.  Skin: Skin in treatment fields shows satisfactory healing Heart: Regular in rate and rhythm with no murmurs, rubs, or gallops. Chest: Clear to auscultation bilaterally, with no rhonchi, wheezes, or rales. Abdomen: Soft, nontender, nondistended, with no rigidity or guarding. Extremities: No cyanosis or edema. Lymphatics: see Neck Exam Psychiatric: Judgment and insight are intact. Affect is appropriate.   Lab Findings: Lab Results  Component Value Date   WBC 8.0 08/24/2018   HGB 14.2 08/24/2018   HCT 42.2 08/24/2018   MCV 102 (H) 08/24/2018   PLT 291 08/24/2018    Lab Results  Component Value Date   TSH 4.600 (H) 08/24/2018    Radiographic Findings: No results found.  Impression/Plan:    1) Head and Neck Cancer Status: Healing well from radiation therapy  2) Nutritional Status: doing well with oral intake and feeding tube.  PEG tube: Yes, around 2 cartons of supplement daily.   3) Risk Factors: The patient has been educated about risk factors including alcohol and tobacco abuse; they understand that avoidance of alcohol and tobacco is important to prevent recurrences as well as other  cancers. Unfortunately, he is still smoking.   4) Swallowing: following with SLP, able to swallow soft foods.   5) Dental: Edentulous.   6) Thyroid function: will recheck at next visit Lab Results  Component Value Date   TSH 4.600 (H) 08/24/2018    7) Other: Encouraged the patient to use OTC biotene to aid with dry mouth. Will refer the patient to PT due to neck lymphedema and to help with improving overall stamina and strength.    8) Maintain follow up with Dr. Vertell Limber at [facility]. Will order CT neck and CT chest to be completed in 2.5 months with an office visit following in Radiation Oncology. The patient was encouraged to  call with any issues or questions before then.     _____________________________________   Eppie Gibson, MD  This document serves as a record of services personally performed by Eppie Gibson, MD. It was created on her behalf by Steva Colder, a trained medical scribe. The creation of this record is based on the scribe's personal observations and the provider's statements to them. This document has been checked and approved by the attending provider.

## 2019-01-31 ENCOUNTER — Ambulatory Visit: Payer: Medicaid Other | Attending: Radiation Oncology

## 2019-01-31 ENCOUNTER — Other Ambulatory Visit: Payer: Self-pay | Admitting: Family Medicine

## 2019-01-31 DIAGNOSIS — R1310 Dysphagia, unspecified: Secondary | ICD-10-CM

## 2019-01-31 DIAGNOSIS — M25611 Stiffness of right shoulder, not elsewhere classified: Secondary | ICD-10-CM | POA: Insufficient documentation

## 2019-01-31 DIAGNOSIS — L599 Disorder of the skin and subcutaneous tissue related to radiation, unspecified: Secondary | ICD-10-CM | POA: Diagnosis present

## 2019-01-31 DIAGNOSIS — H52223 Regular astigmatism, bilateral: Secondary | ICD-10-CM | POA: Diagnosis not present

## 2019-01-31 DIAGNOSIS — M25612 Stiffness of left shoulder, not elsewhere classified: Secondary | ICD-10-CM | POA: Insufficient documentation

## 2019-01-31 DIAGNOSIS — I890 Lymphedema, not elsewhere classified: Secondary | ICD-10-CM | POA: Diagnosis present

## 2019-01-31 DIAGNOSIS — R471 Dysarthria and anarthria: Secondary | ICD-10-CM

## 2019-01-31 DIAGNOSIS — R293 Abnormal posture: Secondary | ICD-10-CM | POA: Insufficient documentation

## 2019-01-31 DIAGNOSIS — Z8673 Personal history of transient ischemic attack (TIA), and cerebral infarction without residual deficits: Secondary | ICD-10-CM

## 2019-01-31 DIAGNOSIS — E785 Hyperlipidemia, unspecified: Secondary | ICD-10-CM

## 2019-01-31 DIAGNOSIS — H5203 Hypermetropia, bilateral: Secondary | ICD-10-CM | POA: Diagnosis not present

## 2019-01-31 DIAGNOSIS — H524 Presbyopia: Secondary | ICD-10-CM | POA: Diagnosis not present

## 2019-01-31 MED FILL — ?OMEPRAZOLE 20 MG CAPSULE D: 20 | 30 days supply | Qty: 30 | Fill #1
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
## 2019-02-01 ENCOUNTER — Ambulatory Visit: Payer: Self-pay

## 2019-02-02 ENCOUNTER — Other Ambulatory Visit: Payer: Self-pay | Admitting: Radiation Oncology

## 2019-02-02 ENCOUNTER — Telehealth: Payer: Self-pay

## 2019-02-02 ENCOUNTER — Encounter: Payer: Self-pay | Admitting: Radiation Oncology

## 2019-02-02 DIAGNOSIS — Z8673 Personal history of transient ischemic attack (TIA), and cerebral infarction without residual deficits: Secondary | ICD-10-CM

## 2019-02-02 DIAGNOSIS — C01 Malignant neoplasm of base of tongue: Secondary | ICD-10-CM

## 2019-02-02 DIAGNOSIS — Z1329 Encounter for screening for other suspected endocrine disorder: Secondary | ICD-10-CM

## 2019-02-02 DIAGNOSIS — E785 Hyperlipidemia, unspecified: Secondary | ICD-10-CM

## 2019-02-02 MED ORDER — ATORVASTATIN CALCIUM 40 MG PO TABS: 40.0000 mg | ORAL_TABLET | Freq: Every day | ORAL | 2 refills | Status: DC

## 2019-02-02 MED FILL — ?ATORVASTATIN 40MG TABLET: 40 | 30 days supply | Qty: 30 | Fill #0

## 2019-02-02 NOTE — Telephone Encounter (Signed)
Medication sent to pharmacy  

## 2019-02-11 ENCOUNTER — Encounter: Payer: Self-pay | Admitting: Physical Therapy

## 2019-02-11 ENCOUNTER — Other Ambulatory Visit: Payer: Self-pay

## 2019-02-11 ENCOUNTER — Ambulatory Visit: Payer: Medicaid Other | Admitting: Physical Therapy

## 2019-02-11 DIAGNOSIS — I890 Lymphedema, not elsewhere classified: Secondary | ICD-10-CM

## 2019-02-11 DIAGNOSIS — R1310 Dysphagia, unspecified: Secondary | ICD-10-CM | POA: Diagnosis not present

## 2019-02-11 DIAGNOSIS — R293 Abnormal posture: Secondary | ICD-10-CM

## 2019-02-11 DIAGNOSIS — M25612 Stiffness of left shoulder, not elsewhere classified: Secondary | ICD-10-CM

## 2019-02-11 DIAGNOSIS — M25611 Stiffness of right shoulder, not elsewhere classified: Secondary | ICD-10-CM

## 2019-02-11 DIAGNOSIS — L599 Disorder of the skin and subcutaneous tissue related to radiation, unspecified: Secondary | ICD-10-CM
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
## 2019-02-24 ENCOUNTER — Ambulatory Visit: Payer: Medicaid Other | Admitting: Physical Therapy

## 2019-02-24 ENCOUNTER — Other Ambulatory Visit: Payer: Self-pay

## 2019-02-24 ENCOUNTER — Encounter: Payer: Self-pay | Admitting: Physical Therapy

## 2019-02-24 DIAGNOSIS — I890 Lymphedema, not elsewhere classified: Secondary | ICD-10-CM

## 2019-02-24 DIAGNOSIS — R1310 Dysphagia, unspecified: Secondary | ICD-10-CM | POA: Diagnosis not present
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
## 2019-02-25 DIAGNOSIS — Z87891 Personal history of nicotine dependence: Secondary | ICD-10-CM | POA: Diagnosis not present

## 2019-02-25 DIAGNOSIS — Z85810 Personal history of malignant neoplasm of tongue: Secondary | ICD-10-CM | POA: Diagnosis not present

## 2019-02-25 DIAGNOSIS — Z08 Encounter for follow-up examination after completed treatment for malignant neoplasm: Secondary | ICD-10-CM | POA: Diagnosis not present

## 2019-02-25 DIAGNOSIS — Z923 Personal history of irradiation: Secondary | ICD-10-CM | POA: Diagnosis not present

## 2019-02-28 ENCOUNTER — Ambulatory Visit: Payer: Medicaid Other | Attending: Radiation Oncology

## 2019-02-28 DIAGNOSIS — R1310 Dysphagia, unspecified: Secondary | ICD-10-CM

## 2019-02-28 DIAGNOSIS — I890 Lymphedema, not elsewhere classified: Secondary | ICD-10-CM | POA: Diagnosis present

## 2019-02-28 DIAGNOSIS — R471 Dysarthria and anarthria: Secondary | ICD-10-CM | POA: Diagnosis present

## 2019-02-28 DIAGNOSIS — R293 Abnormal posture: Secondary | ICD-10-CM | POA: Insufficient documentation

## 2019-02-28 NOTE — Patient Instructions (Signed)
I am recommending a modified barium swallow exam to Dr. Isidore Moos to check on your swallowing skills. You cleared your throat consistently after swallows of water today and I want to make sure you are not aspirating (food and liquids going into your lungs). Tucking your chin did not seem to reduce the amount of times you cleared your throat.
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
## 2019-03-01 ENCOUNTER — Other Ambulatory Visit: Payer: Self-pay | Admitting: Radiation Oncology

## 2019-03-01 DIAGNOSIS — C01 Malignant neoplasm of base of tongue: Secondary | ICD-10-CM

## 2019-03-02 ENCOUNTER — Telehealth: Payer: Self-pay | Admitting: *Deleted

## 2019-03-02 ENCOUNTER — Other Ambulatory Visit (HOSPITAL_COMMUNITY): Payer: Self-pay

## 2019-03-02 DIAGNOSIS — R1310 Dysphagia, unspecified: Secondary | ICD-10-CM

## 2019-03-02 NOTE — Telephone Encounter (Signed)
CALLED PATIENT TO INFORM OF MBS FOR 03-24-19 - ARRIVAL TIME - 12:45 PM @ [facility], NO RESTRICTIONS TO TEST, SPOKE WITH PATIENT AND HE IS AWARE OF THIS TEST

## 2019-03-03 ENCOUNTER — Other Ambulatory Visit: Payer: Self-pay

## 2019-03-03 ENCOUNTER — Ambulatory Visit: Payer: Medicaid Other | Admitting: Physical Therapy

## 2019-03-03 ENCOUNTER — Encounter: Payer: Self-pay | Admitting: Physical Therapy

## 2019-03-03 DIAGNOSIS — I890 Lymphedema, not elsewhere classified: Secondary | ICD-10-CM

## 2019-03-03 DIAGNOSIS — R1310 Dysphagia, unspecified: Secondary | ICD-10-CM | POA: Diagnosis not present
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
## 2019-03-07 MED FILL — ?ATORVASTATIN 40MG TABLET: 40 | 30 days supply | Qty: 30 | Fill #1

## 2019-03-07 MED FILL — ?OMEPRAZOLE 20 MG CAPSULE D: 20 | 30 days supply | Qty: 30 | Fill #2

## 2019-03-07 MED FILL — LIDOCAINE 2% VISCOUS SOLN: 2 | 5 days supply | Qty: 100 | Fill #3

## 2019-03-09 DIAGNOSIS — I890 Lymphedema, not elsewhere classified: Secondary | ICD-10-CM | POA: Diagnosis not present

## 2019-03-10 ENCOUNTER — Other Ambulatory Visit: Payer: Self-pay

## 2019-03-10 ENCOUNTER — Encounter: Payer: Self-pay | Admitting: Physical Therapy

## 2019-03-10 ENCOUNTER — Ambulatory Visit: Payer: Medicaid Other | Admitting: Physical Therapy

## 2019-03-10 DIAGNOSIS — R1310 Dysphagia, unspecified: Secondary | ICD-10-CM | POA: Diagnosis not present

## 2019-03-10 DIAGNOSIS — R293 Abnormal posture: Secondary | ICD-10-CM

## 2019-03-10 DIAGNOSIS — I890 Lymphedema, not elsewhere classified: Secondary | ICD-10-CM
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
## 2019-03-24 ENCOUNTER — Ambulatory Visit (HOSPITAL_COMMUNITY): Payer: MEDICAID

## 2019-03-24 ENCOUNTER — Encounter (HOSPITAL_COMMUNITY): Payer: Self-pay

## 2019-03-29 ENCOUNTER — Other Ambulatory Visit: Payer: Self-pay

## 2019-03-29 ENCOUNTER — Ambulatory Visit (INDEPENDENT_AMBULATORY_CARE_PROVIDER_SITE_OTHER): Payer: Medicaid Other | Admitting: Family Medicine

## 2019-03-29 DIAGNOSIS — Z09 Encounter for follow-up examination after completed treatment for conditions other than malignant neoplasm: Secondary | ICD-10-CM

## 2019-03-29 DIAGNOSIS — C140 Malignant neoplasm of pharynx, unspecified: Secondary | ICD-10-CM

## 2019-03-29 DIAGNOSIS — R070 Pain in throat: Secondary | ICD-10-CM

## 2019-03-29 DIAGNOSIS — E785 Hyperlipidemia, unspecified: Secondary | ICD-10-CM | POA: Diagnosis not present

## 2019-03-29 DIAGNOSIS — I10 Essential (primary) hypertension: Secondary | ICD-10-CM
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
## 2019-03-31 ENCOUNTER — Ambulatory Visit: Payer: Medicaid Other | Admitting: Physical Therapy

## 2019-04-04 ENCOUNTER — Ambulatory Visit: Payer: Medicaid Other

## 2019-04-05 ENCOUNTER — Ambulatory Visit: Payer: Medicaid Other | Admitting: Physical Therapy

## 2019-04-06 ENCOUNTER — Telehealth: Payer: Self-pay | Admitting: *Deleted

## 2019-04-06 ENCOUNTER — Telehealth: Payer: Self-pay

## 2019-04-06 NOTE — Telephone Encounter (Signed)
I spoke with Mr. Birdwell' mother today regarding his future CT scan of his neck and chest. They have both now been approved per Dr. Isidore Moos. They are now scheduled for 04/20/19 in [facility] radiology with a lab before at the [facility]. They will then have a telephone conversation with Dr. Isidore Moos on 04/22/19 to get results. She voiced her understanding of the above and have my direct number if they should have any further questions or concerns.

## 2019-04-06 NOTE — Telephone Encounter (Signed)
PATIENT WAS CALLED BY JENNIFER MALMFELT, NURSE FOR DR. SARAH SQUIRE AND INFORM OF LAB AND CT FOR 04-20-19 AND HIS FU ON 04-22-19 @ 2:20 PM WITH DR. Isidore Moos FOR RESULTS

## 2019-04-07 ENCOUNTER — Other Ambulatory Visit (HOSPITAL_COMMUNITY): Payer: Self-pay | Admitting: *Deleted

## 2019-04-07 ENCOUNTER — Encounter: Payer: Self-pay | Admitting: Physical Therapy

## 2019-04-07 DIAGNOSIS — R1310 Dysphagia, unspecified: Secondary | ICD-10-CM

## 2019-04-07 MED FILL — hydrOXYzine HCL 10 MG TABS: 10 | 30 days supply | Qty: 90 | Fill #1

## 2019-04-07 MED FILL — ?OMEPRAZOLE 20 MG CAPSULE D: 20 | 30 days supply | Qty: 30 | Fill #3

## 2019-04-07 MED FILL — ?ATORVASTATIN 40MG TABLET: 40 | 30 days supply | Qty: 30 | Fill #2

## 2019-04-08 ENCOUNTER — Ambulatory Visit: Payer: Self-pay | Admitting: Radiation Oncology

## 2019-04-13 ENCOUNTER — Telehealth: Payer: Self-pay | Admitting: *Deleted

## 2019-04-13 NOTE — Telephone Encounter (Signed)
CALLED PATIENT TO INFORM THAT LAB APPT. HAS BEEN MOVED TO 04-15-19 @ 9:45 AM @ [place], SPOKE WITH PATIENT'S MOM- BOBBI AND SHE IS AWARE OF THIS APPT. AND IS GOOD WITH THIS APPT.

## 2019-04-14 ENCOUNTER — Encounter: Payer: Self-pay | Admitting: Physical Therapy

## 2019-04-15 ENCOUNTER — Ambulatory Visit (HOSPITAL_COMMUNITY)
Admission: RE | Admit: 2019-04-15 | Discharge: 2019-04-15 | Disposition: A | Discharge status: Home / Self Care | Payer: Medicaid Other | Source: Ambulatory Visit | Attending: Radiation Oncology | Admitting: Radiation Oncology

## 2019-04-15 ENCOUNTER — Ambulatory Visit (HOSPITAL_COMMUNITY): Payer: No Typology Code available for payment source

## 2019-04-15 ENCOUNTER — Other Ambulatory Visit: Payer: Self-pay

## 2019-04-15 ENCOUNTER — Ambulatory Visit
Admission: RE | Admit: 2019-04-15 | Discharge: 2019-04-15 | Disposition: A | Discharge status: Home / Self Care | Payer: Medicaid Other | Source: Ambulatory Visit | Attending: Radiation Oncology | Admitting: Radiation Oncology

## 2019-04-15 ENCOUNTER — Encounter (HOSPITAL_COMMUNITY): Payer: Self-pay

## 2019-04-15 DIAGNOSIS — Z483 Aftercare following surgery for neoplasm: Secondary | ICD-10-CM | POA: Diagnosis not present

## 2019-04-15 DIAGNOSIS — C090 Malignant neoplasm of tonsillar fossa: Secondary | ICD-10-CM | POA: Insufficient documentation

## 2019-04-15 DIAGNOSIS — C760 Malignant neoplasm of head, face and neck: Secondary | ICD-10-CM | POA: Diagnosis not present

## 2019-04-15 DIAGNOSIS — Z923 Personal history of irradiation: Secondary | ICD-10-CM | POA: Diagnosis not present

## 2019-04-15 DIAGNOSIS — C01 Malignant neoplasm of base of tongue: Secondary | ICD-10-CM | POA: Insufficient documentation

## 2019-04-15 DIAGNOSIS — Z1329 Encounter for screening for other suspected endocrine disorder: Secondary | ICD-10-CM

## 2019-04-15 LAB — BUN & CREATININE (CHCC)
BUN: 15 mg/dL (ref 6–20)
Creatinine: 1.32 mg/dL — ABNORMAL HIGH (ref 0.61–1.24)
GFR, Est AFR Am: >60 mL/min (ref >60)
GFR, Estimated: >60 mL/min (ref >60)

## 2019-04-15 LAB — TSH: TSH: 1.645 u[IU]/mL (ref 0.320–4.118)

## 2019-04-15 MED ORDER — IOHEXOL 300 MG/ML  SOLN
75.0000 mL | Freq: Once | INTRAMUSCULAR | Status: AC | PRN
  Administered 2019-04-15: 75 mL via INTRAVENOUS

## 2019-04-15 MED ORDER — SODIUM CHLORIDE (PF) 0.9 % IJ SOLN
INTRAMUSCULAR | Status: AC
  Filled 2019-04-15: qty 50

## 2019-04-19 ENCOUNTER — Ambulatory Visit (HOSPITAL_COMMUNITY): Payer: No Typology Code available for payment source

## 2019-04-19 ENCOUNTER — Encounter (HOSPITAL_COMMUNITY): Payer: Self-pay

## 2019-04-20 ENCOUNTER — Other Ambulatory Visit (HOSPITAL_COMMUNITY): Payer: Self-pay

## 2019-04-20 ENCOUNTER — Ambulatory Visit: Payer: Medicaid Other

## 2019-04-20 ENCOUNTER — Encounter (HOSPITAL_COMMUNITY): Payer: Self-pay

## 2019-04-21 ENCOUNTER — Ambulatory Visit: Payer: Medicaid Other | Admitting: Physical Therapy

## 2019-04-22 ENCOUNTER — Encounter: Payer: Self-pay | Admitting: Radiation Oncology

## 2019-04-22 ENCOUNTER — Ambulatory Visit
Admission: RE | Admit: 2019-04-22 | Discharge: 2019-04-22 | Disposition: A | Discharge status: Home / Self Care | Payer: No Typology Code available for payment source | Source: Ambulatory Visit | Attending: Radiation Oncology | Admitting: Radiation Oncology

## 2019-04-22 ENCOUNTER — Encounter: Payer: Self-pay | Admitting: *Deleted

## 2019-04-22 DIAGNOSIS — C090 Malignant neoplasm of tonsillar fossa: Secondary | ICD-10-CM

## 2019-04-22 NOTE — Progress Notes (Addendum)
Radiation Oncology         [phone] ________________________________  Name: Brian Dixon MRN: [id number]  Date: 04/22/2019  DOB: [birth year]  Follow-Up Telephone Note  CC: Brian Glatter, FNP  Brian Quitter, MD  Diagnosis and Prior Radiotherapy:        ICD-10-CM   1. 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.0     Cancer Staging 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[place]) Staging form: Pharynx - P16 Negative Oropharynx, AJCC 8th Edition - Clinical stage from 09/07/2018: Stage III (cT3, cN0, cM0, p16-) - Signed by Brian Gibson, MD on 09/07/2018 - Pathologic: Stage III (pT3, pN0, cM0, p16-) - Signed by Brian Gibson, MD on 11/05/2018  Radiation treatment dates:   11/29/2018-01/12/2019  Site/dose:     Oropharynx and tongue / total dose, 60Gy in 30 fractions  CHIEF COMPLAINT:  Here for follow-up and surveillance of oropharynx and tongue cancer  Narrative:  The patient and I connected by phone today as he did not have the resources to accomplish a WebEx meeting during this pandemic.  Brian Dixon and his parents joined the call.  He underwent follow up chest and neck CT scans on 04/15/2019. Chest CT showed no evidence of metastatic disease, and the neck CT showed no evidence of recurrent tumor. I have reviewed his images.                 Pain issues, if any: none Using a feeding tube?: occasionally for medication only Weight changes, if any:  Wt Readings from Last 3 Encounters:  01/28/19 179 lb 6 oz (81.4 kg)  01/11/19 175 lb 3.2 oz (79.5 kg)  01/04/19 175 lb 12.8 oz (79.7 kg)   Swallowing issues, if any: functional Smoking or chewing tobacco? 10 cigs daily Other notable issues, if any: regaining taste, feels well   ALLERGIES:  is allergic to shellfish allergy.  Meds: Current Outpatient Medications  Medication Sig Dispense Refill  . aspirin 325 MG tablet Take 1 tablet (325 mg total) by mouth daily. 30 tablet 2  . atorvastatin (LIPITOR) 40 MG tablet Take 1  tablet (40 mg total) by mouth daily at 6 PM. 30 tablet 2  . cyanocobalamin 1000 MCG tablet Take 1 tablet (1,000 mcg total) by mouth daily. 30 tablet 2  . hydrOXYzine (ATARAX/VISTARIL) 10 MG tablet Take 1 tablet (10 mg total) by mouth 3 (three) times daily as needed. 90 tablet 3  . lidocaine (XYLOCAINE) 2 % solution Patient: Mix 1part 2% viscous lidocaine, 1part H20. Swish and spit 45mL of diluted mixture, up to every 2 hours PRN mouth pain. 200 mL 4  . magic mouthwash w/lidocaine SOLN Take 10 mLs by mouth.    . Nutritional Supplements (FEEDING SUPPLEMENT, OSMOLITE 1.5 CAL,) LIQD Change Peptamen 1.5 to Osmolite 1.5 via G-tube. Give 1.5 bottles QID with 60 mL free water flush before and after bolus.Give 240 mL free water TID between TF. (Patient not taking: Reported on 03/29/2019) 6 Bottle 0  . omeprazole (PRILOSEC) 20 MG capsule Take 1 capsule (20 mg total) by mouth daily. 30 capsule 5  . sucralfate (CARAFATE) 1 g tablet Dissolve 1 tablet in 10 mL H20 and swallow up to QID prn sore throat. 40 tablet 5   No current facility-administered medications for this encounter.     Physical Findings:   Wt Readings from Last 3 Encounters:  01/28/19 179 lb 6 oz (81.4 kg)  01/11/19 175 lb 3.2 oz (79.5 kg)  01/04/19 175 lb 12.8 oz (79.7  kg)    vitals were not taken for this visit. Marland Kitchen   He states weight at home is 180-185lb  Lab Findings: Lab Results  Component Value Date   WBC 8.0 08/24/2018   HGB 14.2 08/24/2018   HCT 42.2 08/24/2018   MCV 102 (H) 08/24/2018   PLT 291 08/24/2018    Lab Results  Component Value Date   TSH 1.645 04/15/2019    Radiographic Findings: Ct Soft Tissue Neck W Contrast  Result Date: 04/15/2019 CLINICAL DATA:  Squamous cell carcinoma base of tongue. Postop surgical resection and radiation therapy. EXAM: CT NECK WITH CONTRAST TECHNIQUE: Multidetector CT imaging of the neck was performed using the standard protocol following the bolus administration of intravenous  contrast. CONTRAST:  12mL OMNIPAQUE IOHEXOL 300 MG/ML  SOLN COMPARISON:  PET-CT 09/03/2018, CT neck 08/05/2018 FINDINGS: Pharynx and larynx: Postop hemiglossectomy on the right. Postop soft tissue and fatty graft in the right tongue extending into the right lateral parapharyngeal soft tissues. Numerous surgical clips in the right tongue and right level 2 lymph node region. No recurrent mass in the tongue. Epiglottis and larynx normal. Salivary glands: Resection of submandibular glands bilaterally. Parotid normal bilaterally. Thyroid: Negative Lymph nodes: Bilateral neck dissection with numerous surgical clips bilaterally. No enlarged or pathologic nodes in the neck. Vascular: Normal arterial and venous enhancement. Both jugular veins patent. Limited intracranial: Negative Visualized orbits: Negative Mastoids and visualized paranasal sinuses: Mild mucosal edema paranasal sinuses. Mastoid clear bilaterally. Skeleton: Cervical spondylosis. Multilevel foraminal stenosis and spinal stenosis due to extensive spurring. No destructive bone lesion. Upper chest: Lung apices clear bilaterally Other: Subcutaneous edema in the anterior neck and chin bilaterally compatible with radiation change. IMPRESSION: Postop right hemiglossectomy. Postop bilateral neck dissection. No evidence of recurrent tumor. This will serve as a baseline study post resection and radiation of base of tongue tumor on the right. Electronically Signed   By: Brian Dixon M.D.   On: 04/15/2019 11:58   Ct Chest W Contrast  Result Date: 04/15/2019 CLINICAL DATA:  Head and neck cancer. EXAM: CT CHEST WITH CONTRAST TECHNIQUE: Multidetector CT imaging of the chest was performed during intravenous contrast administration. CONTRAST:  50mL OMNIPAQUE IOHEXOL 300 MG/ML  SOLN COMPARISON:  PET-CT 09/03/2018. Chest CT 08/06/2018. FINDINGS: Cardiovascular: The heart size is normal. No substantial pericardial effusion. Coronary artery calcification is evident.  Mediastinum/Nodes: Small mediastinal lymph nodes are stable. No mediastinal lymphadenopathy. There is no hilar lymphadenopathy. The esophagus has normal imaging features. There is no axillary lymphadenopathy. Lungs/Pleura: The central tracheobronchial airways are patent. No suspicious pulmonary nodule or mass. No focal airspace consolidation. No pleural effusion. Upper Abdomen: Gastrostomy tube noted in situ. Musculoskeletal: No worrisome lytic or sclerotic osseous abnormality. IMPRESSION: Stable exam. No new or progressive findings to suggest metastatic disease. Electronically Signed   By: Misty Stanley M.D.   On: 04/15/2019 13:57    Impression/Plan:    1) Head and Neck Cancer Status: in remission per scans - NED  2) Nutritional Status: good per report PEG tube: discuss removal at [facility] where it was placed; pt taking all nutrition by mouth; he will crush and swallow pills with H2O PRN  3) Risk Factors: The patient has been educated about risk factors including alcohol and tobacco abuse; they understand that avoidance of alcohol and tobacco is important to prevent recurrences as well as other cancers. Unfortunately, he is still smoking.  Urged again to cut back.  4) Swallowing: able to swallow soft foods.   5) Dental:  Edentulous.   6) Thyroid function: WNL Lab Results  Component Value Date   TSH 1.645 04/15/2019    7) Other: continue massage for neck lymphedema   F/u w/ ENT in end of May, with me end of August   I left a message with Dr Milana Na secretary re: CT results.    This encounter was provided by phone as he couldn't access WebEx. The time spent during this encounter was 15 minutes. The attendants for this meeting include Brian Dixon  and YUREM VINER.  During the encounter, Brian Dixon was located at [facility] Radiation Oncology Department.  Erline Hau was located at home.   _____________________________________   Brian Gibson, MD   This document serves as a record of services personally performed by Brian Gibson, MD. It was created on her behalf by Wilburn Mylar, a trained medical scribe. The creation of this record is based on the scribe's personal observations and the provider's statements to them. This document has been checked and approved by the attending provider.

## 2019-04-23 NOTE — Progress Notes (Signed)
Oncology Nurse Navigator Documentation  Joined Dr. Isidore Moos for post-tmt follow-up conference call with Brian Dixon and his mother.  He completed adjuvant RT for oropharynx and tongue cancer 01/12/19. They voiced understanding of unremarkable findings in 4/17 CT Chest and CT Neck. He reported:  Denied post-tmt issues/conerns.  Seeing Dr. Hendricks Limes late May.  Return of taste sensation.  Smoking about 10 cigs/day.  He was encouraged by Dr. Isidore Moos to quit in order to maximize his prognosis.  He voiced understanding.  Not using PEG, 100% oral intake.  He was encouraged to discuss removal when he sees Dr. Hendricks Limes in May.  Maintaining weight at 180-185 lbs. I encouraged him to call me with needs/concerns before RTC in August to see Dr. Isidore Moos.  [name], RN, BSN Head & Neck Oncology Nurse [place] [facility] [place] [phone]

## 2019-05-09 ENCOUNTER — Other Ambulatory Visit: Payer: Self-pay

## 2019-05-09 ENCOUNTER — Ambulatory Visit: Payer: Medicaid Other | Attending: Radiation Oncology

## 2019-05-09 DIAGNOSIS — M25611 Stiffness of right shoulder, not elsewhere classified: Secondary | ICD-10-CM | POA: Diagnosis present

## 2019-05-09 DIAGNOSIS — I890 Lymphedema, not elsewhere classified: Secondary | ICD-10-CM | POA: Diagnosis present

## 2019-05-09 DIAGNOSIS — M25612 Stiffness of left shoulder, not elsewhere classified: Secondary | ICD-10-CM

## 2019-05-09 DIAGNOSIS — L599 Disorder of the skin and subcutaneous tissue related to radiation, unspecified: Secondary | ICD-10-CM

## 2019-05-09 DIAGNOSIS — R293 Abnormal posture: Secondary | ICD-10-CM | POA: Diagnosis present

## 2019-05-09 NOTE — Patient Instructions (Signed)
Over Head Pull: Narrow and Wide Grip   Cancer Rehab [phone]   On back, knees bent, feet flat, band across thighs, elbows straight but relaxed. Pull hands apart (start). Keeping elbows straight, bring arms up and over head, hands toward floor. Keep pull steady on band. Hold momentarily. Return slowly, keeping pull steady, back to start. Then do same with a wider grip on the band (past shoulder width) Repeat _5-10__ times. Band color __blue____   Side Pull: Double Arm   On back, knees bent, feet flat. Arms perpendicular to body, shoulder level, elbows straight but relaxed. Pull arms out to sides, elbows straight. Resistance band comes across collarbones, hands toward floor. Hold momentarily. Slowly return to starting position. Repeat _5-10__ times. Band color _blue____   Sword   On back, knees bent, feet flat, left hand on left hip, right hand above left. Pull right arm DIAGONALLY (hip to shoulder) across chest. Bring right arm along head toward floor. Hold momentarily. Slowly return to starting position. Repeat _5-10__ times. Do with left arm. Band color _blue_____   Shoulder Rotation: Double Arm   On back, knees bent, feet flat, elbows tucked at sides, bent 90, hands palms up. Pull hands apart and down toward floor, keeping elbows near sides. Hold momentarily. Slowly return to starting position. Repeat _5-10__ times. Band color __blue____   3 Way Raises:      Starting Position:  Leaning against wall, walk feet a few inches away from the wall and make tummy tight (tuck hips underneath you) Press back/shoulders/head against wall as much as possible. Keep thumbs up to ceiling, elbows straight and shoulders relaxed/down throughout.  1. Lift arms in front to shoulder height 2. Lift arms a little wider into a "V" to shoulder height 3. Lift arms out to sides in a "T" to shoulder height  Perform 10 times in each direction. Hold 1-2 lbs to start with and work up to 2-3 sets of 10/day.  Perform 3-4 times/week. Increase weight as able, decreasing sets of 10 each time you increase weights, then slowly working your way back up to 2-3 sets each time.
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
## 2019-05-16 ENCOUNTER — Other Ambulatory Visit: Payer: Self-pay | Admitting: Family Medicine

## 2019-05-16 DIAGNOSIS — E785 Hyperlipidemia, unspecified: Secondary | ICD-10-CM

## 2019-05-16 DIAGNOSIS — Z8673 Personal history of transient ischemic attack (TIA), and cerebral infarction without residual deficits: Secondary | ICD-10-CM

## 2019-05-18 ENCOUNTER — Other Ambulatory Visit: Payer: Self-pay

## 2019-05-18 DIAGNOSIS — E785 Hyperlipidemia, unspecified: Secondary | ICD-10-CM

## 2019-05-18 DIAGNOSIS — Z8673 Personal history of transient ischemic attack (TIA), and cerebral infarction without residual deficits: Secondary | ICD-10-CM

## 2019-05-18 MED ORDER — ATORVASTATIN CALCIUM 40 MG PO TABS: 40.0000 mg | ORAL_TABLET | Freq: Every day | ORAL | 2 refills | Status: DC

## 2019-05-18 NOTE — Progress Notes (Signed)
Medication has been sent to pharmacy.  °

## 2019-05-24 ENCOUNTER — Other Ambulatory Visit: Payer: Self-pay

## 2019-05-24 DIAGNOSIS — E785 Hyperlipidemia, unspecified: Secondary | ICD-10-CM

## 2019-05-24 DIAGNOSIS — Z8673 Personal history of transient ischemic attack (TIA), and cerebral infarction without residual deficits: Secondary | ICD-10-CM

## 2019-05-24 MED ORDER — ATORVASTATIN CALCIUM 40 MG PO TABS: 40.0000 mg | ORAL_TABLET | Freq: Every day | ORAL | 2 refills | Status: DC

## 2019-05-24 MED FILL — ?ATORVASTATIN 40MG TABLET: 40 | 30 days supply | Qty: 30 | Fill #0

## 2019-05-24 NOTE — Telephone Encounter (Signed)
Medication sent to pharmacy  

## 2019-05-31 ENCOUNTER — Ambulatory Visit (HOSPITAL_COMMUNITY)
Admission: RE | Admit: 2019-05-31 | Discharge: 2019-05-31 | Disposition: A | Discharge status: Home / Self Care | Payer: No Typology Code available for payment source | Source: Ambulatory Visit | Attending: Radiation Oncology | Admitting: Radiation Oncology

## 2019-05-31 ENCOUNTER — Ambulatory Visit (HOSPITAL_COMMUNITY): Payer: Self-pay

## 2019-05-31 DIAGNOSIS — C01 Malignant neoplasm of base of tongue: Secondary | ICD-10-CM

## 2019-06-03 MED FILL — ?OMEPRAZOLE 20 MG CAPSULE D: 20 | 30 days supply | Qty: 30 | Fill #4

## 2019-06-27 ENCOUNTER — Telehealth: Payer: Self-pay

## 2019-06-27 DIAGNOSIS — I10 Essential (primary) hypertension: Secondary | ICD-10-CM | POA: Diagnosis not present

## 2019-06-27 DIAGNOSIS — Z08 Encounter for follow-up examination after completed treatment for malignant neoplasm: Secondary | ICD-10-CM | POA: Diagnosis not present

## 2019-06-27 DIAGNOSIS — Z923 Personal history of irradiation: Secondary | ICD-10-CM | POA: Diagnosis not present

## 2019-06-27 DIAGNOSIS — Z87891 Personal history of nicotine dependence: Secondary | ICD-10-CM | POA: Diagnosis not present

## 2019-06-27 DIAGNOSIS — C109 Malignant neoplasm of oropharynx, unspecified: Secondary | ICD-10-CM | POA: Diagnosis not present

## 2019-06-27 DIAGNOSIS — Z9009 Acquired absence of other part of head and neck: Secondary | ICD-10-CM | POA: Diagnosis not present

## 2019-06-27 DIAGNOSIS — Z85810 Personal history of malignant neoplasm of tongue: Secondary | ICD-10-CM | POA: Diagnosis not present

## 2019-06-27 NOTE — Telephone Encounter (Signed)
Patient mom called and states that patient blood pressure was 199/121 and patient was advise to go to ED as soon as possible

## 2019-06-28 ENCOUNTER — Ambulatory Visit (INDEPENDENT_AMBULATORY_CARE_PROVIDER_SITE_OTHER): Payer: Medicaid Other | Admitting: Family Medicine

## 2019-06-28 ENCOUNTER — Encounter: Payer: Self-pay | Admitting: Family Medicine

## 2019-06-28 ENCOUNTER — Other Ambulatory Visit: Payer: Self-pay

## 2019-06-28 VITALS — BP 180/104 | HR 70 | Temp 98.2°F | Ht 69.0 in | Wt 165.0 lb

## 2019-06-28 DIAGNOSIS — I160 Hypertensive urgency: Secondary | ICD-10-CM | POA: Diagnosis not present

## 2019-06-28 DIAGNOSIS — I10 Essential (primary) hypertension: Secondary | ICD-10-CM | POA: Diagnosis not present

## 2019-06-28 DIAGNOSIS — Z09 Encounter for follow-up examination after completed treatment for conditions other than malignant neoplasm: Secondary | ICD-10-CM

## 2019-06-28 DIAGNOSIS — C090 Malignant neoplasm of tonsillar fossa: Secondary | ICD-10-CM

## 2019-06-28 MED ORDER — AMLODIPINE BESYLATE 5 MG PO TABS: 5.0000 mg | ORAL_TABLET | Freq: Every day | ORAL | 1 refills | Status: DC

## 2019-06-28 MED ORDER — LISINOPRIL 10 MG PO TABS: 10.0000 mg | ORAL_TABLET | Freq: Every day | ORAL | 1 refills | Status: DC

## 2019-06-28 MED ORDER — CLONIDINE HCL 0.1 MG PO TABS
0.2000 mg | ORAL_TABLET | Freq: Once | ORAL | Status: AC
  Administered 2019-06-28: 0.2 mg via ORAL

## 2019-06-28 MED ORDER — CLONIDINE HCL 0.1 MG PO TABS
0.1000 mg | ORAL_TABLET | Freq: Once | ORAL | Status: AC
  Administered 2019-06-28: 0.1 mg via ORAL

## 2019-06-28 NOTE — Patient Instructions (Signed)
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
## 2019-07-04 ENCOUNTER — Ambulatory Visit: Payer: Self-pay | Admitting: Family Medicine

## 2019-07-26 ENCOUNTER — Telehealth: Payer: Self-pay | Admitting: Family Medicine

## 2019-07-27 ENCOUNTER — Other Ambulatory Visit: Payer: Self-pay

## 2019-07-27 DIAGNOSIS — K219 Gastro-esophageal reflux disease without esophagitis: Secondary | ICD-10-CM

## 2019-07-27 MED ORDER — OMEPRAZOLE 20 MG PO CPDR: 20.0000 mg | DELAYED_RELEASE_CAPSULE | Freq: Every day | ORAL | 2 refills | Status: DC

## 2019-07-27 NOTE — Telephone Encounter (Signed)
Sent to NP 

## 2019-07-27 NOTE — Telephone Encounter (Signed)
Medication sent to pharmacy. Tried to contact patient no answer

## 2019-07-30 DIAGNOSIS — G4700 Insomnia, unspecified: Secondary | ICD-10-CM

## 2019-07-30 HISTORY — DX: Insomnia, unspecified: G47.00

## 2019-08-11 ENCOUNTER — Other Ambulatory Visit: Payer: Self-pay | Admitting: Family Medicine

## 2019-08-11 ENCOUNTER — Encounter: Payer: Self-pay | Admitting: Family Medicine

## 2019-08-11 ENCOUNTER — Other Ambulatory Visit: Payer: Self-pay

## 2019-08-11 ENCOUNTER — Telehealth: Payer: Self-pay

## 2019-08-11 DIAGNOSIS — G4700 Insomnia, unspecified: Secondary | ICD-10-CM

## 2019-08-11 MED ORDER — TRAZODONE HCL 50 MG PO TABS: 25.0000 mg | ORAL_TABLET | Freq: Every evening | ORAL | 3 refills | Status: DC | PRN

## 2019-08-11 NOTE — Telephone Encounter (Signed)
Patient notified

## 2019-08-11 NOTE — Telephone Encounter (Signed)
Patient is having some insomnia and would like to have something in for this to [facility]. Patient is aware that you are out of office

## 2019-08-23 NOTE — Progress Notes (Signed)
Mr. Sepp presents for follow up of radiation completed 01/12/19 to his oropharynx and tongue.   Pain issues, if any: He denies.  Using a feeding tube?: N/A removed 05/2019 Weight changes, if any:  Wt Readings from Last 3 Encounters:  08/26/19 161 lb 6.4 oz (73.2 kg)  06/28/19 165 lb (74.8 kg)  01/28/19 179 lb 6 oz (81.4 kg)   Swallowing issues, if any: He has difficulty swallowing bread. He is unable to eat sandwiches. He is unable to eat fruits such as watermelon or cantalope whole.  Smoking or chewing tobacco?  He tells me that he smokes about 1 pack of cigarettes daily.  Using fluoride trays daily? No Last ENT visit was on: Dr. Vertell Limber 06/27/19 Other notable issues, if any:   BP (!) 146/90   Pulse (!) 102   Temp 98.3 F (36.8 C) (Tympanic)   Wt 161 lb 6.4 oz (73.2 kg)   SpO2 99% Comment: room air  BMI 23.83 kg/m

## 2019-08-26 ENCOUNTER — Other Ambulatory Visit: Payer: Self-pay

## 2019-08-26 ENCOUNTER — Ambulatory Visit
Admission: RE | Admit: 2019-08-26 | Discharge: 2019-08-26 | Disposition: A | Discharge status: Home / Self Care | Payer: Medicaid Other | Source: Ambulatory Visit | Attending: Radiation Oncology | Admitting: Radiation Oncology

## 2019-08-26 ENCOUNTER — Encounter: Payer: Self-pay | Admitting: Radiation Oncology

## 2019-08-26 VITALS — BP 146/90 | HR 102 | Temp 98.3°F | Wt 161.4 lb

## 2019-08-26 DIAGNOSIS — C090 Malignant neoplasm of tonsillar fossa: Secondary | ICD-10-CM

## 2019-08-26 DIAGNOSIS — R7989 Other specified abnormal findings of blood chemistry: Secondary | ICD-10-CM | POA: Insufficient documentation

## 2019-08-26 DIAGNOSIS — Z79899 Other long term (current) drug therapy: Secondary | ICD-10-CM | POA: Diagnosis not present

## 2019-08-26 DIAGNOSIS — Z923 Personal history of irradiation: Secondary | ICD-10-CM | POA: Diagnosis not present

## 2019-08-26 DIAGNOSIS — Z85819 Personal history of malignant neoplasm of unspecified site of lip, oral cavity, and pharynx: Secondary | ICD-10-CM | POA: Insufficient documentation

## 2019-08-26 DIAGNOSIS — Z1329 Encounter for screening for other suspected endocrine disorder: Secondary | ICD-10-CM

## 2019-08-26 DIAGNOSIS — Z08 Encounter for follow-up examination after completed treatment for malignant neoplasm: Secondary | ICD-10-CM | POA: Diagnosis not present

## 2019-08-26 NOTE — Progress Notes (Signed)
Radiation Oncology         [phone] ________________________________  Name: Brian Dixon MRN: [id number]  Date: 08/26/2019  DOB: Apr 04, 1966  Follow-Up in person Note Outpatient  CC: Azzie Glatter, FNP  Melida Quitter, MD  Diagnosis and Prior Radiotherapy:        ICD-10-CM   1. Screening for hypothyroidism  Z13.29 TSH  2. 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HCC)  C09.0 TSH  3. Elevated TSH  R79.89 TSH    Cancer Staging 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HCC) Staging form: Pharynx - P16 Negative Oropharynx, AJCC 8th Edition - Clinical stage from 09/07/2018: Stage III (cT3, cN0, cM0, p16-) - Signed by Eppie Gibson, MD on 09/07/2018 - Pathologic: Stage III (pT3, pN0, cM0, p16-) - Signed by Eppie Gibson, MD on 11/05/2018  Radiation treatment dates:   11/29/2018-01/12/2019  Site/dose:     Oropharynx and tongue / total dose, 60Gy in 30 fractions  CHIEF COMPLAINT:  Here for follow-up and surveillance of oropharynx and tongue cancer  Narrative:  He returns today for routine follow up. He last saw Dr. Vertell Limber on 06/27/2019, who noted no evidence of disease on exam.                 Pain issues, if any: none Using a feeding tube?: removed by Dr. Vertell Limber on 06/27/2019 Weight changes, if any:  Wt Readings from Last 3 Encounters:  08/26/19 161 lb 6.4 oz (73.2 kg)  06/28/19 165 lb (74.8 kg)  01/28/19 179 lb 6 oz (81.4 kg)   Swallowing issues, if any: He has difficulty swallowing bread. He is unable to eat sandwiches. He is unable to eat fruits such as watermelon or cantalope whole. Smoking or chewing tobacco? 1 pack daily Other notable issues, if any: he needs a note to return to work.  He feels he has the strength and stamina to do this.  ALLERGIES:  is allergic to shellfish allergy.  Meds: Current Outpatient Medications  Medication Sig Dispense Refill   acetaminophen (TYLENOL) 325 MG tablet Take 650 mg by mouth every 6 (six) hours as needed.     amLODipine (NORVASC) 5  MG tablet Take 5 mg by mouth daily.     atorvastatin (LIPITOR) 40 MG tablet Take 1 tablet (40 mg total) by mouth daily at 6 PM. 30 tablet 2   cyanocobalamin 1000 MCG tablet Take 1 tablet (1,000 mcg total) by mouth daily. 30 tablet 2   hydrOXYzine (ATARAX/VISTARIL) 10 MG tablet Take 1 tablet (10 mg total) by mouth 3 (three) times daily as needed. 90 tablet 3   lisinopril (ZESTRIL) 10 MG tablet Take 1 tablet (10 mg total) by mouth daily. 90 tablet 1   magic mouthwash w/lidocaine SOLN Take 10 mLs by mouth.     omeprazole (PRILOSEC) 20 MG capsule Take 1 capsule (20 mg total) by mouth daily. 30 capsule 2   traZODone (DESYREL) 50 MG tablet Take 0.5-1 tablets (25-50 mg total) by mouth at bedtime as needed for sleep. 30 tablet 3   aspirin 325 MG tablet Take 1 tablet (325 mg total) by mouth daily. (Patient not taking: Reported on 06/28/2019) 30 tablet 2   lidocaine (XYLOCAINE) 2 % solution Patient: Mix 1part 2% viscous lidocaine, 1part H20. Swish and spit 8mL of diluted mixture, up to every 2 hours PRN mouth pain. (Patient not taking: Reported on 08/26/2019) 200 mL 4   Nutritional Supplements (FEEDING SUPPLEMENT, OSMOLITE 1.5 CAL,) LIQD Change Peptamen 1.5 to Osmolite 1.5 via G-tube. Give  1.5 bottles QID with 60 mL free water flush before and after bolus.Give 240 mL free water TID between TF. (Patient not taking: Reported on 03/29/2019) 6 Bottle 0   sucralfate (CARAFATE) 1 g tablet Dissolve 1 tablet in 10 mL H20 and swallow up to QID prn sore throat. (Patient not taking: Reported on 08/26/2019) 40 tablet 5   No current facility-administered medications for this encounter.     Physical Findings:   Wt Readings from Last 3 Encounters:  08/26/19 161 lb 6.4 oz (73.2 kg)  06/28/19 165 lb (74.8 kg)  01/28/19 179 lb 6 oz (81.4 kg)    weight is 161 lb 6.4 oz (73.2 kg). His tympanic temperature is 98.3 F (36.8 C). His blood pressure is 146/90 (abnormal) and his pulse is 102 (abnormal). His oxygen  saturation is 99%. .  General: Alert and oriented, in no acute distress HEENT: Head is normocephalic. Extraocular movements are intact. Oropharynx is clear. Neck: Neck is supple, no palpable cervical or supraclavicular lymphadenopathy. Lymphatics: see Neck Exam Skin: No concerning lesions. Neurologic: Cranial nerves II through XII are grossly intact. No obvious focalities. Speech is fluent. Coordination is intact. Psychiatric: Judgment and insight are intact. Affect is appropriate.   Lab Findings: Lab Results  Component Value Date   WBC 8.0 08/24/2018   HGB 14.2 08/24/2018   HCT 42.2 08/24/2018   MCV 102 (H) 08/24/2018   PLT 291 08/24/2018    Lab Results  Component Value Date   TSH 1.645 04/15/2019    Radiographic Findings: No results found.  Impression/Plan:    1) Head and Neck Cancer Status: in remission, NED  2) Nutritional Status: no issues PEG tube: removed 05/2019  3) Risk Factors: The patient has been educated about risk factors including alcohol and tobacco abuse; they understand that avoidance of alcohol and tobacco is important to prevent recurrences as well as other cancers. Unfortunately, he is still smoking.  Urged again to cut back.  4) Swallowing: able to swallow soft foods. Continue SLP exercises  5) Dental: Edentulous.   6) Thyroid function: WNL Lab Results  Component Value Date   TSH 1.645 04/15/2019    7) Other: F/u w/ ENT as scheduled, with me in 74mo with TSH; sooner if needed.  Will write letter per patient's request to return to work.   _____________________________________   Eppie Gibson, MD  This document serves as a record of services personally performed by Eppie Gibson, MD. It was created on her behalf by Wilburn Mylar, a trained medical scribe. The creation of this record is based on the scribe's personal observations and the provider's statements to them. This document has been checked and approved by the attending provider.

## 2019-08-29 ENCOUNTER — Telehealth: Payer: Self-pay | Admitting: *Deleted

## 2019-08-29 NOTE — Telephone Encounter (Signed)
CALLED PATIENT TO INFORM OF LAB AND FU ON 02/24/20, SPOKE WITH PATIENT'S MOTHER AND SHE IS AWARE OF THESE APPTS.

## 2019-09-02 ENCOUNTER — Other Ambulatory Visit: Payer: Self-pay | Admitting: Family Medicine

## 2019-09-02 DIAGNOSIS — G4700 Insomnia, unspecified: Secondary | ICD-10-CM

## 2019-09-06 ENCOUNTER — Telehealth: Payer: Self-pay

## 2019-09-06 ENCOUNTER — Other Ambulatory Visit: Payer: Self-pay

## 2019-09-06 DIAGNOSIS — Z8673 Personal history of transient ischemic attack (TIA), and cerebral infarction without residual deficits: Secondary | ICD-10-CM

## 2019-09-06 DIAGNOSIS — E785 Hyperlipidemia, unspecified: Secondary | ICD-10-CM

## 2019-09-06 MED ORDER — ATORVASTATIN CALCIUM 40 MG PO TABS: 40.0000 mg | ORAL_TABLET | Freq: Every day | ORAL | 1 refills | Status: DC

## 2019-09-06 NOTE — Telephone Encounter (Signed)
Sent in refill to pt pharmacy.

## 2019-09-14 ENCOUNTER — Ambulatory Visit: Payer: Self-pay | Admitting: Family Medicine

## 2019-09-23 ENCOUNTER — Other Ambulatory Visit: Payer: Self-pay | Admitting: Family Medicine

## 2019-09-23 DIAGNOSIS — K219 Gastro-esophageal reflux disease without esophagitis: Secondary | ICD-10-CM

## 2019-09-29 ENCOUNTER — Other Ambulatory Visit: Payer: Self-pay | Admitting: Family Medicine

## 2019-09-29 DIAGNOSIS — Z8673 Personal history of transient ischemic attack (TIA), and cerebral infarction without residual deficits: Secondary | ICD-10-CM

## 2019-09-29 DIAGNOSIS — K529 Noninfective gastroenteritis and colitis, unspecified: Secondary | ICD-10-CM

## 2019-09-29 DIAGNOSIS — E785 Hyperlipidemia, unspecified: Secondary | ICD-10-CM

## 2019-09-29 HISTORY — DX: Noninfective gastroenteritis and colitis, unspecified: K52.9

## 2019-10-23 ENCOUNTER — Emergency Department (HOSPITAL_COMMUNITY)
Admission: EM | Admit: 2019-10-23 | Discharge: 2019-10-23 | Disposition: A | Discharge status: Home / Self Care | Payer: Medicaid Other | Attending: Emergency Medicine | Admitting: Emergency Medicine

## 2019-10-23 ENCOUNTER — Other Ambulatory Visit: Payer: Self-pay

## 2019-10-23 ENCOUNTER — Emergency Department (HOSPITAL_COMMUNITY): Payer: Medicaid Other

## 2019-10-23 ENCOUNTER — Encounter (HOSPITAL_COMMUNITY): Payer: Self-pay

## 2019-10-23 DIAGNOSIS — I10 Essential (primary) hypertension: Secondary | ICD-10-CM | POA: Insufficient documentation

## 2019-10-23 DIAGNOSIS — R29898 Other symptoms and signs involving the musculoskeletal system: Secondary | ICD-10-CM | POA: Diagnosis not present

## 2019-10-23 DIAGNOSIS — M25561 Pain in right knee: Secondary | ICD-10-CM | POA: Diagnosis present

## 2019-10-23 DIAGNOSIS — N200 Calculus of kidney: Secondary | ICD-10-CM | POA: Diagnosis not present

## 2019-10-23 DIAGNOSIS — K529 Noninfective gastroenteritis and colitis, unspecified: Secondary | ICD-10-CM | POA: Insufficient documentation

## 2019-10-23 DIAGNOSIS — F17210 Nicotine dependence, cigarettes, uncomplicated: Secondary | ICD-10-CM | POA: Diagnosis not present

## 2019-10-23 DIAGNOSIS — G629 Polyneuropathy, unspecified: Secondary | ICD-10-CM | POA: Diagnosis not present

## 2019-10-23 DIAGNOSIS — Z79899 Other long term (current) drug therapy: Secondary | ICD-10-CM | POA: Insufficient documentation

## 2019-10-23 DIAGNOSIS — Z8673 Personal history of transient ischemic attack (TIA), and cerebral infarction without residual deficits: Secondary | ICD-10-CM | POA: Insufficient documentation

## 2019-10-23 DIAGNOSIS — M629 Disorder of muscle, unspecified: Secondary | ICD-10-CM | POA: Diagnosis not present

## 2019-10-23 DIAGNOSIS — K8020 Calculus of gallbladder without cholecystitis without obstruction: Secondary | ICD-10-CM | POA: Diagnosis not present

## 2019-10-23 DIAGNOSIS — E876 Hypokalemia: Secondary | ICD-10-CM | POA: Diagnosis not present

## 2019-10-23 LAB — CBC WITH DIFFERENTIAL/PLATELET
Abs Immature Granulocytes: 0.02 10*3/uL (ref 0.00–0.07)
Basophils Absolute: 0.1 10*3/uL (ref 0.0–0.1)
Basophils Relative: 2 %
Eosinophils Absolute: 0.1 10*3/uL (ref 0.0–0.5)
Eosinophils Relative: 3 %
HCT: 31.7 % — ABNORMAL LOW (ref 39.0–52.0)
Hemoglobin: 10.8 g/dL — ABNORMAL LOW (ref 13.0–17.0)
Immature Granulocytes: 1 %
Lymphocytes Relative: 39 %
Lymphs Abs: 1.4 10*3/uL (ref 0.7–4.0)
MCH: 35.1 pg — ABNORMAL HIGH (ref 26.0–34.0)
MCHC: 34.1 g/dL (ref 30.0–36.0)
MCV: 102.9 fL — ABNORMAL HIGH (ref 80.0–100.0)
Monocytes Absolute: 0.3 10*3/uL (ref 0.1–1.0)
Monocytes Relative: 8 %
Neutro Abs: 1.7 10*3/uL (ref 1.7–7.7)
Neutrophils Relative %: 47 %
Platelets: 127 10*3/uL — ABNORMAL LOW (ref 150–400)
RBC: 3.08 MIL/uL — ABNORMAL LOW (ref 4.22–5.81)
RDW: 15.8 % — ABNORMAL HIGH (ref 11.5–15.5)
WBC: 3.5 10*3/uL — ABNORMAL LOW (ref 4.0–10.5)
nRBC: 0 % (ref 0.0–0.2)

## 2019-10-23 LAB — I-STAT CHEM 8, ED
BUN: 8 mg/dL (ref 6–20)
Calcium, Ion: 0.98 mmol/L — ABNORMAL LOW (ref 1.15–1.40)
Chloride: 98 mmol/L (ref 98–111)
Creatinine, Ser: 1.3 mg/dL — ABNORMAL HIGH (ref 0.61–1.24)
Glucose, Bld: 69 mg/dL — ABNORMAL LOW (ref 70–99)
HCT: 32 % — ABNORMAL LOW (ref 39.0–52.0)
Hemoglobin: 10.9 g/dL — ABNORMAL LOW (ref 13.0–17.0)
Potassium: 3.1 mmol/L — ABNORMAL LOW (ref 3.5–5.1)
Sodium: 139 mmol/L (ref 135–145)
TCO2: 25 mmol/L (ref 22–32)

## 2019-10-23 LAB — CK: Total CK: 165 U/L (ref 49–397)

## 2019-10-23 LAB — URINALYSIS, ROUTINE W REFLEX MICROSCOPIC
Bilirubin Urine: NEGATIVE
Glucose, UA: NEGATIVE mg/dL
Hgb urine dipstick: NEGATIVE
Ketones, ur: NEGATIVE mg/dL
Leukocytes,Ua: NEGATIVE
Nitrite: NEGATIVE
Protein, ur: NEGATIVE mg/dL
Specific Gravity, Urine: 1.009 (ref 1.005–1.030)
pH: 7 (ref 5.0–8.0)

## 2019-10-23 LAB — CK TOTAL AND CKMB (NOT AT ARMC)
CK, MB: 2.1 ng/mL (ref 0.5–5.0)
Relative Index: 1.2 (ref 0.0–2.5)
Total CK: 170 U/L (ref 49–397)

## 2019-10-23 MED ORDER — AMOXICILLIN-POT CLAVULANATE 400-57 MG/5ML PO SUSR
800.0000 mg | Freq: Once | ORAL | Status: AC
  Administered 2019-10-23: 800 mg via ORAL
  Filled 2019-10-23: qty 10

## 2019-10-23 MED ORDER — AMOXICILLIN-POT CLAVULANATE 875-125 MG PO TABS: 1.0000 | ORAL_TABLET | Freq: Once | ORAL | Status: DC

## 2019-10-23 MED ORDER — VITAMIN B-1 100 MG PO TABS: 100.0000 mg | ORAL_TABLET | Freq: Every day | ORAL | 0 refills | Status: DC

## 2019-10-23 MED ORDER — AMOXICILLIN-POT CLAVULANATE 875-125 MG PO TABS: 1.0000 | ORAL_TABLET | Freq: Two times a day (BID) | ORAL | 0 refills | Status: DC

## 2019-10-23 MED ORDER — KETOROLAC TROMETHAMINE 30 MG/ML IJ SOLN
15.0000 mg | Freq: Once | INTRAMUSCULAR | Status: AC
  Administered 2019-10-23: 15 mg via INTRAVENOUS
  Filled 2019-10-23: qty 1

## 2019-10-23 MED ORDER — ADULT MULTIVITAMIN W/MINERALS CH
1.0000 | ORAL_TABLET | Freq: Once | ORAL | Status: AC
  Administered 2019-10-23: 1 via ORAL
  Filled 2019-10-23: qty 1

## 2019-10-23 MED ORDER — POTASSIUM CHLORIDE CRYS ER 20 MEQ PO TBCR
40.0000 meq | EXTENDED_RELEASE_TABLET | Freq: Once | ORAL | Status: AC
  Administered 2019-10-23: 40 meq via ORAL
  Filled 2019-10-23: qty 2

## 2019-10-23 MED ORDER — VITAMIN B-1 100 MG PO TABS
500.0000 mg | ORAL_TABLET | Freq: Once | ORAL | Status: AC
  Administered 2019-10-23: 500 mg via ORAL
  Filled 2019-10-23: qty 5

## 2019-10-23 NOTE — ED Notes (Signed)
An After Visit Summary was printed and given to the patient. Discharge instructions given and no further questions at this time. Pt leaving with sister, pt ambulatory with cane
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
## 2019-10-23 NOTE — ED Notes (Addendum)
Pt ambulated to the restroom and back with his cane without incident.

## 2019-10-23 NOTE — ED Notes (Signed)
This nurse attempted IV access twice unsuccessfully, deferred to another nurse for attempt at [place] IV.

## 2019-10-23 NOTE — ED Triage Notes (Signed)
Pt states that his knees started hurting Friday night. And now he is experiencing pain from his hips and pelvis down to his knees. A&Ox4. Hx of tongue cancer. Not on current treatment.

## 2019-10-23 NOTE — ED Notes (Signed)
Pt was made aware of need for urine sample.

## 2019-10-23 NOTE — ED Notes (Signed)
Pt states that since having his G-tube removed, due to his tongue reconstruction from the cancer he is unable to eat or drink certain foods/drinks and hasn't been able to gain any weight. He also states that around a week ago when his lower abdomen/ pelvic area began to swell he hasn't had a bowel movement since. Pt states his father has had his prostate removed for same symptoms. Pt also states he doesn't drink water and he typically drinks a half gallon of vodka in a week due to his depression. Pt denies any SI or HI. No acsities or liver/abdominal tenderness noted.

## 2019-10-23 NOTE — ED Notes (Signed)
Pt unable to provide a urine specimen at this time

## 2019-10-23 NOTE — ED Notes (Signed)
Patient transported to CT 

## 2019-10-26 ENCOUNTER — Telehealth: Payer: Self-pay

## 2019-10-26 NOTE — Telephone Encounter (Signed)
Lanelle Bal,  Patient's wife called. He was seen in ER at [facility] this weekend for Gastroenteritis. He needs a referral for [facility] for this. Can you please place order? Thank you!

## 2019-10-27 ENCOUNTER — Encounter: Payer: Self-pay | Admitting: Family Medicine

## 2019-10-27 ENCOUNTER — Other Ambulatory Visit: Payer: Self-pay | Admitting: Family Medicine

## 2019-10-27 DIAGNOSIS — K529 Noninfective gastroenteritis and colitis, unspecified: Secondary | ICD-10-CM

## 2019-10-27 NOTE — Telephone Encounter (Signed)
Patient made aware. Thanks!  

## 2019-10-30 ENCOUNTER — Other Ambulatory Visit: Payer: Self-pay | Admitting: Family Medicine

## 2019-10-30 DIAGNOSIS — Z8673 Personal history of transient ischemic attack (TIA), and cerebral infarction without residual deficits: Secondary | ICD-10-CM

## 2019-10-30 DIAGNOSIS — E785 Hyperlipidemia, unspecified: Secondary | ICD-10-CM

## 2019-11-01 ENCOUNTER — Encounter: Payer: Self-pay | Admitting: Nurse Practitioner

## 2019-11-08 ENCOUNTER — Ambulatory Visit: Payer: Medicaid Other | Admitting: Nurse Practitioner

## 2019-11-08 ENCOUNTER — Encounter: Payer: Self-pay | Admitting: Nurse Practitioner

## 2019-11-08 VITALS — BP 108/70 | HR 90 | Temp 97.1°F | Ht 69.0 in | Wt 148.6 lb

## 2019-11-08 DIAGNOSIS — R634 Abnormal weight loss: Secondary | ICD-10-CM | POA: Diagnosis not present

## 2019-11-08 DIAGNOSIS — Z800 Family history of malignant neoplasm of digestive organs: Secondary | ICD-10-CM | POA: Diagnosis not present

## 2019-11-08 DIAGNOSIS — K529 Noninfective gastroenteritis and colitis, unspecified: Secondary | ICD-10-CM | POA: Diagnosis not present
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
## 2019-11-08 NOTE — Patient Instructions (Signed)
Our office will contact you to schedule a colonsocopy after Brian Dixon reviews your history with the anesthesiologist

## 2019-11-09 DIAGNOSIS — K529 Noninfective gastroenteritis and colitis, unspecified: Secondary | ICD-10-CM | POA: Insufficient documentation

## 2019-11-09 DIAGNOSIS — Z800 Family history of malignant neoplasm of digestive organs: Secondary | ICD-10-CM | POA: Insufficient documentation

## 2019-11-10 NOTE — Progress Notes (Signed)
Reviewed and agree with management plans. Endoscopy will be scheduled at the hospital given his high risk airway.   Meryl Ponder L. Tarri Glenn, MD, MPH

## 2019-11-15 ENCOUNTER — Telehealth: Payer: Self-pay | Admitting: Nurse Practitioner

## 2019-11-15 NOTE — Telephone Encounter (Signed)
Brian Dixon,    1. Please call the patient to schedule a colonoscopy at [facility] as an outpatient with Dr. Tarri Glenn. Please provide him with the colonoscopy prep and prep instructions. Refer to office consult 11/08/2019.   2. Per anesthesiologist, Dr. Kerin Perna at [facility], pt requires an anesthesia consult 1 week prior to his procedure. Please call Ihechi at [facility] pre admissions testing at [phone] to schedule the pre admissions testing appointment, this appointment should also include anesthesia consult, an anesthesiologist to assess the patient at this appointment time.   3. Please let me know when his pre testing/anesthesia consult appointment and colonoscopy date have been scheduled. Let me know if you have any questions. I spoke to the patient's mother yesterday and I explained all of this to her. She is aware our office would be calling to schedule these appointments.   Thank you,  Jaclyn Shaggy

## 2019-11-15 NOTE — Telephone Encounter (Signed)
Please add this patient to Dr. Tarri Glenn [facility] endo schedule-once Dr. Tarri Glenn has a procedure date-please message me back and I will set this patient up with the requested items from [facility]-  thank you

## 2019-11-15 NOTE — Telephone Encounter (Signed)
Added to Dr Tarri Glenn hospital wait list for January 2021.

## 2019-11-16 ENCOUNTER — Inpatient Hospital Stay: Payer: Self-pay | Admitting: Family Medicine

## 2019-11-25 ENCOUNTER — Other Ambulatory Visit: Payer: Self-pay | Admitting: Family Medicine

## 2019-11-25 DIAGNOSIS — E785 Hyperlipidemia, unspecified: Secondary | ICD-10-CM

## 2019-11-25 DIAGNOSIS — Z8673 Personal history of transient ischemic attack (TIA), and cerebral infarction without residual deficits: Secondary | ICD-10-CM

## 2019-12-06 ENCOUNTER — Other Ambulatory Visit: Payer: Self-pay | Admitting: Emergency Medicine

## 2019-12-06 DIAGNOSIS — K529 Noninfective gastroenteritis and colitis, unspecified: Secondary | ICD-10-CM

## 2019-12-06 DIAGNOSIS — R634 Abnormal weight loss: Secondary | ICD-10-CM

## 2019-12-06 DIAGNOSIS — Z800 Family history of malignant neoplasm of digestive organs: Secondary | ICD-10-CM

## 2019-12-06 MED ORDER — NA SULFATE-K SULFATE-MG SULF 17.5-3.13-1.6 GM/177ML PO SOLN: 1.0000 | ORAL | 0 refills | Status: DC

## 2019-12-12 ENCOUNTER — Other Ambulatory Visit: Payer: Self-pay | Admitting: Family Medicine

## 2019-12-12 DIAGNOSIS — Z8673 Personal history of transient ischemic attack (TIA), and cerebral infarction without residual deficits: Secondary | ICD-10-CM

## 2019-12-12 DIAGNOSIS — E785 Hyperlipidemia, unspecified: Secondary | ICD-10-CM

## 2019-12-20 IMAGING — CT CT RENAL STONE PROTOCOL
2 of 4 series · 16 of 46 positions shown, 18 images · non-contrast
Comparison: PET-CT 09/03/2018

CLINICAL DATA: Pain. History of head neck cancer. Patient reporting
pain from hips and pelvis to the knees.

EXAM:
CT ABDOMEN AND PELVIS WITHOUT CONTRAST
TECHNIQUE: Multidetector CT imaging of the abdomen and pelvis was performed
following the standard protocol without IV contrast.

[Series 2: axial st · axial · 0.73mm/px · z∈[-462,-52]mm · 13 of 92 slices shown, 15 images]
[im 5/92  soft-tissue]
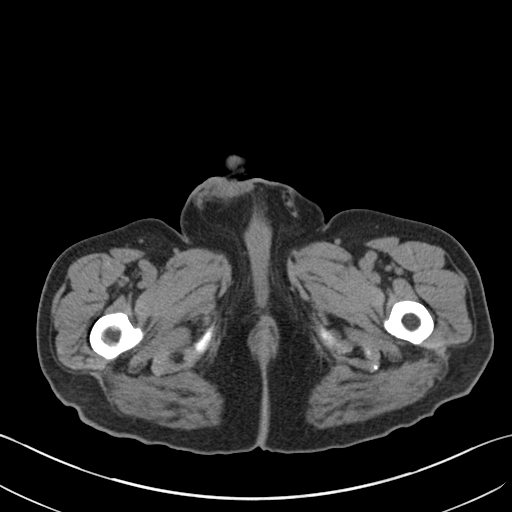
[im 5/92  bone]
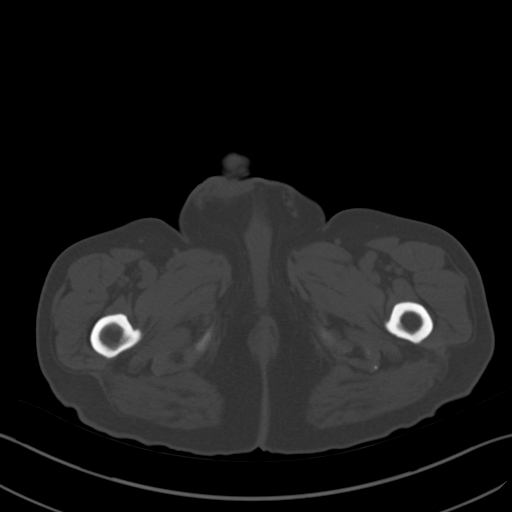
[im 15/92  soft-tissue]
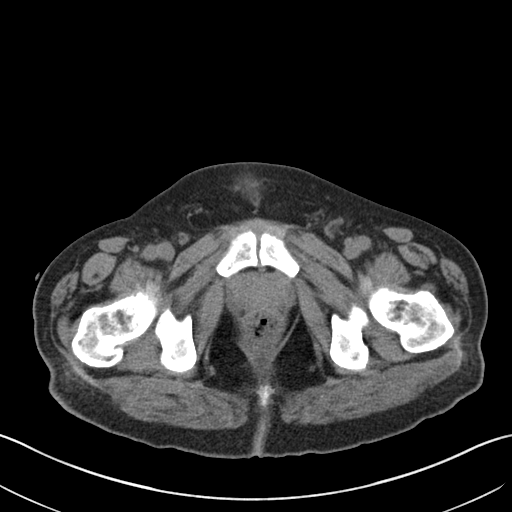
[im 20/92  soft-tissue]
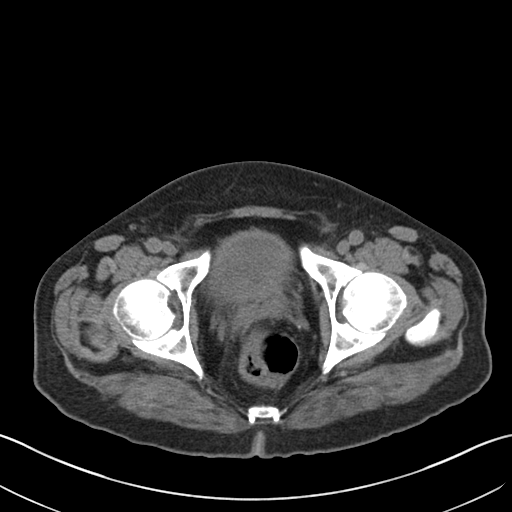
[im 24/92  soft-tissue]
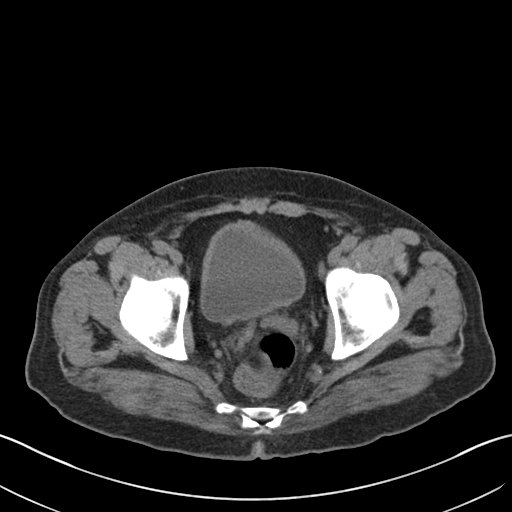
[im 34/92  soft-tissue]
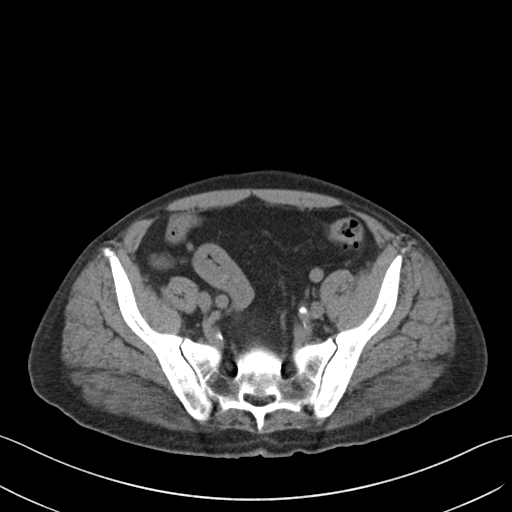
[im 39/92  soft-tissue]
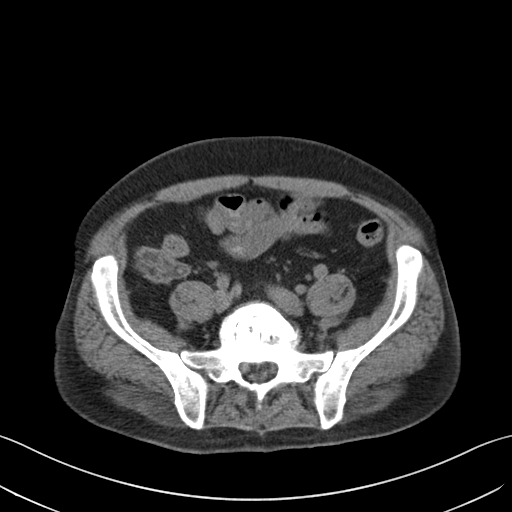
[im 48/92  soft-tissue]
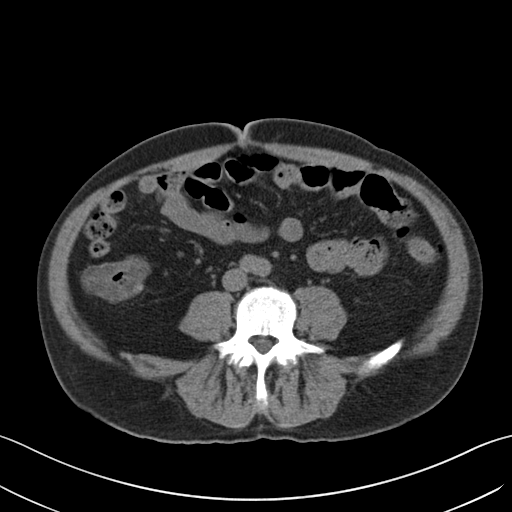
[im 53/92  soft-tissue]
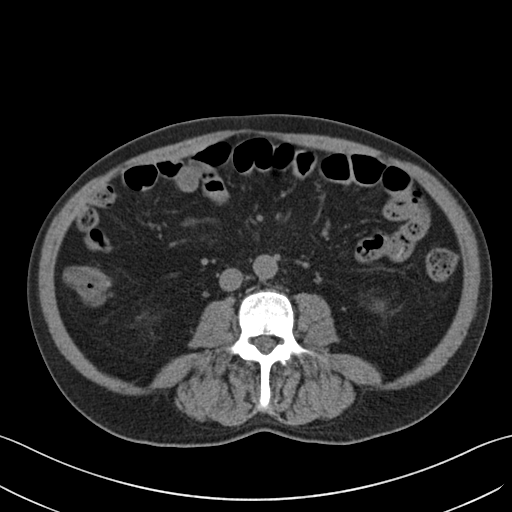
[im 58/92  soft-tissue]
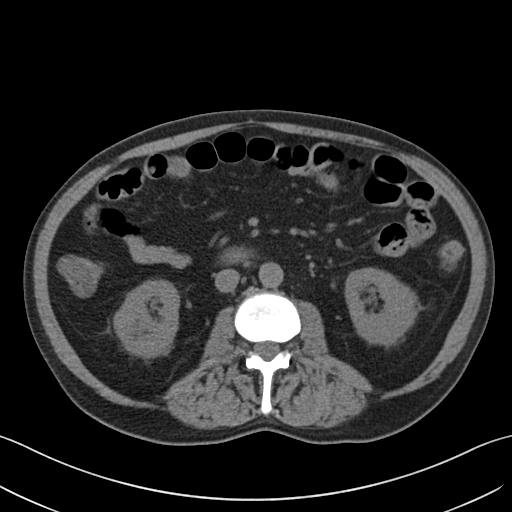
[im 58/92  bone]
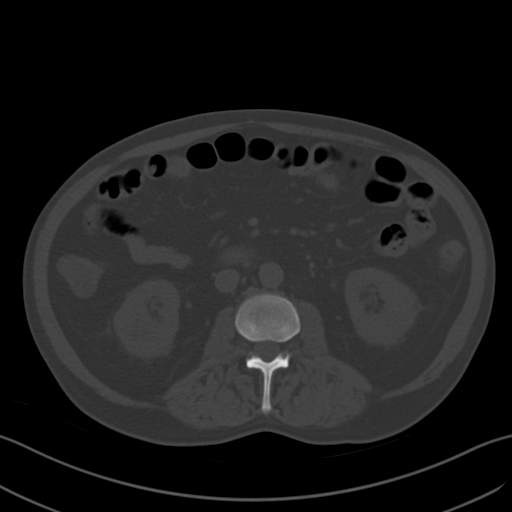
[im 68/92  soft-tissue]
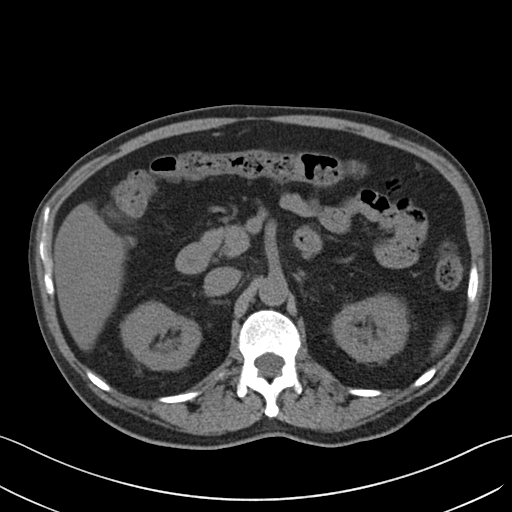
[im 72/92  soft-tissue]
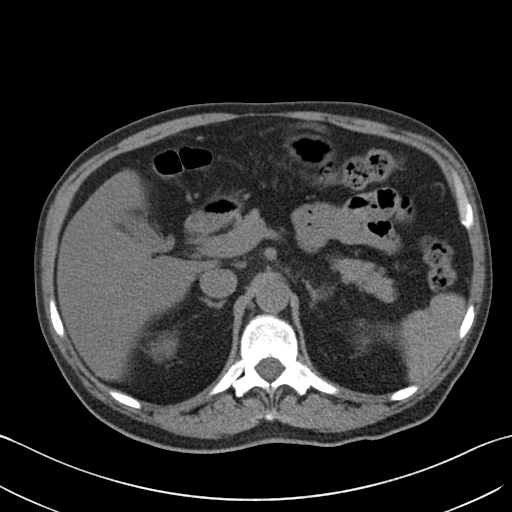
[im 77/92  soft-tissue]
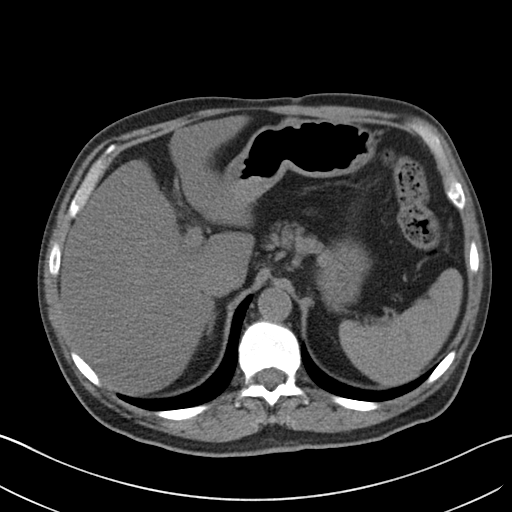
[im 87/92  soft-tissue]
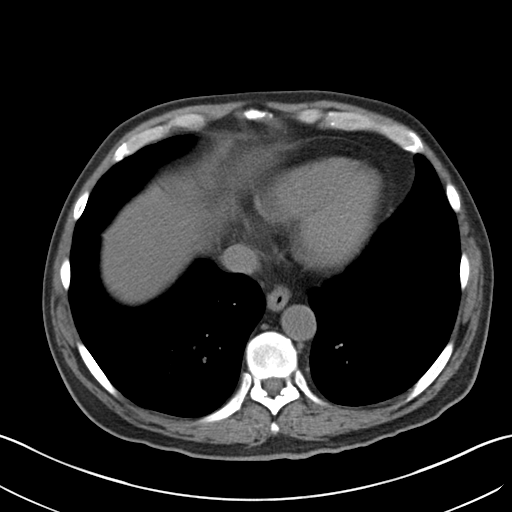

[Series 5: coronal · coronal · 0.74mm/px · 3 of 137 slices shown]
[im 46/137  soft-tissue]
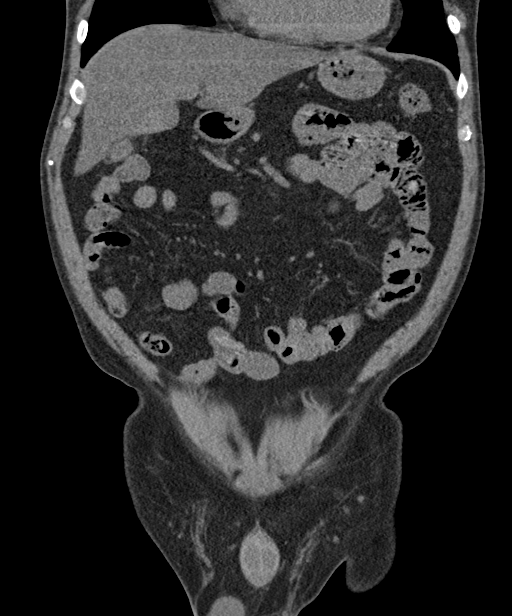
[im 61/137  soft-tissue]
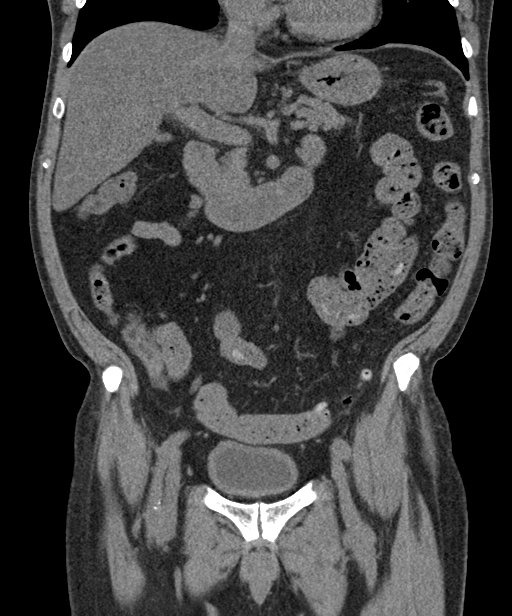
[im 76/137  soft-tissue]
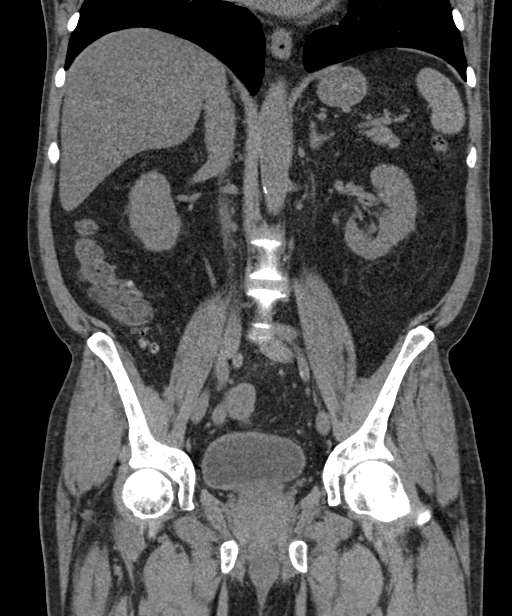

[16 of 46 positions shown; findings below may reference images not displayed]

FINDINGS: Lower chest: No acute abnormality.

Hepatobiliary: There is diffuse hepatic steatosis. The gallbladder
contains multiple small stones measuring up to 3 mm. No gallbladder
wall inflammation or pericholecystic fluid.

Pancreas: Unremarkable. No pancreatic ductal dilatation or
surrounding inflammatory changes.

Spleen: Normal in size without focal abnormality.

Adrenals/Urinary Tract: Normal appearance of the adrenal glands.
cm right kidney cyst is incompletely characterized without IV
contrast. Punctate stone within upper pole collecting system of
right kidney measures 2 mm, image 97/5. No hydronephrosis,
hydroureter or ureteral calculi identified bilaterally. Urinary
bladder normal.

Stomach/Bowel: Stomach is within normal limits. Appendix appears
normal. Wall thickening and intramural fatty deposition involving
the ascending colon noted. No significant pericolonic fat stranding
or free fluid. Scattered colonic diverticulosis identified. No
evidence for acute diverticulitis.

Vascular/Lymphatic: Mild aortic atherosclerosis. No aneurysm. No
abdominopelvic adenopathy identified.

Reproductive: Prostate is unremarkable.

Other: No abdominal wall hernia or abnormality. No abdominopelvic
ascites.

Musculoskeletal: Post op change from previous L5-S1 posterior
decompression and attempted interbody fusion. The L5-S1 disc space
remains distinct. Degenerative disc disease with posterior disc
bulge/herniation noted at L4-5.
IMPRESSION: 1. Mild circumferential wall thickening with intramural fatty
deposition involving the ascending colon. Although findings may in
part reflect incomplete distention of the ascending colon colitis
cannot be excluded.
2. Nonobstructing right renal calculus.
3. Hepatic steatosis.
4. Gallstones.
5. Lumbar spondylosis.

Aortic Atherosclerosis ([id number]).

## 2019-12-21 ENCOUNTER — Ambulatory Visit (INDEPENDENT_AMBULATORY_CARE_PROVIDER_SITE_OTHER): Payer: Medicaid Other | Admitting: Family Medicine

## 2019-12-21 ENCOUNTER — Other Ambulatory Visit: Payer: Self-pay

## 2019-12-21 ENCOUNTER — Encounter: Payer: Self-pay | Admitting: Family Medicine

## 2019-12-21 VITALS — BP 148/91 | HR 89 | Temp 97.4°F | Ht 67.0 in | Wt 152.6 lb

## 2019-12-21 DIAGNOSIS — Z8673 Personal history of transient ischemic attack (TIA), and cerebral infarction without residual deficits: Secondary | ICD-10-CM | POA: Diagnosis not present

## 2019-12-21 DIAGNOSIS — Z0000 Encounter for general adult medical examination without abnormal findings: Secondary | ICD-10-CM | POA: Diagnosis not present

## 2019-12-21 DIAGNOSIS — Z09 Encounter for follow-up examination after completed treatment for conditions other than malignant neoplasm: Secondary | ICD-10-CM | POA: Diagnosis not present

## 2019-12-21 DIAGNOSIS — I10 Essential (primary) hypertension: Secondary | ICD-10-CM | POA: Diagnosis not present

## 2019-12-21 DIAGNOSIS — G629 Polyneuropathy, unspecified: Secondary | ICD-10-CM

## 2019-12-21 DIAGNOSIS — C090 Malignant neoplasm of tonsillar fossa: Secondary | ICD-10-CM

## 2019-12-21 DIAGNOSIS — R531 Weakness: Secondary | ICD-10-CM | POA: Diagnosis not present

## 2019-12-21 MED ORDER — GABAPENTIN 300 MG PO CAPS: 300.0000 mg | ORAL_CAPSULE | Freq: Two times a day (BID) | ORAL | 3 refills | Status: DC
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
## 2019-12-28 ENCOUNTER — Other Ambulatory Visit: Payer: Self-pay | Admitting: Family Medicine

## 2019-12-28 DIAGNOSIS — I160 Hypertensive urgency: Secondary | ICD-10-CM

## 2019-12-28 DIAGNOSIS — I10 Essential (primary) hypertension: Secondary | ICD-10-CM

## 2020-01-17 ENCOUNTER — Telehealth: Payer: Self-pay | Admitting: Gastroenterology

## 2020-01-17 NOTE — Telephone Encounter (Signed)
Spoke with patient he had some questions as far as the prep for his upcoming procedure. All questions were answered and he verbalized understanding. He also wanted to note that he is now taking Gabapentin and wanted to be sure it was ok to take.

## 2020-01-26 ENCOUNTER — Other Ambulatory Visit (HOSPITAL_COMMUNITY): Payer: Medicaid Other

## 2020-01-27 ENCOUNTER — Other Ambulatory Visit (HOSPITAL_COMMUNITY): Payer: Medicaid Other

## 2020-02-03 ENCOUNTER — Other Ambulatory Visit (HOSPITAL_COMMUNITY): Payer: Medicaid Other

## 2020-02-07 ENCOUNTER — Encounter (HOSPITAL_COMMUNITY): Payer: Self-pay

## 2020-02-07 ENCOUNTER — Ambulatory Visit (HOSPITAL_COMMUNITY): Admit: 2020-02-07 | Payer: No Typology Code available for payment source | Admitting: Gastroenterology

## 2020-02-07 SURGERY — COLONOSCOPY WITH PROPOFOL
Anesthesia: Monitor Anesthesia Care

## 2020-02-13 ENCOUNTER — Other Ambulatory Visit: Payer: Self-pay | Admitting: Emergency Medicine

## 2020-02-13 DIAGNOSIS — R634 Abnormal weight loss: Secondary | ICD-10-CM

## 2020-02-13 DIAGNOSIS — K529 Noninfective gastroenteritis and colitis, unspecified: Secondary | ICD-10-CM

## 2020-02-13 DIAGNOSIS — Z800 Family history of malignant neoplasm of digestive organs: Secondary | ICD-10-CM

## 2020-02-13 NOTE — Addendum Note (Signed)
Addended by: Wyline Beady on: 02/13/2020 10:46 AM   Modules accepted: Orders

## 2020-02-24 ENCOUNTER — Ambulatory Visit
Admission: RE | Admit: 2020-02-24 | Discharge: 2020-02-24 | Disposition: A | Discharge status: Home / Self Care | Payer: No Typology Code available for payment source | Source: Ambulatory Visit | Attending: Radiation Oncology | Admitting: Radiation Oncology

## 2020-02-24 ENCOUNTER — Telehealth: Payer: Self-pay | Admitting: *Deleted

## 2020-02-24 ENCOUNTER — Ambulatory Visit: Payer: No Typology Code available for payment source

## 2020-02-24 NOTE — Telephone Encounter (Signed)
CALLED PATIENT TO RESCHEDULE TODAY'S LAB AND FU, RESCHEDULED FOR 03-09-20, PATIENT AGREED TO NEW DATE AND TIME

## 2020-03-02 ENCOUNTER — Other Ambulatory Visit (HOSPITAL_COMMUNITY)
Admission: RE | Admit: 2020-03-02 | Discharge: 2020-03-02 | Disposition: A | Discharge status: Home / Self Care | Payer: Medicaid Other | Source: Ambulatory Visit | Attending: Gastroenterology | Admitting: Gastroenterology

## 2020-03-02 DIAGNOSIS — Z01812 Encounter for preprocedural laboratory examination: Secondary | ICD-10-CM | POA: Diagnosis present

## 2020-03-02 DIAGNOSIS — Z20822 Contact with and (suspected) exposure to covid-19: Secondary | ICD-10-CM | POA: Insufficient documentation

## 2020-03-02 LAB — SARS CORONAVIRUS 2 (TAT 6-24 HRS): SARS Coronavirus 2: NEGATIVE

## 2020-03-06 ENCOUNTER — Ambulatory Visit (HOSPITAL_COMMUNITY): Payer: Medicaid Other | Admitting: Anesthesiology

## 2020-03-06 ENCOUNTER — Ambulatory Visit (HOSPITAL_COMMUNITY)
Admission: RE | Admit: 2020-03-06 | Discharge: 2020-03-06 | Disposition: A | Discharge status: Home / Self Care | Payer: Medicaid Other | Attending: Gastroenterology | Admitting: Gastroenterology

## 2020-03-06 ENCOUNTER — Other Ambulatory Visit: Payer: Self-pay

## 2020-03-06 ENCOUNTER — Encounter (HOSPITAL_COMMUNITY): Payer: Self-pay | Admitting: Gastroenterology

## 2020-03-06 ENCOUNTER — Encounter (HOSPITAL_COMMUNITY)
Admission: RE | Disposition: A | Discharge status: Home / Self Care | Payer: Self-pay | Source: Home / Self Care | Attending: Gastroenterology

## 2020-03-06 DIAGNOSIS — Z800 Family history of malignant neoplasm of digestive organs: Secondary | ICD-10-CM

## 2020-03-06 DIAGNOSIS — Z79899 Other long term (current) drug therapy: Secondary | ICD-10-CM | POA: Insufficient documentation

## 2020-03-06 DIAGNOSIS — Z85810 Personal history of malignant neoplasm of tongue: Secondary | ICD-10-CM | POA: Diagnosis not present

## 2020-03-06 DIAGNOSIS — I119 Hypertensive heart disease without heart failure: Secondary | ICD-10-CM | POA: Diagnosis not present

## 2020-03-06 DIAGNOSIS — I10 Essential (primary) hypertension: Secondary | ICD-10-CM | POA: Diagnosis not present

## 2020-03-06 DIAGNOSIS — D123 Benign neoplasm of transverse colon: Secondary | ICD-10-CM | POA: Diagnosis not present

## 2020-03-06 DIAGNOSIS — R102 Pelvic and perineal pain: Secondary | ICD-10-CM | POA: Diagnosis not present

## 2020-03-06 DIAGNOSIS — D125 Benign neoplasm of sigmoid colon: Secondary | ICD-10-CM | POA: Diagnosis not present

## 2020-03-06 DIAGNOSIS — Z923 Personal history of irradiation: Secondary | ICD-10-CM | POA: Diagnosis not present

## 2020-03-06 DIAGNOSIS — D122 Benign neoplasm of ascending colon: Secondary | ICD-10-CM | POA: Insufficient documentation

## 2020-03-06 DIAGNOSIS — D539 Nutritional anemia, unspecified: Secondary | ICD-10-CM | POA: Insufficient documentation

## 2020-03-06 DIAGNOSIS — K635 Polyp of colon: Secondary | ICD-10-CM

## 2020-03-06 DIAGNOSIS — K5730 Diverticulosis of large intestine without perforation or abscess without bleeding: Secondary | ICD-10-CM | POA: Insufficient documentation

## 2020-03-06 DIAGNOSIS — F17210 Nicotine dependence, cigarettes, uncomplicated: Secondary | ICD-10-CM | POA: Diagnosis not present

## 2020-03-06 DIAGNOSIS — I517 Cardiomegaly: Secondary | ICD-10-CM | POA: Diagnosis not present

## 2020-03-06 DIAGNOSIS — R634 Abnormal weight loss: Secondary | ICD-10-CM

## 2020-03-06 DIAGNOSIS — G629 Polyneuropathy, unspecified: Secondary | ICD-10-CM | POA: Diagnosis not present

## 2020-03-06 DIAGNOSIS — D124 Benign neoplasm of descending colon: Secondary | ICD-10-CM | POA: Insufficient documentation

## 2020-03-06 DIAGNOSIS — Z9889 Other specified postprocedural states: Secondary | ICD-10-CM

## 2020-03-06 DIAGNOSIS — E7800 Pure hypercholesterolemia, unspecified: Secondary | ICD-10-CM | POA: Diagnosis not present

## 2020-03-06 DIAGNOSIS — I739 Peripheral vascular disease, unspecified: Secondary | ICD-10-CM | POA: Diagnosis not present

## 2020-03-06 DIAGNOSIS — K219 Gastro-esophageal reflux disease without esophagitis: Secondary | ICD-10-CM | POA: Diagnosis not present

## 2020-03-06 DIAGNOSIS — R1310 Dysphagia, unspecified: Secondary | ICD-10-CM | POA: Diagnosis not present

## 2020-03-06 DIAGNOSIS — Z8673 Personal history of transient ischemic attack (TIA), and cerebral infarction without residual deficits: Secondary | ICD-10-CM | POA: Insufficient documentation

## 2020-03-06 DIAGNOSIS — D120 Benign neoplasm of cecum: Secondary | ICD-10-CM | POA: Diagnosis not present

## 2020-03-06 DIAGNOSIS — K529 Noninfective gastroenteritis and colitis, unspecified: Secondary | ICD-10-CM

## 2020-03-06 HISTORY — PX: POLYPECTOMY: SHX5525

## 2020-03-06 HISTORY — PX: COLONOSCOPY WITH PROPOFOL: SHX5780

## 2020-03-06 HISTORY — DX: Other specified postprocedural states: Z98.890

## 2020-03-06 SURGERY — COLONOSCOPY WITH PROPOFOL
Anesthesia: Monitor Anesthesia Care

## 2020-03-06 MED ORDER — GLUCAGON HCL RDNA (DIAGNOSTIC) 1 MG IJ SOLR
INTRAMUSCULAR | Status: AC
  Filled 2020-03-06: qty 1

## 2020-03-06 MED ORDER — SODIUM CHLORIDE 0.9 % IV SOLN: INTRAVENOUS | Status: DC

## 2020-03-06 MED ORDER — LACTATED RINGERS IV SOLN
INTRAVENOUS | Status: DC
  Administered 2020-03-06: 1000 mL via INTRAVENOUS

## 2020-03-06 MED ORDER — LACTATED RINGERS IV SOLN: INTRAVENOUS | Status: DC

## 2020-03-06 MED ORDER — ONDANSETRON HCL 4 MG/2ML IJ SOLN
INTRAMUSCULAR | Status: DC | PRN
  Administered 2020-03-06: 4 mg via INTRAVENOUS

## 2020-03-06 MED ORDER — LIDOCAINE HCL (CARDIAC) PF 100 MG/5ML IV SOSY
PREFILLED_SYRINGE | INTRAVENOUS | Status: DC | PRN
  Administered 2020-03-06: 100 mg via INTRAVENOUS

## 2020-03-06 MED ORDER — PROPOFOL 10 MG/ML IV BOLUS
INTRAVENOUS | Status: DC | PRN
  Administered 2020-03-06: 20 mg via INTRAVENOUS
  Administered 2020-03-06 (×2): 10 mg via INTRAVENOUS

## 2020-03-06 MED ORDER — PROPOFOL 500 MG/50ML IV EMUL
INTRAVENOUS | Status: DC | PRN
  Administered 2020-03-06: 20 mg via INTRAVENOUS
  Administered 2020-03-06: 100 ug/kg/min via INTRAVENOUS

## 2020-03-06 MED ORDER — GLYCOPYRROLATE 0.2 MG/ML IJ SOLN
INTRAMUSCULAR | Status: DC | PRN
  Administered 2020-03-06 (×2): .1 mg via INTRAVENOUS

## 2020-03-06 SURGICAL SUPPLY — 21 items

## 2020-03-06 NOTE — Anesthesia Postprocedure Evaluation (Signed)
Anesthesia Post Note  Patient: Brian Dixon  Procedure(s) Performed: COLONOSCOPY WITH PROPOFOL (N/A ) POLYPECTOMY     Patient location during evaluation: PACU Anesthesia Type: MAC Level of consciousness: awake and alert Pain management: pain level controlled Vital Signs Assessment: post-procedure vital signs reviewed and stable Respiratory status: spontaneous breathing, nonlabored ventilation and respiratory function stable Cardiovascular status: blood pressure returned to baseline and stable Postop Assessment: no apparent nausea or vomiting Anesthetic complications: no    Last Vitals:  Vitals:   03/06/20 0939 03/06/20 0940  BP:  (!) 161/88  Pulse: 67 65  Resp: 11 11  Temp:    SpO2: 100% 100%    Last Pain:  Vitals:   03/06/20 0939  TempSrc:   PainSc: 0-No pain                 Pervis Hocking

## 2020-03-06 NOTE — Anesthesia Preprocedure Evaluation (Addendum)
Anesthesia Evaluation  Patient identified by MRN, date of birth, ID band Patient awake    Reviewed: Allergy & Precautions, NPO status , Patient's Chart, lab work & pertinent test results  History of Anesthesia Complications Negative for: history of anesthetic complications  Airway Mallampati: II  TM Distance: <3 FB Neck ROM: Limited  Mouth opening: Limited Mouth Opening  Dental  (+) Edentulous Upper, Edentulous Lower, Dental Advisory Given   Pulmonary Current Smoker,  Still smoking, 70 pack year history. Smoking about 1/2ppd currently   breath sounds clear to auscultation       Cardiovascular hypertension, Pt. on medications + Peripheral Vascular Disease   Rhythm:Regular Rate:Normal  TTE 08/06/18 EF 55-60%, no valvular abnormalities, possible PFO  HLD   Neuro/Psych TIAnegative psych ROS   GI/Hepatic Neg liver ROS, GERD  Medicated and Controlled,Abnormal CT, anemia, family hx CRC   Endo/Other  negative endocrine ROS  Renal/GU negative Renal ROS  negative genitourinary   Musculoskeletal negative musculoskeletal ROS (+)   Abdominal Normal abdominal exam  (+)   Peds negative pediatric ROS (+)  Hematology negative hematology ROS (+)   Anesthesia Other Findings Hx oral cancer (tonsillar fossa) s/p pharyngectomy, partial glossectomy, mandibulectomy, trach 2019  Reproductive/Obstetrics negative OB ROS                            Anesthesia Physical Anesthesia Plan  ASA: III  Anesthesia Plan: MAC   Post-op Pain Management:    Induction:   PONV Risk Score and Plan: 2 and Propofol infusion and TIVA  Airway Management Planned: Natural Airway and Simple Face Mask  Additional Equipment: None  Intra-op Plan:   Post-operative Plan:   Informed Consent: I have reviewed the patients History and Physical, chart, labs and discussed the procedure including the risks, benefits and alternatives for  the proposed anesthesia with the patient or authorized representative who has indicated his/her understanding and acceptance.       Plan Discussed with: CRNA  Anesthesia Plan Comments: (Suspect complicated airway, will do sedation with LMA as backup)        Anesthesia Quick Evaluation

## 2020-03-06 NOTE — Transfer of Care (Signed)
Immediate Anesthesia Transfer of Care Note  Patient: Brian Dixon  Procedure(s) Performed: Procedure(s): COLONOSCOPY WITH PROPOFOL (N/A) POLYPECTOMY  Patient Location: PACU  Anesthesia Type:MAC  Level of Consciousness:  sedated, patient cooperative and responds to stimulation  Airway & Oxygen Therapy:Patient Spontanous Breathing and Patient connected to face mask oxgen  Post-op Assessment:  Report given to PACU RN and Post -op Vital signs reviewed and stable  Post vital signs:  Reviewed and stable  Last Vitals:  Vitals:   03/06/20 0737  BP: (!) 162/94  Pulse: 68  Resp: 19  Temp: 36.8 C  SpO2: 14%    Complications: No apparent anesthesia complications

## 2020-03-06 NOTE — Discharge Instructions (Signed)
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
## 2020-03-06 NOTE — Op Note (Signed)
[facility] Patient Name: Brian Dixon Procedure Date: 03/06/2020 MRN: [id number] Attending MD: Thornton Park MD, MD Date of Birth: 01/21/1966 CSN: [id number] Age: 54 Admit Type: Outpatient Procedure:                Colonoscopy Indications:              Pelvic pain - lower pelvic pain, abdominal/pelvic                            CT identified possible ascending colitis. His                            pelvic pain resolved after taking Augmentin 875 mg                            p.o. twice daily for 7 days. No diarrhea.                           His maternal grandmother had colon cancer, not sure                            how old she was when diagnosed colon cancer. Providers:                Thornton Park MD, MD, Cleda Daub, RN, Laverda Sorenson, Technician, Arnoldo Hooker, CRNA Referring MD:              Medicines:                Monitored Anesthesia Care Complications:            No immediate complications. Estimated blood loss:                            Minimal. Estimated Blood Loss:     Estimated blood loss was minimal. Procedure:                Pre-Anesthesia Assessment:                           - Prior to the procedure, a History and Physical                            was performed, and patient medications and                            allergies were reviewed. The patient's tolerance of                            previous anesthesia was also reviewed. The risks                            and benefits of the procedure and the sedation  options and risks were discussed with the patient.                            All questions were answered, and informed consent                            was obtained. Prior Anticoagulants: The patient has                            taken no previous anticoagulant or antiplatelet                            agents. ASA Grade Assessment: III - A patient with            severe systemic disease. After reviewing the risks                            and benefits, the patient was deemed in                            satisfactory condition to undergo the procedure.                           After obtaining informed consent, the colonoscope                            was passed under direct vision. Throughout the                            procedure, the patient's blood pressure, pulse, and                            oxygen saturations were monitored continuously. The                            [id number] [id number] Olympus colonoscope was                            introduced through the anus and advanced to the 6                            cm into the ileum. A second forward view of the                            right colon was performed. The colonoscopy was                            performed without difficulty. The patient tolerated                            the procedure well. The quality of the bowel                            preparation was good.  The terminal ileum, ileocecal                            valve, appendiceal orifice, and rectum were                            photographed. Scope In: 8:41:52 AM Scope Out: 9:18:48 AM Scope Withdrawal Time: 0 hours 33 minutes 56 seconds  Total Procedure Duration: 0 hours 36 minutes 56 seconds  Findings:      The perianal and digital rectal examinations were normal.      Multiple small and large-mouthed diverticula were found in the sigmoid       colon, descending colon and ascending colon.      A 6 mm polyp was found in the cecum. The polyp was sessile. The polyp       was removed with a cold snare. Resection and retrieval were complete.       Estimated blood loss was minimal.      Two sessile polyps were found in the ascending colon. The polyps were 4       to 5 mm in size. These polyps were removed with a cold snare. Resection       and retrieval were complete. Estimated blood loss was minimal.       A 3 mm polyp was found in the hepatic flexure. The polyp was sessile.       The polyp was removed with a cold snare. Resection and retrieval were       complete. Estimated blood loss was minimal.      A 10 mm polyp was found in the sigmoid colon 36cm from the anal verge.       The polyp was pedunculated. The polyp was removed with a hot snare.       Resection and retrieval were complete. Estimated blood loss: none.      A 12 mm polyp was found in the sigmoid colon 32 cm from the anal verge.       The polyp was pedunculated. The polyp was removed with a hot snare.       Resection and retrieval were complete. Estimated blood loss: none.      A 14 mm polyp was found in the sigmoid colon 28 cm from the anal verge.       The polyp was pedunculated and had a broad based stalk. An endoloop was       maneuvered over the polyp stalk and closed at the mucosal attachment       prior to removal in order to prevent bleeding. The polyp was then       removed with a hot snare. Resection and retrieval were complete.       Estimated blood loss: none.      The exam was otherwise without abnormality on direct and retroflexion       views. Impression:               - Diverticulosis in the sigmoid colon, in the                            descending colon and in the ascending colon.                           - One  6 mm polyp in the cecum, removed with a cold                            snare. Resected and retrieved.                           - Two 4 to 5 mm polyps in the ascending colon,                            removed with a cold snare. Resected and retrieved.                           - One 3 mm polyp at the hepatic flexure, removed                            with a cold snare. Resected and retrieved.                           - One 10 mm polyp in the sigmoid colon, removed                            with a hot snare. Resected and retrieved.                           - One 12 mm polyp in the sigmoid  colon, removed                            with a hot snare. Resected and retrieved.                           - One 14 mm polyp in the sigmoid colon. Resected                            and retrieved.                           - The examination was otherwise normal on direct                            and retroflexion views.                           - No source for groin pain identified on this                            study. There was no colitis seen. Moderate Sedation:      Not Applicable - Patient had care per Anesthesia. Recommendation:           - Patient has a contact number available for                            emergencies. The signs and symptoms of potential  delayed complications were discussed with the                            patient. Return to normal activities tomorrow.                            Written discharge instructions were provided to the                            patient.                           - High fiber diet. Drink at least 64 ounces of                            water daily. Add a daily stool bulking agent such                            as Metamucil or Benefiber.                           - Continue present medications.                           - Await pathology results.                           - Repeat colonoscopy date to be determined after                            pending pathology results are reviewed for                            surveillance.                           - Given these results, all first degree family                            members (children, parents, brothers, sisters)                            should start colon cancer screening at age 71. Procedure Code(s):        --- Professional ---                           [id number], Colonoscopy, flexible; with removal of                            tumor(s), polyp(s), or other lesion(s) by snare                            technique Diagnosis Code(s):         --- Professional ---  K63.5, Polyp of colon                           R10.2, Pelvic and perineal pain                           K57.30, Diverticulosis of large intestine without                            perforation or abscess without bleeding CPT copyright 2019 American Medical Association. All rights reserved. The codes documented in this report are preliminary and upon coder review may  be revised to meet current compliance requirements. Thornton Park MD, MD 03/06/2020 9:34:48 AM This report has been signed electronically. Number of Addenda: 0

## 2020-03-07 ENCOUNTER — Encounter: Payer: Self-pay | Admitting: *Deleted

## 2020-03-07 ENCOUNTER — Encounter: Payer: Self-pay | Admitting: Gastroenterology

## 2020-03-07 LAB — SURGICAL PATHOLOGY

## 2020-03-08 NOTE — Progress Notes (Signed)
Brian Dixon presents for follow up of radiation completed 01/12/19 to his Oropharynx and tongue.    Pain issues, if any: Yes, he reports pain to bilateral legs related to neuropathy. He is taking gabapentin which wears off in the afternoon.  Using a feeding tube?: It has been removed. Weight changes, if any:  Wt Readings from Last 3 Encounters:  03/09/20 158 lb (71.7 kg)  03/06/20 155 lb (70.3 kg)  12/21/19 152 lb 9.6 oz (69.2 kg)   Swallowing issues, if any: He is eating softer foods. He reports that bread gets hung in his throat. He needs to moisten his food to help him swallow.  Smoking or chewing tobacco? Yes, he is smoking about 3 cigarettes daily.  Using fluoride trays daily?  Last ENT visit was on: Dr. Vertell Limber 06/27/19, he missed an appointment in October. He was scheduled to see Dr. Vertell Limber on 02/28/20 but did not have a ride there and was not able to go.  Other notable issues, if any:   BP (!) 169/105 (BP Location: Left Arm, Patient Position: Sitting, Cuff Size: Large)   Pulse (!) 115   Temp 98.9 F (37.2 C)   Resp 20   Wt 158 lb (71.7 kg)   SpO2 100%   BMI 24.75 kg/m

## 2020-03-09 ENCOUNTER — Ambulatory Visit
Admission: RE | Admit: 2020-03-09 | Discharge: 2020-03-09 | Disposition: A | Discharge status: Home / Self Care | Payer: Medicaid Other | Source: Ambulatory Visit | Attending: Radiation Oncology | Admitting: Radiation Oncology

## 2020-03-09 ENCOUNTER — Other Ambulatory Visit: Payer: Self-pay

## 2020-03-09 ENCOUNTER — Encounter: Payer: Self-pay | Admitting: Radiation Oncology

## 2020-03-09 DIAGNOSIS — R7989 Other specified abnormal findings of blood chemistry: Secondary | ICD-10-CM

## 2020-03-09 DIAGNOSIS — Z79899 Other long term (current) drug therapy: Secondary | ICD-10-CM | POA: Diagnosis not present

## 2020-03-09 DIAGNOSIS — Z1329 Encounter for screening for other suspected endocrine disorder: Secondary | ICD-10-CM

## 2020-03-09 DIAGNOSIS — Z923 Personal history of irradiation: Secondary | ICD-10-CM | POA: Diagnosis not present

## 2020-03-09 DIAGNOSIS — Z08 Encounter for follow-up examination after completed treatment for malignant neoplasm: Secondary | ICD-10-CM | POA: Diagnosis not present

## 2020-03-09 DIAGNOSIS — Z85819 Personal history of malignant neoplasm of unspecified site of lip, oral cavity, and pharynx: Secondary | ICD-10-CM | POA: Insufficient documentation

## 2020-03-09 DIAGNOSIS — Z85818 Personal history of malignant neoplasm of other sites of lip, oral cavity, and pharynx: Secondary | ICD-10-CM | POA: Diagnosis not present

## 2020-03-09 DIAGNOSIS — C090 Malignant neoplasm of tonsillar fossa: Secondary | ICD-10-CM | POA: Diagnosis present

## 2020-03-09 DIAGNOSIS — F17210 Nicotine dependence, cigarettes, uncomplicated: Secondary | ICD-10-CM | POA: Insufficient documentation

## 2020-03-09 NOTE — Progress Notes (Signed)
Radiation Oncology         [phone] ________________________________  Name: Brian Dixon MRN: [id number]  Date: 03/09/2020  DOB: August 23, 1966  Follow-Up in person  Outpatient  CC: Azzie Glatter, FNP  Melida Quitter, MD  Diagnosis and Prior Radiotherapy:        ICD-10-CM   1. 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[place])  C09.0     Cancer Staging 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[place]) Staging form: Pharynx - P16 Negative Oropharynx, AJCC 8th Edition - Clinical stage from 09/07/2018: Stage III (cT3, cN0, cM0, p16-) - Signed by Eppie Gibson, MD on 09/07/2018 - Pathologic: Stage III (pT3, pN0, cM0, p16-) - Signed by Eppie Gibson, MD on 11/05/2018  Radiation treatment dates:   11/29/2018-01/12/2019  Site/dose:     Oropharynx and tongue / total dose, 60Gy in 30 fractions  CHIEF COMPLAINT:   Here for follow-up of throat cancer  NARRATIVE: Mr. Mulkins presents for follow up of radiation completed 01/12/19 to his Oropharynx and tongue.    Pain issues, if any: Yes, he reports pain to bilateral legs related to neuropathy. He is taking gabapentin which wears off in the afternoon.  This is managed by his PCP. Using a feeding tube?: It has been removed. Weight changes, if any:  Wt Readings from Last 3 Encounters:  03/09/20 158 lb (71.7 kg)  03/06/20 155 lb (70.3 kg)  12/21/19 152 lb 9.6 oz (69.2 kg)   Swallowing issues, if any: He is eating softer foods. He reports that bread gets hung in his throat. He needs to moisten his food to help him swallow.  Smoking or chewing tobacco? Yes, he is smoking about 3 cigarettes daily.  Using fluoride trays daily?  Last ENT visit was on: Dr. Vertell Limber 06/27/19, he missed an appointment in October. He was scheduled to see Dr. Vertell Limber on 02/28/20 but did not have a ride there and was not able to go.  Other notable issues, if any: His father recently had a stroke.  He reports that he drives his parents wherever they need to go and they are becoming  more reliant on him.  He continues to receive disability payments and is not currently working.  He states he would like to eventually work but he reports that he worries that his cancer diagnosis is considered a liability.  ALLERGIES:  is allergic to shellfish allergy.  Meds: Current Outpatient Medications  Medication Sig Dispense Refill  . amLODipine (NORVASC) 5 MG tablet TAKE 1 TABLET(5 MG) BY MOUTH DAILY (Patient taking differently: Take 5 mg by mouth daily. ) 90 tablet 2  . antiseptic oral rinse (BIOTENE) LIQD 15 mLs by Mouth Rinse route as needed for dry mouth.    Marland Kitchen atorvastatin (LIPITOR) 40 MG tablet TAKE 1 TABLET BY MOUTH EVERY DAY AT 6PM (Patient taking differently: Take 40 mg by mouth daily at 6 PM. ) 90 tablet 1  . Dentifrices (BIOTENE DRY MOUTH GENTLE) PSTE Place 1 application onto teeth in the morning and at bedtime.    Marland Kitchen dextromethorphan-guaiFENesin (MUCINEX DM) 30-600 MG 12hr tablet Take 1 tablet by mouth at bedtime.    . gabapentin (NEURONTIN) 300 MG capsule Take 1 capsule (300 mg total) by mouth 2 (two) times daily. (Patient taking differently: Take 600 mg by mouth daily at 10 pm. ) 60 capsule 3  . Glycerin-Hypromellose-PEG 400 (DRY EYE RELIEF DROPS) 0.2-0.2-1 % SOLN Place 1 drop into both eyes 3 (three) times daily as needed (dry/irritated eyes.).    Marland Kitchen  lisinopril (ZESTRIL) 10 MG tablet TAKE 1 TABLET(10 MG) BY MOUTH DAILY (Patient taking differently: Take 10 mg by mouth daily. ) 90 tablet 1  . Menthol-Camphor (ICY HOT ADVANCED RELIEF) 16-11 % CREA Apply 1 application topically 3 (three) times daily as needed (pain).    Marland Kitchen omeprazole (PRILOSEC) 20 MG capsule TAKE 1 CAPSULE(20 MG) BY MOUTH DAILY (Patient taking differently: Take 20 mg by mouth daily. ) 90 capsule 3  . thiamine (VITAMIN B-1) 100 MG tablet Take 1 tablet (100 mg total) by mouth daily. 30 tablet 0   No current facility-administered medications for this encounter.    Physical Findings:   Wt Readings from Last 3  Encounters:  03/09/20 158 lb (71.7 kg)  03/06/20 155 lb (70.3 kg)  12/21/19 152 lb 9.6 oz (69.2 kg)    weight is 158 lb (71.7 kg). His temperature is 98.9 F (37.2 C). His blood pressure is 169/105 (abnormal) and his pulse is 115 (abnormal). His respiration is 20 and oxygen saturation is 100%. .  General: Alert and oriented, in no acute distress HEENT: Head is normocephalic. Extraocular movements are intact. Oropharynx is clear.  Oral mucosa is slightly dry.  No thrush Neck: Neck is supple, no palpable cervical or supraclavicular lymphadenopathy. Heart is slightly tachycardic.  Regular in rhythm.  No murmurs Chest is clear to auscultation bilaterally Lymphatics: see Neck Exam Skin: No concerning lesions.     Lab Findings: Lab Results  Component Value Date   WBC 3.5 (L) 10/23/2019   HGB 10.9 (L) 10/23/2019   HCT 32.0 (L) 10/23/2019   MCV 102.9 (H) 10/23/2019   PLT 127 (L) 10/23/2019    Lab Results  Component Value Date   TSH 1.645 04/15/2019    Radiographic Findings: No results found.  Impression/Plan:    1) Head and Neck Cancer Status: in remission, NED  2) Nutritional Status: no issues PEG tube: removed 05/2019  3) Risk Factors: The patient has been educated about risk factors including alcohol and tobacco abuse; they understand that avoidance of alcohol and tobacco is important to prevent recurrences as well as other cancers. Unfortunately, he is still smoking.  Urged again to cut back.  4) Swallowing: able to swallow soft foods. Continue SLP exercises  5) Dental: Edentulous.    6) Thyroid function:   today's lab result is pending Lab Results  Component Value Date   TSH 1.645 04/15/2019    7) Other: F/u w/ ENT (patient will reschedule since he missed this month's appointment), with me in 58mo with TSH; sooner if needed.  I let him know that if he needs any more letters to help him find work I am happy to vouch for the fact that he is in remission with a good  chance of being cured if he has no evidence of disease by 2 years post treatment.   On date of service in total I spent 25 minutes on this encounter  _____________________________________   Eppie Gibson, MD.

## 2020-03-12 ENCOUNTER — Telehealth: Payer: Self-pay | Admitting: *Deleted

## 2020-03-12 LAB — TSH: TSH: 1.357 u[IU]/mL (ref 0.320–4.118)

## 2020-03-12 NOTE — Telephone Encounter (Signed)
CALLED PATIENT TO INFORM OF FU WITH DR. Isidore Moos ON 11-09-20 @ 2 PM, LVM FOR A RETURN CALL

## 2020-03-20 ENCOUNTER — Other Ambulatory Visit: Payer: Self-pay

## 2020-03-20 ENCOUNTER — Encounter: Payer: Self-pay | Admitting: Family Medicine

## 2020-03-20 ENCOUNTER — Ambulatory Visit (INDEPENDENT_AMBULATORY_CARE_PROVIDER_SITE_OTHER): Payer: Medicaid Other | Admitting: Family Medicine

## 2020-03-20 VITALS — BP 143/85 | HR 86 | Temp 98.7°F | Ht 67.0 in | Wt 157.8 lb

## 2020-03-20 DIAGNOSIS — G629 Polyneuropathy, unspecified: Secondary | ICD-10-CM | POA: Diagnosis not present

## 2020-03-20 DIAGNOSIS — I10 Essential (primary) hypertension: Secondary | ICD-10-CM

## 2020-03-20 DIAGNOSIS — Z09 Encounter for follow-up examination after completed treatment for conditions other than malignant neoplasm: Secondary | ICD-10-CM

## 2020-03-20 DIAGNOSIS — R531 Weakness: Secondary | ICD-10-CM | POA: Insufficient documentation

## 2020-03-20 DIAGNOSIS — K529 Noninfective gastroenteritis and colitis, unspecified: Secondary | ICD-10-CM | POA: Diagnosis not present

## 2020-03-20 DIAGNOSIS — C090 Malignant neoplasm of tonsillar fossa: Secondary | ICD-10-CM

## 2020-03-20 DIAGNOSIS — Z0000 Encounter for general adult medical examination without abnormal findings: Secondary | ICD-10-CM

## 2020-03-20 DIAGNOSIS — Z8673 Personal history of transient ischemic attack (TIA), and cerebral infarction without residual deficits: Secondary | ICD-10-CM | POA: Insufficient documentation

## 2020-03-20 DIAGNOSIS — Z9889 Other specified postprocedural states: Secondary | ICD-10-CM

## 2020-03-20 LAB — GLUCOSE, POCT (MANUAL RESULT ENTRY): POC Glucose: 100 mg/dl — AB (ref 70–99)

## 2020-03-20 LAB — POCT GLYCOSYLATED HEMOGLOBIN (HGB A1C): Hemoglobin A1C: 4.6 % (ref 4.0–5.6)
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
## 2020-07-26 ENCOUNTER — Other Ambulatory Visit: Payer: Self-pay | Admitting: Family Medicine

## 2020-07-26 DIAGNOSIS — I10 Essential (primary) hypertension: Secondary | ICD-10-CM

## 2020-07-26 DIAGNOSIS — I160 Hypertensive urgency: Secondary | ICD-10-CM

## 2020-08-20 ENCOUNTER — Other Ambulatory Visit: Payer: Self-pay | Admitting: Family Medicine

## 2020-08-20 DIAGNOSIS — G629 Polyneuropathy, unspecified: Secondary | ICD-10-CM

## 2020-08-22 ENCOUNTER — Other Ambulatory Visit: Payer: Self-pay | Admitting: Family Medicine

## 2020-08-22 ENCOUNTER — Ambulatory Visit: Payer: Medicaid Other | Admitting: Podiatry

## 2020-08-22 ENCOUNTER — Other Ambulatory Visit: Payer: Self-pay

## 2020-08-22 DIAGNOSIS — B351 Tinea unguium: Secondary | ICD-10-CM | POA: Diagnosis not present

## 2020-08-22 DIAGNOSIS — M79675 Pain in left toe(s): Secondary | ICD-10-CM

## 2020-08-22 DIAGNOSIS — M79674 Pain in right toe(s): Secondary | ICD-10-CM | POA: Diagnosis not present

## 2020-08-22 DIAGNOSIS — G629 Polyneuropathy, unspecified: Secondary | ICD-10-CM

## 2020-08-22 DIAGNOSIS — Z79899 Other long term (current) drug therapy: Secondary | ICD-10-CM | POA: Diagnosis not present

## 2020-08-22 LAB — HEPATIC FUNCTION PANEL
AG Ratio: 1.6 (calc) (ref 1.0–2.5)
ALT: 56 U/L — ABNORMAL HIGH (ref 9–46)
AST: 193 U/L — ABNORMAL HIGH (ref 10–35)
Albumin: 3.9 g/dL (ref 3.6–5.1)
Alkaline phosphatase (APISO): 178 U/L — ABNORMAL HIGH (ref 35–144)
Bilirubin, Direct: 0.7 mg/dL — ABNORMAL HIGH (ref 0.0–0.2)
Globulin: 2.5 g/dL (calc) (ref 1.9–3.7)
Indirect Bilirubin: 1.1 mg/dL (calc) (ref 0.2–1.2)
Total Bilirubin: 1.8 mg/dL — ABNORMAL HIGH (ref 0.2–1.2)
Total Protein: 6.4 g/dL (ref 6.1–8.1)
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
## 2020-08-23 ENCOUNTER — Other Ambulatory Visit: Payer: Self-pay | Admitting: Family Medicine

## 2020-08-23 DIAGNOSIS — G629 Polyneuropathy, unspecified: Secondary | ICD-10-CM

## 2020-08-23 NOTE — Telephone Encounter (Signed)
rx refill gabapentin 300 mg

## 2020-09-19 ENCOUNTER — Ambulatory Visit: Payer: Self-pay | Admitting: Family Medicine

## 2020-10-23 ENCOUNTER — Other Ambulatory Visit: Payer: Self-pay | Admitting: Nurse Practitioner

## 2020-10-23 ENCOUNTER — Telehealth: Payer: Self-pay | Admitting: Family Medicine

## 2020-10-23 ENCOUNTER — Other Ambulatory Visit: Payer: Self-pay | Admitting: Family Medicine

## 2020-10-23 NOTE — Telephone Encounter (Signed)
It looks like NP Clovis Riley will be sending this over

## 2020-10-29 DIAGNOSIS — E559 Vitamin D deficiency, unspecified: Secondary | ICD-10-CM

## 2020-10-29 DIAGNOSIS — E876 Hypokalemia: Secondary | ICD-10-CM

## 2020-10-29 DIAGNOSIS — R748 Abnormal levels of other serum enzymes: Secondary | ICD-10-CM

## 2020-10-29 DIAGNOSIS — D696 Thrombocytopenia, unspecified: Secondary | ICD-10-CM

## 2020-10-29 HISTORY — DX: Vitamin D deficiency, unspecified: E55.9

## 2020-10-29 HISTORY — DX: Abnormal levels of other serum enzymes: R74.8

## 2020-10-29 HISTORY — DX: Thrombocytopenia, unspecified: D69.6

## 2020-10-29 HISTORY — DX: Hypokalemia: E87.6

## 2020-11-07 ENCOUNTER — Other Ambulatory Visit: Payer: Self-pay

## 2020-11-07 ENCOUNTER — Telehealth: Payer: Self-pay | Admitting: Family Medicine

## 2020-11-07 ENCOUNTER — Encounter: Payer: Self-pay | Admitting: Family Medicine

## 2020-11-07 ENCOUNTER — Ambulatory Visit (INDEPENDENT_AMBULATORY_CARE_PROVIDER_SITE_OTHER): Payer: Medicaid Other | Admitting: Family Medicine

## 2020-11-07 VITALS — BP 151/95 | HR 99 | Temp 98.2°F | Ht 67.0 in | Wt 142.8 lb

## 2020-11-07 DIAGNOSIS — R531 Weakness: Secondary | ICD-10-CM

## 2020-11-07 DIAGNOSIS — Z85818 Personal history of malignant neoplasm of other sites of lip, oral cavity, and pharynx: Secondary | ICD-10-CM

## 2020-11-07 DIAGNOSIS — I10 Essential (primary) hypertension: Secondary | ICD-10-CM | POA: Diagnosis not present

## 2020-11-07 DIAGNOSIS — Z2821 Immunization not carried out because of patient refusal: Secondary | ICD-10-CM | POA: Diagnosis not present

## 2020-11-07 DIAGNOSIS — Z720 Tobacco use: Secondary | ICD-10-CM | POA: Diagnosis not present

## 2020-11-07 DIAGNOSIS — Z0000 Encounter for general adult medical examination without abnormal findings: Secondary | ICD-10-CM | POA: Diagnosis not present

## 2020-11-07 DIAGNOSIS — K219 Gastro-esophageal reflux disease without esophagitis: Secondary | ICD-10-CM | POA: Diagnosis not present

## 2020-11-07 DIAGNOSIS — Z09 Encounter for follow-up examination after completed treatment for conditions other than malignant neoplasm: Secondary | ICD-10-CM

## 2020-11-07 DIAGNOSIS — Z8673 Personal history of transient ischemic attack (TIA), and cerebral infarction without residual deficits: Secondary | ICD-10-CM

## 2020-11-07 DIAGNOSIS — G629 Polyneuropathy, unspecified: Secondary | ICD-10-CM | POA: Diagnosis not present

## 2020-11-07 DIAGNOSIS — R0989 Other specified symptoms and signs involving the circulatory and respiratory systems: Secondary | ICD-10-CM | POA: Diagnosis not present

## 2020-11-07 DIAGNOSIS — I160 Hypertensive urgency: Secondary | ICD-10-CM

## 2020-11-07 DIAGNOSIS — E785 Hyperlipidemia, unspecified: Secondary | ICD-10-CM | POA: Diagnosis not present

## 2020-11-07 DIAGNOSIS — R1310 Dysphagia, unspecified: Secondary | ICD-10-CM

## 2020-11-07 DIAGNOSIS — Z28310 Unvaccinated for covid-19: Secondary | ICD-10-CM

## 2020-11-07 DIAGNOSIS — Z760 Encounter for issue of repeat prescription: Secondary | ICD-10-CM

## 2020-11-07 MED ORDER — LISINOPRIL 10 MG PO TABS: 10.0000 mg | ORAL_TABLET | Freq: Every day | ORAL | 3 refills | Status: DC

## 2020-11-07 MED ORDER — ATORVASTATIN CALCIUM 40 MG PO TABS: 40.0000 mg | ORAL_TABLET | Freq: Every evening | ORAL | 3 refills | Status: DC

## 2020-11-07 MED ORDER — AMLODIPINE BESYLATE 5 MG PO TABS: 5.0000 mg | ORAL_TABLET | Freq: Every day | ORAL | 3 refills | Status: DC

## 2020-11-07 MED ORDER — OMEPRAZOLE 20 MG PO CPDR: 20.0000 mg | DELAYED_RELEASE_CAPSULE | Freq: Every day | ORAL | 3 refills | Status: DC

## 2020-11-07 MED ORDER — AMOXICILLIN-POT CLAVULANATE 875-125 MG PO TABS: 1.0000 | ORAL_TABLET | Freq: Two times a day (BID) | ORAL | 0 refills | Status: AC

## 2020-11-07 MED ORDER — GABAPENTIN 300 MG PO CAPS: 300.0000 mg | ORAL_CAPSULE | Freq: Two times a day (BID) | ORAL | 3 refills | Status: DC
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
## 2020-11-08 LAB — COMPREHENSIVE METABOLIC PANEL
ALT: 44 IU/L (ref 0–44)
AST: 188 IU/L — ABNORMAL HIGH (ref 0–40)
Albumin/Globulin Ratio: 1.2 (ref 1.2–2.2)
Albumin: 4.1 g/dL (ref 3.8–4.9)
Alkaline Phosphatase: 196 IU/L — ABNORMAL HIGH (ref 44–121)
BUN/Creatinine Ratio: 8 — ABNORMAL LOW (ref 9–20)
BUN: 8 mg/dL (ref 6–24)
Bilirubin Total: 1.2 mg/dL (ref 0.0–1.2)
CO2: 24 mmol/L (ref 20–29)
Calcium: 9.1 mg/dL (ref 8.7–10.2)
Chloride: 96 mmol/L (ref 96–106)
Creatinine, Ser: 1.06 mg/dL (ref 0.76–1.27)
GFR calc Af Amer: 92 mL/min/{1.73_m2} (ref >59)
GFR calc non Af Amer: 79 mL/min/{1.73_m2} (ref >59)
Globulin, Total: 3.4 g/dL (ref 1.5–4.5)
Glucose: 78 mg/dL (ref 65–99)
Potassium: 4.2 mmol/L (ref 3.5–5.2)
Sodium: 137 mmol/L (ref 134–144)
Total Protein: 7.5 g/dL (ref 6.0–8.5)

## 2020-11-08 LAB — CBC WITH DIFFERENTIAL/PLATELET
Basophils Absolute: 0.1 10*3/uL (ref 0.0–0.2)
Basos: 2 %
EOS (ABSOLUTE): 0.1 10*3/uL (ref 0.0–0.4)
Eos: 2 %
Hematocrit: 37.5 % (ref 37.5–51.0)
Hemoglobin: 13.1 g/dL (ref 13.0–17.7)
Immature Grans (Abs): 0 10*3/uL (ref 0.0–0.1)
Immature Granulocytes: 0 %
Lymphocytes Absolute: 1.1 10*3/uL (ref 0.7–3.1)
Lymphs: 28 %
MCH: 36.9 pg — ABNORMAL HIGH (ref 26.6–33.0)
MCHC: 34.9 g/dL (ref 31.5–35.7)
MCV: 106 fL — ABNORMAL HIGH (ref 79–97)
Monocytes Absolute: 0.5 10*3/uL (ref 0.1–0.9)
Monocytes: 12 %
Neutrophils Absolute: 2.3 10*3/uL (ref 1.4–7.0)
Neutrophils: 56 %
Platelets: 87 10*3/uL — CL (ref 150–450)
RBC: 3.55 x10E6/uL — ABNORMAL LOW (ref 4.14–5.80)
RDW: 12.3 % (ref 11.6–15.4)
WBC: 4 10*3/uL (ref 3.4–10.8)

## 2020-11-08 LAB — PSA: Prostate Specific Ag, Serum: 3 ng/mL (ref 0.0–4.0)

## 2020-11-08 LAB — VITAMIN B12: Vitamin B-12: 957 pg/mL (ref 232–1245)

## 2020-11-08 LAB — LIPID PANEL
Chol/HDL Ratio: 1.5 ratio (ref 0.0–5.0)
Cholesterol, Total: 174 mg/dL (ref 100–199)
HDL: 113 mg/dL (ref >39)
LDL Chol Calc (NIH): 47 mg/dL (ref 0–99)
Triglycerides: 77 mg/dL (ref 0–149)
VLDL Cholesterol Cal: 14 mg/dL (ref 5–40)

## 2020-11-08 LAB — TSH: TSH: 5.58 u[IU]/mL — ABNORMAL HIGH (ref 0.450–4.500)

## 2020-11-08 LAB — VITAMIN D 25 HYDROXY (VIT D DEFICIENCY, FRACTURES): Vit D, 25-Hydroxy: 9.4 ng/mL — ABNORMAL LOW (ref 30.0–100.0)

## 2020-11-08 NOTE — Progress Notes (Signed)
Brian Dixon presents for follow up of radiation completed on 01/12/2019 to his oropharynx and tongue.   Pain issues, if any: Patient reports mild neck/throat pain, but his main concern is the discomfort from his neuropathy in his lower legs (reports is makes it difficult to walk) Using a feeding tube?: N/A Weight changes, if any:  Wt Readings from Last 3 Encounters:  11/09/20 143 lb 9.6 oz (65.1 kg)  11/07/20 142 lb 12.8 oz (64.8 kg)  03/20/20 157 lb 12.8 oz (71.6 kg)   Swallowing issues, if any: Yes--he cannot tolerate solid foods. His diet continues to be limited to soups and mashed foods. He has to ensure he swallows everything on the left side of his mouth otherwise it won't go down his throat smoothly. Reports he cannot tolerated drinking water/juices because they burn his throat--he mainly sticks to gatorade and body armor sports drinks Smoking or chewing tobacco? Patient reports he is trying to quit smoking. He is down to 3 cigarettes a day Using fluoride trays daily? N/A Last ENT visit was on: Not since 06/27/2019 when he saw Dr. Heath Lark Other notable issues, if any: Continues to deal with dry mouth and thick saliva. He reports he has difficulty opening his mouth in the mornings, and has to spend quite a bit of time massaging/exercising. He states the right side of his throat is still not functional, and he still struggles to use his left hand/forearm from muscle graft surgery.   Vitals:   11/09/20 1348  BP: 136/89  Pulse: 78  Resp: 18  Temp: 97.6 F (36.4 C)  SpO2: 100%

## 2020-11-08 NOTE — Telephone Encounter (Signed)
Sent to provider 

## 2020-11-09 ENCOUNTER — Telehealth: Payer: Self-pay | Admitting: Medical

## 2020-11-09 ENCOUNTER — Other Ambulatory Visit: Payer: Self-pay

## 2020-11-09 ENCOUNTER — Telehealth: Payer: Self-pay | Admitting: Family Medicine

## 2020-11-09 ENCOUNTER — Inpatient Hospital Stay: Payer: Medicaid Other | Attending: Radiation Oncology

## 2020-11-09 ENCOUNTER — Ambulatory Visit
Admission: RE | Admit: 2020-11-09 | Discharge: 2020-11-09 | Disposition: A | Discharge status: Home / Self Care | Payer: Medicaid Other | Source: Ambulatory Visit | Attending: Radiation Oncology | Admitting: Radiation Oncology

## 2020-11-09 ENCOUNTER — Encounter: Payer: Self-pay | Admitting: Family Medicine

## 2020-11-09 ENCOUNTER — Other Ambulatory Visit: Payer: Self-pay | Admitting: Family Medicine

## 2020-11-09 VITALS — BP 136/89 | HR 78 | Temp 97.6°F | Resp 18 | Ht 67.0 in | Wt 143.6 lb

## 2020-11-09 DIAGNOSIS — C090 Malignant neoplasm of tonsillar fossa: Secondary | ICD-10-CM | POA: Diagnosis not present

## 2020-11-09 DIAGNOSIS — E876 Hypokalemia: Secondary | ICD-10-CM

## 2020-11-09 DIAGNOSIS — Z923 Personal history of irradiation: Secondary | ICD-10-CM | POA: Insufficient documentation

## 2020-11-09 DIAGNOSIS — Z23 Encounter for immunization: Secondary | ICD-10-CM | POA: Insufficient documentation

## 2020-11-09 DIAGNOSIS — Z08 Encounter for follow-up examination after completed treatment for malignant neoplasm: Secondary | ICD-10-CM | POA: Diagnosis not present

## 2020-11-09 DIAGNOSIS — R1310 Dysphagia, unspecified: Secondary | ICD-10-CM | POA: Diagnosis not present

## 2020-11-09 DIAGNOSIS — Z85818 Personal history of malignant neoplasm of other sites of lip, oral cavity, and pharynx: Secondary | ICD-10-CM | POA: Diagnosis not present

## 2020-11-09 DIAGNOSIS — Z85819 Personal history of malignant neoplasm of unspecified site of lip, oral cavity, and pharynx: Secondary | ICD-10-CM | POA: Insufficient documentation

## 2020-11-09 DIAGNOSIS — E559 Vitamin D deficiency, unspecified: Secondary | ICD-10-CM

## 2020-11-09 MED ORDER — VITAMIN D (ERGOCALCIFEROL) 1.25 MG (50000 UNIT) PO CAPS: 50000.0000 [IU] | ORAL_CAPSULE | ORAL | 6 refills | Status: DC

## 2020-11-09 MED ORDER — POTASSIUM CHLORIDE CRYS ER 20 MEQ PO TBCR: 20.0000 meq | EXTENDED_RELEASE_TABLET | Freq: Every day | ORAL | 3 refills | Status: DC

## 2020-11-09 NOTE — Progress Notes (Signed)
   Covid-19 Vaccination Clinic  Name:  Brian Dixon    MRN: [id number] DOB: 12/02/1966  11/09/2020  [name] was observed post Covid-19 immunization for 15 minutes without incident. He was provided with Vaccine Information Sheet and instruction to access the V-Safe system.   [name] was instructed to call 911 with any severe reactions post vaccine: Marland Kitchen Difficulty breathing  . Swelling of face and throat  . A fast heartbeat  . A bad rash all over body  . Dizziness and weakness   Immunizations Administered    Name Date Dose VIS Date Route   Pfizer COVID-19 Vaccine 11/09/2020  2:49 PM 0.3 mL 10/17/2020 Intramuscular   Manufacturer: [place]   Lot: [id number]   NDC: [address]-6

## 2020-11-09 NOTE — Telephone Encounter (Signed)
Attempted to contact patient to review abnormal lab results. Message left for patient to contact office to review.

## 2020-11-09 NOTE — Progress Notes (Signed)
Oncology Nurse Navigator Documentation  Per patient's 11/09/20 post-treatment follow-up with Dr. Isidore Moos, sent fax to [facility] ENT Scheduling with request Mr. Savant be contacted and scheduled for routine post-RT follow-up in 6 months with Dr. Redmond Baseman.  Notification of successful fax transmission received.  Harlow Asa RN, BSN, OCN Head & Neck Oncology Nurse [place] [facility] Phone # [phone]  Fax # [phone]

## 2020-11-09 NOTE — Telephone Encounter (Signed)
Scheduled appt per 11/12 sch msg - mailed reminder letter with appt date and time

## 2020-11-09 NOTE — Telephone Encounter (Signed)
Lab results discussed with patient today. Verbalized understanding.

## 2020-11-12 ENCOUNTER — Telehealth: Payer: Self-pay | Admitting: *Deleted

## 2020-11-12 NOTE — Telephone Encounter (Signed)
CALLED PATIENT TO ASK IF HE WANTS TO SEE NUTRITIONIST TOMORROW (11/13/20), SPOKE WITH PATIENT'S MOTHER AND SHE HAD ME TO BLOCK 2:30 PM ON 11-13-20, SHE SAID THAT SHE WOULD ASK HIM AND CONFIRM APPT. FOR TOMORROW, NOTIFIED [place]

## 2020-11-13 ENCOUNTER — Encounter: Payer: Self-pay | Admitting: Radiation Oncology

## 2020-11-13 ENCOUNTER — Inpatient Hospital Stay: Payer: Medicaid Other | Admitting: Nutrition

## 2020-11-13 NOTE — Progress Notes (Signed)
Radiation Oncology         [phone] ________________________________  Name: Brian Dixon MRN: [id number]  Date: 11/09/2020  DOB: [birth year]  Follow-Up in person  Outpatient  CC: Brian Glatter, FNP  Brian Quitter, MD  Diagnosis and Prior Radiotherapy:        ICD-10-CM   1. 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[place])  C09.0 Amb Referral to Survivorship Long term    Ambulatory referral to Nutrition and Diabetic Education    Cancer Staging 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[place]) Staging form: Pharynx - P16 Negative Oropharynx, AJCC 8th Edition - Clinical stage from 09/07/2018: Stage III (cT3, cN0, cM0, p16-) - Signed by Brian Gibson, MD on 09/07/2018 - Pathologic: Stage III (pT3, pN0, cM0, p16-) - Signed by Brian Gibson, MD on 11/05/2018  Radiation treatment dates:   11/29/2018-01/12/2019  Site/dose:     Oropharynx and tongue / total dose, 60Gy in 30 fractions  CHIEF COMPLAINT:   Here for follow-up of throat cancer  NARRATIVE:  Brian Dixon presents for follow up of radiation completed on 01/12/2019 to his oropharynx and tongue.   Pain issues, if any: Patient reports mild neck/throat pain, but his main concern is the discomfort from his neuropathy in his lower legs (reports is makes it difficult to walk) Using a feeding tube?: N/A Weight changes, if any:  Wt Readings from Last 3 Encounters:  11/09/20 143 lb 9.6 oz (65.1 kg)  11/07/20 142 lb 12.8 oz (64.8 kg)  03/20/20 157 lb 12.8 oz (71.6 kg)   Swallowing issues, if any: Yes--he cannot tolerate solid foods. His diet continues to be limited to soups and mashed foods. He has to ensure he swallows everything on the left side of his mouth otherwise it won't go down his throat smoothly. Reports he cannot tolerated drinking water/juices because they burn his throat--he mainly sticks to gatorade and body armor sports drinks Smoking or chewing tobacco? Patient reports he is trying to quit smoking. He is down to 3  cigarettes a day Using fluoride trays daily? N/A Last ENT visit was on: Not since 06/27/2019 when he saw Dr. Heath Dixon Other notable issues, if any: Continues to deal with dry mouth and thick saliva. He reports he has difficulty opening his mouth in the mornings, and has to spend quite a bit of time massaging/exercising. He states the right side of his throat is still not functional, and he still struggles to use his left hand/forearm from muscle graft surgery.   He is coping with his father's medical decline following his father's stroke.  This has been very difficult for the family.  Vitals:   11/09/20 1348  BP: 136/89  Pulse: 78  Resp: 18  Temp: 97.6 F (36.4 C)  SpO2: 100%    ALLERGIES:  is allergic to shellfish allergy.  Meds: Current Outpatient Medications  Medication Sig Dispense Refill  . amLODipine (NORVASC) 5 MG tablet Take 1 tablet (5 mg total) by mouth daily. 90 tablet 3  . amoxicillin-clavulanate (AUGMENTIN) 875-125 MG tablet Take 1 tablet by mouth 2 (two) times daily for 10 days. 20 tablet 0  . antiseptic oral rinse (BIOTENE) LIQD 15 mLs by Mouth Rinse route as needed for dry mouth.    Marland Kitchen atorvastatin (LIPITOR) 40 MG tablet Take 1 tablet (40 mg total) by mouth every evening. 90 tablet 3  . Dentifrices (BIOTENE DRY MOUTH GENTLE) PSTE Place 1 application onto teeth in the morning and at bedtime.    Marland Kitchen dextromethorphan-guaiFENesin [facility]  DM) 30-600 MG 12hr tablet Take 1 tablet by mouth at bedtime.    . gabapentin (NEURONTIN) 300 MG capsule Take 1 capsule (300 mg total) by mouth 2 (two) times daily. 180 capsule 3  . Glycerin-Hypromellose-PEG 400 (DRY EYE RELIEF DROPS) 0.2-0.2-1 % SOLN Place 1 drop into both eyes 3 (three) times daily as needed (dry/irritated eyes.).    Marland Kitchen lisinopril (ZESTRIL) 10 MG tablet Take 1 tablet (10 mg total) by mouth daily. 90 tablet 3  . Menthol-Camphor (ICY HOT ADVANCED RELIEF) 16-11 % CREA Apply 1 application topically 3 (three) times daily as  needed (pain).    Marland Kitchen omeprazole (PRILOSEC) 20 MG capsule Take 1 capsule (20 mg total) by mouth daily. 90 capsule 3  . potassium chloride SA (KLOR-CON) 20 MEQ tablet Take 1 tablet (20 mEq total) by mouth daily. 30 tablet 3  . protein supplement shake (PREMIER PROTEIN) LIQD Take 8 oz by mouth 2 (two) times daily between meals.    . thiamine (VITAMIN B-1) 100 MG tablet Take 1 tablet (100 mg total) by mouth daily. 30 tablet 0  . Vitamin D, Ergocalciferol, (DRISDOL) 1.25 MG (50000 UNIT) CAPS capsule Take 1 capsule (50,000 Units total) by mouth every 7 (seven) days. 5 capsule 6   No current facility-administered medications for this encounter.    Physical Findings:   Wt Readings from Last 3 Encounters:  11/09/20 143 lb 9.6 oz (65.1 kg)  11/07/20 142 lb 12.8 oz (64.8 kg)  03/20/20 157 lb 12.8 oz (71.6 kg)    height is 5\' 7"  (1.702 m) and weight is 143 lb 9.6 oz (65.1 kg). His temperature is 97.6 F (36.4 C). His blood pressure is 136/89 and his pulse is 78. His respiration is 18 and oxygen saturation is 100%. .  General: Alert and oriented, in no acute distress, thin HEENT: Head is normocephalic. Extraocular movements are intact. Oropharynx and mouth are clear.   No thrush Neck: Neck is supple, no palpable cervical or supraclavicular lymphadenopathy. Heart:  Regular in rhythm.  No murmurs Chest is clear to auscultation bilaterally Lymphatics: see Neck Exam Skin: No concerning lesions.    Lab Findings: Lab Results  Component Value Date   WBC 4.0 11/07/2020   HGB 13.1 11/07/2020   HCT 37.5 11/07/2020   MCV 106 (H) 11/07/2020   PLT 87 (LL) 11/07/2020    Lab Results  Component Value Date   TSH 5.580 (H) 11/07/2020    Radiographic Findings: No results found.  Impression/Plan:    1) Head and Neck Cancer Status: in remission, NED, however: This patient is struggling almost 2 years after completing treatment.  No sign of persistent cancer but his intake is severely limited because of  chronic dysphagia.   2) Nutritional Status: losing weight -  I've asked Brian Dixon to see him as soon as possible to get him back up to speed with nutritional strategies.  If needed we can reinsert a feeding tube but I am hoping that with some extra coaching he may be able to increase his intake and regain weight.  PEG tube: removed 05/2019  3) Risk Factors: The patient has been educated about risk factors including alcohol and tobacco abuse; they understand that avoidance of alcohol and tobacco is important to prevent recurrences as well as other cancers. Unfortunately, he is still smoking.  Urged again to cut back.  4) Swallowing:  Message sent to speech-language pathology re: dysphagia and recommendations for studies / re-referrals  5) Dental: Edentulous.  6) Thyroid function:   Mildly elevated TSH drawn by Kathe Becton, PCP; I sent a message to her to make sure she is aware (consider Free T4) Lab Results  Component Value Date   TSH 5.580 (H) 11/07/2020   7) He has been lost to follow-up with his ENT.  He does not want to go to [facility] anymore but he did see Dr. Redmond Baseman as well in the past and is willing to see him again.  Please refer him for 85-month follow-up.  8) Survivorship: While the patient is in remission he has issues with his quality of life, both related to his cancer diagnosis and past treatment as well as other comorbidities.  I have referred him to survivorship in 73mo and sent a note to [employer] about his challenges.  9) Follow-up with me in Sept 2022.  10) We discussed measures to reduce the risk of infection during the COVID-19 pandemic.  After our discussion he is now agreeable to getting vaccinated.  He will get his first [place] shot today at the cancer center.  On date of service, in total, I spent 30 minutes on this encounter. Patient was seen in person. _____________________________________   Brian Gibson, MD.

## 2020-11-14 ENCOUNTER — Other Ambulatory Visit: Payer: Self-pay

## 2020-11-14 DIAGNOSIS — C090 Malignant neoplasm of tonsillar fossa: Secondary | ICD-10-CM

## 2020-11-15 NOTE — Progress Notes (Signed)
Oncology Nurse Navigator Documentation  I called and left a voice mail with Mr. Hillery. I advised him of Dr. Pearlie Oyster and SLP's recommendation for Mr. Tapley to have a swallowing study to evaluate his swallowing. I informed him that he should be receiving a phone call to get the swallowing study scheduled. I provided him with my direct contact information to call me if he had any questions or concerns.  Harlow Asa RN, BSN, OCN Head & Neck Oncology Nurse [place] at [facility] Phone # [phone]  Fax # [phone]

## 2020-11-21 ENCOUNTER — Inpatient Hospital Stay: Payer: Medicaid Other | Admitting: Nutrition

## 2020-11-21 ENCOUNTER — Encounter: Payer: Self-pay | Admitting: Nutrition

## 2020-11-21 NOTE — Progress Notes (Signed)
Patient did not show up for nutrition appointment. 

## 2020-11-27 ENCOUNTER — Other Ambulatory Visit: Payer: Self-pay

## 2020-11-27 DIAGNOSIS — C090 Malignant neoplasm of tonsillar fossa: Secondary | ICD-10-CM

## 2020-11-27 NOTE — Progress Notes (Signed)
Oncology Nurse Navigator Documentation  I spoke with Brian Dixon regarding a MBSS swallowing study ordered by Dr. Isidore Moos. I informed him that it has been scheduled for 12/10 at [facility]. He wrote down the time, date, and place while I was on the phone and verbalized understanding of the reasons for the appointment. He plans to attend the appointment. He has my direct contact information for any questions or concerns that may arise concerning the appointment.   Harlow Asa RN, BSN, OCN Head & Neck Oncology Nurse [place] at [facility] Phone # [phone]  Fax # [phone]

## 2020-11-28 ENCOUNTER — Ambulatory Visit: Payer: Medicaid Other | Admitting: Podiatry

## 2020-11-30 ENCOUNTER — Inpatient Hospital Stay: Payer: Medicaid Other | Attending: Medical

## 2020-11-30 ENCOUNTER — Telehealth: Payer: Self-pay | Admitting: Medical

## 2020-11-30 DIAGNOSIS — Z23 Encounter for immunization: Secondary | ICD-10-CM | POA: Insufficient documentation

## 2020-11-30 DIAGNOSIS — C090 Malignant neoplasm of tonsillar fossa: Secondary | ICD-10-CM | POA: Insufficient documentation

## 2020-11-30 NOTE — Telephone Encounter (Signed)
Called pt per 12/3 sch msg - no answer. Left message for patient to call back to reschedule appt.

## 2020-12-06 ENCOUNTER — Telehealth: Payer: Self-pay

## 2020-12-06 NOTE — Telephone Encounter (Signed)
Patient called and left a VM asking to reschedule his 2nd COVID shot since he had to miss previous appointment due to family emergency.   High priority scheduling message sent to see if patient could be added to vaccine clinic tomorrow after his swallow study. Saw patient added to schedule for 14:45.  Returned patients call with update, and informed him to check in at main desk of cancer center after he completes procedure. No other needs identified at this time. Patient knows to call me back should he have any other questions/concerns

## 2020-12-07 ENCOUNTER — Ambulatory Visit (HOSPITAL_COMMUNITY)
Admission: RE | Admit: 2020-12-07 | Discharge: 2020-12-07 | Disposition: A | Discharge status: Home / Self Care | Payer: Medicaid Other | Source: Ambulatory Visit | Attending: Radiation Oncology | Admitting: Radiation Oncology

## 2020-12-07 ENCOUNTER — Inpatient Hospital Stay: Payer: Medicaid Other

## 2020-12-07 ENCOUNTER — Other Ambulatory Visit: Payer: Self-pay

## 2020-12-07 DIAGNOSIS — Z23 Encounter for immunization: Secondary | ICD-10-CM | POA: Diagnosis present

## 2020-12-07 DIAGNOSIS — C090 Malignant neoplasm of tonsillar fossa: Secondary | ICD-10-CM | POA: Diagnosis present

## 2020-12-07 NOTE — Patient Instructions (Signed)
   Covid-19 Vaccination Clinic  Name:  Brian Dixon    MRN: [id number] DOB: 08-Jan-1966  12/07/2020  [name] was observed post Covid-19 immunization for 15 minutes without incident. He was provided with Vaccine Information Sheet and instruction to access the V-Safe system.   [name] was instructed to call 911 with any severe reactions post vaccine: Marland Kitchen Difficulty breathing  . Swelling of face and throat  . A fast heartbeat  . A bad rash all over body  . Dizziness and weakness

## 2020-12-11 ENCOUNTER — Other Ambulatory Visit: Payer: Self-pay | Admitting: Family Medicine

## 2020-12-11 DIAGNOSIS — I10 Essential (primary) hypertension: Secondary | ICD-10-CM

## 2020-12-11 DIAGNOSIS — Z760 Encounter for issue of repeat prescription: Secondary | ICD-10-CM

## 2020-12-11 DIAGNOSIS — I160 Hypertensive urgency: Secondary | ICD-10-CM

## 2020-12-14 ENCOUNTER — Telehealth: Payer: Self-pay

## 2020-12-14 NOTE — Telephone Encounter (Signed)
Scheduled appt per 12/17 sch msg - pt is aware of appt date and time   

## 2021-01-08 ENCOUNTER — Inpatient Hospital Stay: Payer: Medicaid Other | Attending: Medical

## 2021-01-08 ENCOUNTER — Other Ambulatory Visit: Payer: Self-pay

## 2021-01-08 NOTE — Progress Notes (Signed)
Nutrition Assessment   Reason for Assessment:  Weight loss following treatment and PEG removal in 05/2019   ASSESSMENT:  55 year old male with oropharynx and tongue cancer (radiation completed on 01/12/2019).  Past medical history of HTN, colitis, stroke.   Met with patient in clinic.  Patient reports that he can't eat like normal anymore after surgery and radiation.  Reports that foods have to be covered in gravy or some kind of liquid.  Usually eats biscuit with gravy for breakfast, sometimes eggs but they have to mixed with gravy.  Eats lots of chicken noodle soup (canned).  Mother prepares chicken casserole with cream of mushroom soup in it that he can eat.  Patient can eat mashed potatoes or baked potato with butter, french cut green beans and creamed corn and fried okra.  Does not like cheese, milk, yogurt, dressings, mayo.  Likes jello. Can't eat applesauce because it burns.  Drinks gatorade and body armour drinks. Drinks premier protein shake (160 calories and 30 g protein).  Able to eat microwavable meals (salibury steak with gravy and mashed potatoes).  Patient reports ate well this am for breakfast but still hungry a few hours later.    Noted recent MBSS in 11/2020.  SLP recommends mech soft diet with thin liquids  Medications: reviewed   Labs: reviewed   Anthropometrics:   Height: 67 inches Weight: 147 lb 4 oz on RD scale in clinic (boots on today) 165 lb on 06/28/2019 (PEG tube removed) BMI: 22  11% weight loss in the last year and half   Estimated Energy Needs  Kcals: 2000-2300 Protein: 100-115 g Fluid: 2 L   NUTRITION DIAGNOSIS: Inadequate oral intake related to cancer related treatment side effects (dry mouth) as evidenced by 11% weight loss and limiting foods to certain textures   INTERVENTION:  Reviewed ways to add calories and protein to current eating pattern.   Encouraged eating q 2 hours for added calories and protein and to not ignore hunger but to eat.    Discussed foods higher in calorie to consume (ice cream vs jello) Samples given of ensure complete and boost plus (higher calorie) vs premier protein along with coupons. Stressed importance of consuming whole meal (protein food and 1-2 side items).   Contact information provided   MONITORING, EVALUATION, GOAL: weight trends, intake   Next Visit: Feb 15 in clinic for weight check  Brian Dixon, [place], [place] Registered Dietitian [phone] (mobile)

## 2021-01-25 ENCOUNTER — Telehealth: Payer: Self-pay | Admitting: Medical

## 2021-01-25 NOTE — Telephone Encounter (Signed)
Rescheduled 02/11 appointment to 02/10 due to pal, called patient and left a voicemail.

## 2021-02-05 ENCOUNTER — Ambulatory Visit: Payer: Self-pay | Admitting: Family Medicine

## 2021-02-07 ENCOUNTER — Inpatient Hospital Stay: Payer: Medicare HMO | Attending: Medical | Admitting: Medical

## 2021-02-08 ENCOUNTER — Encounter: Payer: Medicaid Other | Admitting: Medical

## 2021-02-12 ENCOUNTER — Other Ambulatory Visit: Payer: Self-pay | Admitting: Family Medicine

## 2021-02-12 ENCOUNTER — Inpatient Hospital Stay: Payer: Medicare HMO

## 2021-02-12 ENCOUNTER — Other Ambulatory Visit: Payer: Self-pay

## 2021-02-12 DIAGNOSIS — I160 Hypertensive urgency: Secondary | ICD-10-CM

## 2021-02-12 DIAGNOSIS — I10 Essential (primary) hypertension: Secondary | ICD-10-CM

## 2021-02-12 DIAGNOSIS — Z760 Encounter for issue of repeat prescription: Secondary | ICD-10-CM

## 2021-02-12 NOTE — Progress Notes (Signed)
Nutrition Follow-up:   Patient with oropharynx and tongue cancer (radiation completed in 01/12/2019).  Past medical history of HTN, colitis, stroke.  Met with patient in clinic today for weight check and follow-up.  Patient reports that his appetite is good. Eating biscuit and gravy most mornings sometimes will have egg and adds sausage to it.  Lunch yesterday was creamed corn, mashed potatoes and pot pie with sausage and gravy in it.  Supper last night was beef roast cooked in crockpot with potatoes and onions.  Drinking 3 of 350 calorie shakes per day since last visit with RD.  Sometimes will snack on vienna sausages, regular ice cream, fudge rounds.  Likes gatorade and body armour drinks.     Medications: reviewed  Labs: reviewed  Anthropometrics:   Weight today in RD office 147 lb 2 oz Stable from visit on 1/11 (147 lb 4 oz)  165 lb on 06/28/2019 (PEG tube removed)   NUTRITION DIAGNOSIS: Inadequate oral intake stable   INTERVENTION:  Patient to continue 350 calorie shake TID Add bedtime snack, consistently every night for added calories and protein.  Eats supper around 5-5:30pm and goes to bed around 11am. Continuing eating complete meals TID (protein food plus 1-2 side items) Patient has contact information    MONITORING, EVALUATION, GOAL: weight trends, intake   NEXT VISIT: March 29 in clinic for weight check  Cyniah Gossard B. Zenia Resides, [place], [place] Registered Dietitian [phone] (mobile)

## 2021-02-19 ENCOUNTER — Encounter: Payer: Self-pay | Admitting: Family Medicine

## 2021-02-19 ENCOUNTER — Ambulatory Visit (INDEPENDENT_AMBULATORY_CARE_PROVIDER_SITE_OTHER): Payer: Medicare HMO | Admitting: Family Medicine

## 2021-02-19 ENCOUNTER — Other Ambulatory Visit: Payer: Self-pay

## 2021-02-19 VITALS — BP 133/76 | HR 100 | Ht 67.0 in | Wt 147.0 lb

## 2021-02-19 DIAGNOSIS — R531 Weakness: Secondary | ICD-10-CM | POA: Diagnosis not present

## 2021-02-19 DIAGNOSIS — Z09 Encounter for follow-up examination after completed treatment for conditions other than malignant neoplasm: Secondary | ICD-10-CM | POA: Diagnosis not present

## 2021-02-19 DIAGNOSIS — L299 Pruritus, unspecified: Secondary | ICD-10-CM | POA: Diagnosis not present

## 2021-02-19 DIAGNOSIS — I10 Essential (primary) hypertension: Secondary | ICD-10-CM

## 2021-02-19 DIAGNOSIS — Z85818 Personal history of malignant neoplasm of other sites of lip, oral cavity, and pharynx: Secondary | ICD-10-CM

## 2021-02-19 DIAGNOSIS — Z8673 Personal history of transient ischemic attack (TIA), and cerebral infarction without residual deficits: Secondary | ICD-10-CM | POA: Diagnosis not present

## 2021-02-19 MED ORDER — HYDROXYZINE HCL 10 MG PO TABS: 10.0000 mg | ORAL_TABLET | Freq: Three times a day (TID) | ORAL | 3 refills | Status: DC | PRN

## 2021-02-19 MED ORDER — TRIAMCINOLONE ACETONIDE 0.1 % EX CREA: 1.0000 "application " | TOPICAL_CREAM | Freq: Two times a day (BID) | CUTANEOUS | 6 refills | Status: DC
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
## 2021-02-19 NOTE — Patient Instructions (Addendum)
Hydroxyzine capsules or tablets What is this medicine? HYDROXYZINE (hye [place] i zeen) is an antihistamine. This medicine is used to treat allergy symptoms. It is also used to treat anxiety and tension. This medicine can be used with other medicines to induce sleep before surgery. This medicine may be used for other purposes; ask your health care provider or pharmacist if you have questions. COMMON BRAND NAME(S): ANX, Atarax, Rezine, Vistaril What should I tell my health care provider before I take this medicine? They need to know if you have any of these conditions:  glaucoma  heart disease  history of irregular heartbeat  kidney disease  liver disease  lung or breathing disease, like asthma  stomach or intestine problems  thyroid disease  trouble passing urine  an unusual or allergic reaction to hydroxyzine, cetirizine, other medicines, foods, dyes or preservatives  pregnant or trying to get pregnant  breast-feeding How should I use this medicine? Take this medicine by mouth with a full glass of water. Follow the directions on the prescription label. You may take this medicine with food or on an empty stomach. Take your medicine at regular intervals. Do not take your medicine more often than directed. Talk to your pediatrician regarding the use of this medicine in children. Special care may be needed. While this drug may be prescribed for children as young as 73 years of age for selected conditions, precautions do apply. Patients over 36 years old may have a stronger reaction and need a smaller dose. Overdosage: If you think you have taken too much of this medicine contact a poison control center or emergency room at once. NOTE: This medicine is only for you. Do not share this medicine with others. What if I miss a dose? If you miss a dose, take it as soon as you can. If it is almost time for your next dose, take only that dose. Do not take double or extra doses. What may  interact with this medicine? Do not take this medicine with any of the following medications:  cisapride  dronedarone  pimozide  thioridazine This medicine may also interact with the following medications:  alcohol  antihistamines for allergy, cough, and cold  atropine  barbiturate medicines for sleep or seizures, like phenobarbital  certain antibiotics like erythromycin or clarithromycin  certain medicines for anxiety or sleep  certain medicines for bladder problems like oxybutynin, tolterodine  certain medicines for depression or psychotic disturbances  certain medicines for irregular heart beat  certain medicines for Parkinson's disease like benztropine, trihexyphenidyl  certain medicines for seizures like phenobarbital, primidone  certain medicines for stomach problems like dicyclomine, hyoscyamine  certain medicines for travel sickness like scopolamine  ipratropium  narcotic medicines for pain  other medicines that prolong the QT interval (an abnormal heart rhythm) like dofetilide This list may not describe all possible interactions. Give your health care provider a list of all the medicines, herbs, non-prescription drugs, or dietary supplements you use. Also tell them if you smoke, drink alcohol, or use illegal drugs. Some items may interact with your medicine. What should I watch for while using this medicine? Tell your doctor or health care professional if your symptoms do not improve. You may get drowsy or dizzy. Do not drive, use machinery, or do anything that needs mental alertness until you know how this medicine affects you. Do not stand or sit up quickly, especially if you are an older patient. This reduces the risk of dizzy or fainting spells. Alcohol may  interfere with the effect of this medicine. Avoid alcoholic drinks. Your mouth may get dry. Chewing sugarless gum or sucking hard candy, and drinking plenty of water may help. Contact your doctor if the  problem does not go away or is severe. This medicine may cause dry eyes and blurred vision. If you wear contact lenses you may feel some discomfort. Lubricating drops may help. See your eye doctor if the problem does not go away or is severe. If you are receiving skin tests for allergies, tell your doctor you are using this medicine. What side effects may I notice from receiving this medicine? Side effects that you should report to your doctor or health care professional as soon as possible:  allergic reactions like skin rash, itching or hives, swelling of the face, lips, or tongue  changes in vision  confusion  fast, irregular heartbeat  seizures  tremor  trouble passing urine or change in the amount of urine Side effects that usually do not require medical attention (report to your doctor or health care professional if they continue or are bothersome):  constipation  drowsiness  dry mouth  headache  tiredness This list may not describe all possible side effects. Call your doctor for medical advice about side effects. You may report side effects to FDA at 1-800-FDA-1088. Where should I keep my medicine? Keep out of the reach of children. Store at room temperature between 15 and 30 degrees C (59 and 86 degrees F). Keep container tightly closed. Throw away any unused medicine after the expiration date. NOTE: This sheet is a summary. It may not cover all possible information. If you have questions about this medicine, talk to your doctor, pharmacist, or health care provider.  2021 Elsevier/Gold Standard (2018-12-06 13:19:55) Pruritus Pruritus is an itchy feeling on the skin. One of the most common causes is dry skin, but many different things can cause itching. Most cases of itching do not require medical attention. Sometimes itchy skin can turn into a rash. Follow these instructions at home: Skin care  Apply moisturizing lotion to your skin as needed. Lotion that contains  petroleum jelly is best.  Take medicines or apply medicated creams only as told by your health care provider. This may include: ? Corticosteroid cream. ? Anti-itch lotions. ? Oral antihistamines.  Apply a cool, wet cloth (cool compress) to the affected areas.  Take baths with one of the following: ? Epsom salts. You can get these at your local pharmacy or grocery store. Follow the instructions on the packaging. ? Baking soda. Pour a small amount into the bath as told by your health care provider. ? Colloidal oatmeal. You can get this at your local pharmacy or grocery store. Follow the instructions on the packaging.  Apply baking soda paste to your skin. To make the paste, stir water into a small amount of baking soda until it reaches a paste-like consistency.  Do not scratch your skin.  Do not take hot showers or baths, which can make itching worse. A cool shower may help with itching as long as you apply moisturizing lotion after the shower.  Do not use scented soaps, detergents, perfumes, and cosmetic products. Instead, use gentle, unscented versions of these items.   General instructions  Avoid wearing tight clothes.  Keep a journal to help find out what is causing your itching. Write down: ? What you eat and drink. ? What cosmetic products you use. ? What soaps or detergents you use. ? What you wear,  including jewelry.  Use a humidifier. This keeps the air moist, which helps to prevent dry skin.  Be aware of any changes in your itchiness. Contact a health care provider if:  The itching does not go away after several days.  You are unusually thirsty or urinating more than normal.  Your skin tingles or feels numb.  Your skin or the white parts of your eyes turn yellow (jaundice).  You feel weak.  You have any of the following: ? Night sweats. ? Tiredness (fatigue). ? Weight loss. ? Abdominal pain. Summary  Pruritus is an itchy feeling on the skin. One of the  most common causes is dry skin, but many different conditions and factors can cause itching.  Apply moisturizing lotion to your skin as needed. Lotion that contains petroleum jelly is best.  Take medicines or apply medicated creams only as told by your health care provider.  Do not take hot showers or baths. Do not use scented soaps, detergents, perfumes, or cosmetic products. This information is not intended to replace advice given to you by your health care provider. Make sure you discuss any questions you have with your health care provider. Document Revised: 12/29/2017 Document Reviewed: 12/29/2017 Elsevier Patient Education  2021 [place]. Triamcinolone Aerosol, Cream, Lotion, Ointment What is this medicine? TRIAMCINOLONE (trye am SIN [place]) is a corticosteroid. It is used on the skin to reduce swelling, redness, itching, and allergic reactions. This medicine may be used for other purposes; ask your health care provider or pharmacist if you have questions. COMMON BRAND NAME(S): Aristocort, Aristocort A, Aristocort HP, Cinalog, Cinolar, DERMASORB TA Complete, Flutex, Kenalog, Pediaderm TA, Sila III, SP Rx 228, Triacet, Trianex, Triderm What should I tell my health care provider before I take this medicine? They need to know if you have any of these conditions:  any active infection  large areas of burned or damaged skin  skin wasting or thinning  an unusual or allergic reaction to triamcinolone, corticosteroids, other medicines, foods, dyes, or preservatives  pregnant or trying to get pregnant  breast-feeding How should I use this medicine? This medicine is for external use only. Do not take by mouth. Follow the directions on the prescription label. Wash your hands before and after use. Apply a thin film of medicine to the affected area. Do not cover with a bandage or dressing unless your doctor or health care professional tells you to. Do not use on healthy skin or over  large areas of skin. Do not get this medicine in your eyes. If you do, rinse out with plenty of cool tap water. It is important not to use more medicine than prescribed. Do not use your medicine more often than directed. Talk to your pediatrician regarding the use of this medicine in children. Special care may be needed. Elderly patients are more likely to have damaged skin through aging, and this may increase side effects. This medicine should only be used for brief periods and infrequently in older patients. Overdosage: If you think you have taken too much of this medicine contact a poison control center or emergency room at once. NOTE: This medicine is only for you. Do not share this medicine with others. What if I miss a dose? If you miss a dose, use it as soon as you can. If it is almost time for your next dose, use only that dose. Do not use double or extra doses. What may interact with this medicine? Interactions are not expected. This  list may not describe all possible interactions. Give your health care provider a list of all the medicines, herbs, non-prescription drugs, or dietary supplements you use. Also tell them if you smoke, drink alcohol, or use illegal drugs. Some items may interact with your medicine. What should I watch for while using this medicine? Tell your doctor or health care professional if your symptoms do not start to get better within one week. Do not use for more than 14 days. Do not use on healthy skin or over large areas of skin. Tell your doctor or health care professional if you are exposed to anyone with measles or chickenpox, or if you develop sores or blisters that do not heal properly. Do not use an airtight bandage to cover the affected area unless your doctor or health care professional tells you to. If you are to cover the area, follow the instructions carefully. Covering the area where the medicine is applied can increase the amount that passes through the skin  and increases the risk of side effects. If treating the diaper area of a child, avoid covering the treated area with tight-fitting diapers or plastic pants. This may increase the amount of medicine that passes through the skin and increase the risk of serious side effects. What side effects may I notice from receiving this medicine? Side effects that you should report to your doctor or health care professional as soon as possible:  burning or itching of the skin  dark red spots on the skin  infection  painful, red, pus filled blisters in hair follicles  thinning of the skin, sunburn more likely especially on the face Side effects that usually do not require medical attention (report to your doctor or health care professional if they continue or are bothersome):  dry skin, irritation  unusual increased growth of hair on the face or body This list may not describe all possible side effects. Call your doctor for medical advice about side effects. You may report side effects to FDA at 1-800-FDA-1088. Where should I keep my medicine? Keep out of the reach of children. Store at room temperature between 15 and 30 degrees C (59 and 86 degrees F). Do not freeze. Throw away any unused medicine after the expiration date. NOTE: This sheet is a summary. It may not cover all possible information. If you have questions about this medicine, talk to your doctor, pharmacist, or health care provider.  2021 Elsevier/Gold Standard (2020-10-25 11:04:44)

## 2021-03-22 ENCOUNTER — Telehealth: Payer: Self-pay

## 2021-03-22 NOTE — Telephone Encounter (Signed)
Nutrition  Mother called to reschedule nutrition appointment.  3/29 appointment moved to 3/30.    Brian Dixon B. Zenia Resides, [place], [place] Registered Dietitian [phone] (mobile)

## 2021-03-22 NOTE — Telephone Encounter (Signed)
Nutrition  Mother called to reschedule nutrition appointment from 3/29 to 3/30.  Zackaria Burkey B. Zenia Resides, [place], [place] Registered Dietitian [phone] (mobile)

## 2021-03-25 NOTE — Telephone Encounter (Signed)
Addressed with refills in Nov 21

## 2021-03-26 ENCOUNTER — Inpatient Hospital Stay: Payer: Medicare HMO

## 2021-03-27 ENCOUNTER — Inpatient Hospital Stay: Payer: Medicare HMO

## 2021-03-27 ENCOUNTER — Telehealth: Payer: Self-pay

## 2021-03-27 NOTE — Telephone Encounter (Signed)
Nutrition  Patient called to cancel nutrition appointment for today as he is not feeling well.  Waiting on call back from his PCP.  Appointment reschedule for 4/20 with Ernestene Kiel.  Patient aware.  Earland Reish B. Zenia Resides, [place], [place] Registered Dietitian [phone] (mobile)

## 2021-04-01 ENCOUNTER — Telehealth: Payer: Self-pay

## 2021-04-01 NOTE — Telephone Encounter (Signed)
Please call pt cause he needs to go with you on where your going to work!

## 2021-04-17 ENCOUNTER — Inpatient Hospital Stay: Payer: Medicare HMO | Attending: Medical | Admitting: Dietician

## 2021-04-17 ENCOUNTER — Inpatient Hospital Stay: Payer: Medicare HMO | Admitting: Nutrition

## 2021-04-17 ENCOUNTER — Telehealth: Payer: Self-pay | Admitting: Dietician

## 2021-04-17 NOTE — Telephone Encounter (Signed)
Nutrition   Patient did not show for scheduled for nutrition follow-up and weight check today. Unable to reach patient via phone. Voicemail left with request for return call. Contact information provided.  Received return phone call from patient this afternoon. Patient reports he fell off of a ladder yesterday and took a couple of pain pills late last night. Patient reports not walking up until 1 PM today and would like to reschedule appointment. Patient reports he does not need to go to emergency room, says he did not injure himself and states "I'm just a little stiff today that's all."   Nutrition follow-up rescheduled for Tuesday, April 26. Patient aware.  Lajuan Lines, RD, [place] Cell [phone]

## 2021-04-23 ENCOUNTER — Other Ambulatory Visit: Payer: Self-pay

## 2021-04-23 ENCOUNTER — Inpatient Hospital Stay: Payer: Medicare HMO | Admitting: Dietician

## 2021-04-23 NOTE — Progress Notes (Signed)
Nutrition Follow-up:  Patient with oropharynx and tongue cancer. Radiation completed 01/12/19.  Met with patient in clinic for weight check and follow-up. He reports good appetite and eating well. Patient reports eating grits or eggs with hash and gravy most mornings, eats a lot of soups for lunch and reports adding different flavors of packaged tuna to chicken noodle soup. He reports eating chicken and dumplings, creamed potatoes, french cut green beans, catfish, grilled hamburger chopped up in creamed potatoes with gravy and usually eats a can of vienna sausages around 9 for a bedtime snack. His mother prepared a big batch of chicken cassorole made with cream of mushroom soup, reports he has been eating a lot of that this week. Patient snacks on jello, boiled eggs and is drinking 3 Ensure Enlive daily. Patient reports drinking water burns his throat. He continues to drink gatorade, body armor and likes sweet tea and propell waters. Patient reports regular bowel movements, denies nausea, vomiting. He continues SLP recommended exercises multiple times a day.   Medications: reviewed  Labs: reviewed  Anthropometrics:   Weight today in RD office 148 lbs - stable  2/15 - 147 lb 2 oz 1/11 - 147 lb 4 oz  06/28/19 - 165 lb (PEG tube removed)   NUTRITION DIAGNOSIS: Inadequate oral intake stable   INTERVENTION:  Patient to continue Ensure Enlive TID Coupons given today Continue eating 3 meals plus snacks RD contact information provided    MONITORING, EVALUATION, GOAL: weight trends, intake   NEXT VISIT: Friday June 3 in clinic for weight check

## 2021-04-29 ENCOUNTER — Telehealth (HOSPITAL_COMMUNITY): Payer: Self-pay | Admitting: Dietician

## 2021-04-29 NOTE — Telephone Encounter (Signed)
Nutrition  Received message on voicemail from patient informing of new telephone number. Patient requesting contact number to be updated to [phone]. Per chart review, patient cell phone contact has been updated to new number.   Lajuan Lines, RD, [place] Cell [phone]

## 2021-05-06 ENCOUNTER — Ambulatory Visit: Payer: Medicare HMO

## 2021-05-31 ENCOUNTER — Inpatient Hospital Stay: Payer: Medicare HMO | Admitting: Dietician

## 2021-05-31 NOTE — Progress Notes (Signed)
Nutrition  Received phone call from patient this morning to cancel nutrition follow-up appointment. Patient reports his dad may have had another stroke and EMS was in route to home. Patient reports he has not figured out how to work his new phone and requested to leave voice message with new appointment time on parents answering machine [phone]). Patient rescheduled for June 21, message left on provided voicemail per pt request.

## 2021-06-07 ENCOUNTER — Telehealth: Payer: Self-pay | Admitting: Adult Health

## 2021-06-07 NOTE — Telephone Encounter (Signed)
Scheduled appointment per 06/10 sch msg. Left message with apt time.

## 2021-06-18 ENCOUNTER — Inpatient Hospital Stay: Payer: Medicare HMO | Attending: Medical | Admitting: Dietician

## 2021-06-18 ENCOUNTER — Other Ambulatory Visit: Payer: Self-pay

## 2021-06-18 DIAGNOSIS — E039 Hypothyroidism, unspecified: Secondary | ICD-10-CM | POA: Insufficient documentation

## 2021-06-18 DIAGNOSIS — Z79899 Other long term (current) drug therapy: Secondary | ICD-10-CM | POA: Insufficient documentation

## 2021-06-18 DIAGNOSIS — Z923 Personal history of irradiation: Secondary | ICD-10-CM | POA: Insufficient documentation

## 2021-06-18 DIAGNOSIS — C090 Malignant neoplasm of tonsillar fossa: Secondary | ICD-10-CM | POA: Insufficient documentation

## 2021-06-18 DIAGNOSIS — I890 Lymphedema, not elsewhere classified: Secondary | ICD-10-CM | POA: Insufficient documentation

## 2021-06-18 DIAGNOSIS — Z87891 Personal history of nicotine dependence: Secondary | ICD-10-CM | POA: Insufficient documentation

## 2021-06-18 DIAGNOSIS — Z8673 Personal history of transient ischemic attack (TIA), and cerebral infarction without residual deficits: Secondary | ICD-10-CM | POA: Insufficient documentation

## 2021-06-18 DIAGNOSIS — R269 Unspecified abnormalities of gait and mobility: Secondary | ICD-10-CM | POA: Insufficient documentation

## 2021-06-18 NOTE — Progress Notes (Signed)
Nutrition Follow-up:  Patient with oropharynx and tongue cancer. Radiation completed 01/12/19.  Met with patient in clinic for weight check. He reports good appetite and eating well, says his pants have gotten too tight. Patient reports 3 meals plus snacks, says he can go through an 18 pack of hard boiled eggs in a week. He drinks "plenty of fluids" - Ensure Complete three times/day as well as Gatorade, body armors, and Propell waters. Reports plain water and soft drinks continue to burn his throat. Patient is making chicken casserole for his parents anniversary dinner tonight. Yesterday he had gravy biscuit and scrambled eggs for breakfast, a few boiled eggs for snack, turkey with dressing for lunch, can of vienna sausages for afternoon snack, ribs potatoes with onions cooked in crock pot for dinner and ate an additional plate for bedtime snack. Patient reports using Biotene for dry mouth. He is running cool mist humidifier at night.     Medications: reviewed  Labs: reviewed  Anthropometrics: Weight 154.6 lb today in office (pt wearing boots) increased from 148 lbs (in office) on 4/26.   2/15 - 147 lb 2 oz 1/11 - 147 lb 4 oz  06/28/19 - 165 lb (PEG tube removed)  NUTRITION DIAGNOSIS: Inadequate oral intake stable    INTERVENTION:  Continue high calorie, high protein soft foods  Continue 3 meals plus 3 snacks for weight maintenance  Encouraged to decrease Ensure Complete to BID when (350 kcal, 30 g protein)  Coupons given today Continue Biotene and cool mist humidifier for dry mouth    MONITORING, EVALUATION, GOAL: weight trends, intake   NEXT VISIT: Tuesday, August 23 for weight check    

## 2021-06-24 ENCOUNTER — Inpatient Hospital Stay (HOSPITAL_BASED_OUTPATIENT_CLINIC_OR_DEPARTMENT_OTHER): Payer: Medicare HMO | Admitting: Adult Health

## 2021-06-24 ENCOUNTER — Encounter: Payer: Self-pay | Admitting: Adult Health

## 2021-06-24 ENCOUNTER — Inpatient Hospital Stay: Payer: Medicare HMO

## 2021-06-24 ENCOUNTER — Telehealth: Payer: Self-pay

## 2021-06-24 ENCOUNTER — Other Ambulatory Visit: Payer: Self-pay

## 2021-06-24 VITALS — BP 122/70 | HR 80 | Temp 97.8°F | Resp 18 | Ht 67.0 in | Wt 147.6 lb

## 2021-06-24 DIAGNOSIS — C090 Malignant neoplasm of tonsillar fossa: Secondary | ICD-10-CM

## 2021-06-24 DIAGNOSIS — Z87891 Personal history of nicotine dependence: Secondary | ICD-10-CM | POA: Diagnosis not present

## 2021-06-24 DIAGNOSIS — E039 Hypothyroidism, unspecified: Secondary | ICD-10-CM | POA: Diagnosis not present

## 2021-06-24 DIAGNOSIS — Z79899 Other long term (current) drug therapy: Secondary | ICD-10-CM | POA: Diagnosis not present

## 2021-06-24 DIAGNOSIS — Z8673 Personal history of transient ischemic attack (TIA), and cerebral infarction without residual deficits: Secondary | ICD-10-CM | POA: Diagnosis not present

## 2021-06-24 DIAGNOSIS — Z923 Personal history of irradiation: Secondary | ICD-10-CM | POA: Diagnosis not present

## 2021-06-24 DIAGNOSIS — R269 Unspecified abnormalities of gait and mobility: Secondary | ICD-10-CM | POA: Diagnosis not present

## 2021-06-24 DIAGNOSIS — I890 Lymphedema, not elsewhere classified: Secondary | ICD-10-CM | POA: Diagnosis not present

## 2021-06-24 LAB — CMP (CANCER CENTER ONLY)
ALT: 39 U/L (ref 0–44)
AST: 76 U/L — ABNORMAL HIGH (ref 15–41)
Albumin: 3.9 g/dL (ref 3.5–5.0)
Alkaline Phosphatase: 65 U/L (ref 38–126)
Anion gap: 13 (ref 5–15)
BUN: 9 mg/dL (ref 6–20)
CO2: 25 mmol/L (ref 22–32)
Calcium: 9.3 mg/dL (ref 8.9–10.3)
Chloride: 106 mmol/L (ref 98–111)
Creatinine: 0.87 mg/dL (ref 0.61–1.24)
GFR, Estimated: >60 mL/min (ref >60)
Glucose, Bld: 82 mg/dL (ref 70–99)
Potassium: 4 mmol/L (ref 3.5–5.1)
Sodium: 144 mmol/L (ref 135–145)
Total Bilirubin: 0.7 mg/dL (ref 0.3–1.2)
Total Protein: 7.9 g/dL (ref 6.5–8.1)

## 2021-06-24 LAB — T4, FREE: Free T4: 0.69 ng/dL (ref 0.61–1.12)

## 2021-06-24 LAB — TSH: TSH: 1.993 u[IU]/mL (ref 0.320–4.118)

## 2021-06-24 NOTE — Telephone Encounter (Signed)
-----   Message from Gardenia Phlegm, NP sent at 06/24/2021 11:46 AM EDT ----- Please let patient know the labs we drew were normal.  Thanks, Forrest ----- Message ----- From: Interface, Lab In [place] Sent: 06/24/2021  10:44 AM EDT To: Gardenia Phlegm, NP

## 2021-06-24 NOTE — Telephone Encounter (Signed)
Told Mother Jolayne Haines  that Mr Marchena labs done today were normal as noted below by Corona. Mother will inform Mr. Trice.

## 2021-06-24 NOTE — Patient Instructions (Signed)
Everything looks good.  There are no signs of cancer recurrence today.    We will check your thyroid function, kidney and liver functions today.  Due to your smoking history, I have placed an order for you to have lung cancer screening, with a CT scan.    You will see Dr. Isidore Moos in September of this year.    Please call our office for any questions or concerns you might have.  Thank you for entrusting [facility] with your care!
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
## 2021-08-19 ENCOUNTER — Telehealth (HOSPITAL_COMMUNITY): Payer: Self-pay | Admitting: Dietician

## 2021-08-19 NOTE — Telephone Encounter (Signed)
Nutrition  Received phone call from patient with request to reschedule nutrition follow-up on 8/23 due to transportation issues. Appointment has been rescheduled for Friday September 16 following MD visit. Patient aware.

## 2021-08-20 ENCOUNTER — Encounter: Payer: Medicare HMO | Admitting: Dietician

## 2021-09-06 ENCOUNTER — Other Ambulatory Visit: Payer: Self-pay

## 2021-09-06 ENCOUNTER — Emergency Department (HOSPITAL_BASED_OUTPATIENT_CLINIC_OR_DEPARTMENT_OTHER): Payer: Medicare HMO

## 2021-09-06 ENCOUNTER — Emergency Department (HOSPITAL_BASED_OUTPATIENT_CLINIC_OR_DEPARTMENT_OTHER)
Admission: EM | Admit: 2021-09-06 | Discharge: 2021-09-06 | Disposition: A | Discharge status: Home / Self Care | Payer: Medicare HMO | Attending: Emergency Medicine | Admitting: Emergency Medicine

## 2021-09-06 ENCOUNTER — Encounter: Payer: Self-pay | Admitting: Family Medicine

## 2021-09-06 ENCOUNTER — Encounter (HOSPITAL_BASED_OUTPATIENT_CLINIC_OR_DEPARTMENT_OTHER): Payer: Self-pay | Admitting: Obstetrics and Gynecology

## 2021-09-06 ENCOUNTER — Ambulatory Visit (INDEPENDENT_AMBULATORY_CARE_PROVIDER_SITE_OTHER): Payer: Medicare HMO | Admitting: Family Medicine

## 2021-09-06 VITALS — BP 138/88 | HR 111 | Temp 98.4°F | Resp 16 | Ht 67.0 in | Wt 152.0 lb

## 2021-09-06 DIAGNOSIS — R000 Tachycardia, unspecified: Secondary | ICD-10-CM | POA: Diagnosis not present

## 2021-09-06 DIAGNOSIS — E876 Hypokalemia: Secondary | ICD-10-CM | POA: Insufficient documentation

## 2021-09-06 DIAGNOSIS — I10 Essential (primary) hypertension: Secondary | ICD-10-CM | POA: Insufficient documentation

## 2021-09-06 DIAGNOSIS — Z789 Other specified health status: Secondary | ICD-10-CM

## 2021-09-06 DIAGNOSIS — R7989 Other specified abnormal findings of blood chemistry: Secondary | ICD-10-CM | POA: Diagnosis not present

## 2021-09-06 DIAGNOSIS — L03211 Cellulitis of face: Secondary | ICD-10-CM | POA: Insufficient documentation

## 2021-09-06 DIAGNOSIS — G629 Polyneuropathy, unspecified: Secondary | ICD-10-CM

## 2021-09-06 DIAGNOSIS — R6884 Jaw pain: Secondary | ICD-10-CM | POA: Insufficient documentation

## 2021-09-06 DIAGNOSIS — I6330 Cerebral infarction due to thrombosis of unspecified cerebral artery: Secondary | ICD-10-CM

## 2021-09-06 DIAGNOSIS — C090 Malignant neoplasm of tonsillar fossa: Secondary | ICD-10-CM

## 2021-09-06 DIAGNOSIS — Z7289 Other problems related to lifestyle: Secondary | ICD-10-CM

## 2021-09-06 DIAGNOSIS — K219 Gastro-esophageal reflux disease without esophagitis: Secondary | ICD-10-CM | POA: Diagnosis not present

## 2021-09-06 DIAGNOSIS — F17200 Nicotine dependence, unspecified, uncomplicated: Secondary | ICD-10-CM | POA: Diagnosis not present

## 2021-09-06 DIAGNOSIS — R0989 Other specified symptoms and signs involving the circulatory and respiratory systems: Secondary | ICD-10-CM

## 2021-09-06 DIAGNOSIS — Z85818 Personal history of malignant neoplasm of other sites of lip, oral cavity, and pharynx: Secondary | ICD-10-CM | POA: Insufficient documentation

## 2021-09-06 DIAGNOSIS — L0211 Cutaneous abscess of neck: Secondary | ICD-10-CM | POA: Diagnosis not present

## 2021-09-06 DIAGNOSIS — Z79899 Other long term (current) drug therapy: Secondary | ICD-10-CM | POA: Diagnosis not present

## 2021-09-06 DIAGNOSIS — R220 Localized swelling, mass and lump, head: Secondary | ICD-10-CM | POA: Diagnosis not present

## 2021-09-06 DIAGNOSIS — F17210 Nicotine dependence, cigarettes, uncomplicated: Secondary | ICD-10-CM | POA: Diagnosis not present

## 2021-09-06 DIAGNOSIS — R519 Headache, unspecified: Secondary | ICD-10-CM

## 2021-09-06 LAB — COMPREHENSIVE METABOLIC PANEL
ALT: 31 U/L (ref 0–53)
ALT: 27 U/L (ref 0–44)
AST: 51 U/L — ABNORMAL HIGH (ref 0–37)
AST: 47 U/L — ABNORMAL HIGH (ref 15–41)
Albumin: 4.5 g/dL (ref 3.5–5.2)
Albumin: 4.4 g/dL (ref 3.5–5.0)
Alkaline Phosphatase: 93 U/L (ref 39–117)
Alkaline Phosphatase: 78 U/L (ref 38–126)
Anion gap: 14 (ref 5–15)
BUN: 9 mg/dL (ref 6–23)
BUN: 9 mg/dL (ref 6–20)
CO2: 28 mEq/L (ref 19–32)
CO2: 24 mmol/L (ref 22–32)
Calcium: 9.6 mg/dL (ref 8.4–10.5)
Calcium: 9.4 mg/dL (ref 8.9–10.3)
Chloride: 99 mEq/L (ref 96–112)
Chloride: 100 mmol/L (ref 98–111)
Creatinine, Ser: 0.84 mg/dL (ref 0.40–1.50)
Creatinine, Ser: 0.78 mg/dL (ref 0.61–1.24)
GFR, Estimated: >60 mL/min (ref >60)
GFR: 98.41 mL/min (ref >60.00)
Glucose, Bld: 120 mg/dL — ABNORMAL HIGH (ref 70–99)
Glucose, Bld: 136 mg/dL — ABNORMAL HIGH (ref 70–99)
Potassium: 3.8 mEq/L (ref 3.5–5.1)
Potassium: 3.4 mmol/L — ABNORMAL LOW (ref 3.5–5.1)
Sodium: 140 mEq/L (ref 135–145)
Sodium: 138 mmol/L (ref 135–145)
Total Bilirubin: 1.1 mg/dL (ref 0.2–1.2)
Total Bilirubin: 1 mg/dL (ref 0.3–1.2)
Total Protein: 8.1 g/dL (ref 6.0–8.3)
Total Protein: 8.2 g/dL — ABNORMAL HIGH (ref 6.5–8.1)

## 2021-09-06 LAB — CBC
HCT: 42.4 % (ref 39.0–52.0)
Hemoglobin: 14.2 g/dL (ref 13.0–17.0)
MCHC: 33.6 g/dL (ref 30.0–36.0)
MCV: 104.5 fl — ABNORMAL HIGH (ref 78.0–100.0)
Platelets: 116 10*3/uL — ABNORMAL LOW (ref 150.0–400.0)
RBC: 4.05 Mil/uL — ABNORMAL LOW (ref 4.22–5.81)
RDW: 14.1 % (ref 11.5–15.5)
WBC: 5.6 10*3/uL (ref 4.0–10.5)

## 2021-09-06 LAB — CBC WITH DIFFERENTIAL/PLATELET
Abs Immature Granulocytes: 0.01 10*3/uL (ref 0.00–0.07)
Basophils Absolute: 0.1 10*3/uL (ref 0.0–0.1)
Basophils Relative: 1 %
Eosinophils Absolute: 0.1 10*3/uL (ref 0.0–0.5)
Eosinophils Relative: 2 %
HCT: 38.8 % — ABNORMAL LOW (ref 39.0–52.0)
Hemoglobin: 13.2 g/dL (ref 13.0–17.0)
Immature Granulocytes: 0 %
Lymphocytes Relative: 18 %
Lymphs Abs: 1 10*3/uL (ref 0.7–4.0)
MCH: 34.6 pg — ABNORMAL HIGH (ref 26.0–34.0)
MCHC: 34 g/dL (ref 30.0–36.0)
MCV: 101.6 fL — ABNORMAL HIGH (ref 80.0–100.0)
Monocytes Absolute: 0.6 10*3/uL (ref 0.1–1.0)
Monocytes Relative: 12 %
Neutro Abs: 3.7 10*3/uL (ref 1.7–7.7)
Neutrophils Relative %: 67 %
Platelets: 110 10*3/uL — ABNORMAL LOW (ref 150–400)
RBC: 3.82 MIL/uL — ABNORMAL LOW (ref 4.22–5.81)
RDW: 13.7 % (ref 11.5–15.5)
WBC: 5.4 10*3/uL (ref 4.0–10.5)
nRBC: 0 % (ref 0.0–0.2)

## 2021-09-06 LAB — LIPID PANEL
Cholesterol: 205 mg/dL — ABNORMAL HIGH (ref 0–200)
HDL: 114.2 mg/dL (ref >39.00)
LDL Cholesterol: 71 mg/dL (ref 0–99)
NonHDL: 90.87
Total CHOL/HDL Ratio: 2
Triglycerides: 101 mg/dL (ref 0.0–149.0)
VLDL: 20.2 mg/dL (ref 0.0–40.0)

## 2021-09-06 LAB — TSH: TSH: 2.86 u[IU]/mL (ref 0.35–5.50)

## 2021-09-06 MED ORDER — CEPHALEXIN 250 MG PO CAPS
500.0000 mg | ORAL_CAPSULE | Freq: Once | ORAL | Status: AC
  Administered 2021-09-06: 500 mg via ORAL
  Filled 2021-09-06: qty 2

## 2021-09-06 MED ORDER — FENTANYL CITRATE PF 50 MCG/ML IJ SOSY
50.0000 ug | PREFILLED_SYRINGE | Freq: Once | INTRAMUSCULAR | Status: AC
Start: 2021-09-06 — End: 2021-09-06
  Administered 2021-09-06: 50 ug via INTRAVENOUS
  Filled 2021-09-06: qty 1

## 2021-09-06 MED ORDER — POTASSIUM CHLORIDE CRYS ER 10 MEQ PO TBCR: 20.0000 meq | EXTENDED_RELEASE_TABLET | Freq: Every day | ORAL | 3 refills | Status: DC

## 2021-09-06 MED ORDER — IOHEXOL 350 MG/ML SOLN
100.0000 mL | Freq: Once | INTRAVENOUS | Status: AC | PRN
  Administered 2021-09-06: 50 mL via INTRAVENOUS

## 2021-09-06 MED ORDER — OXYCODONE-ACETAMINOPHEN 5-325 MG PO TABS
1.0000 | ORAL_TABLET | ORAL | Status: DC | PRN
  Administered 2021-09-06: 1 via ORAL
  Filled 2021-09-06: qty 1

## 2021-09-06 MED ORDER — CEPHALEXIN 500 MG PO CAPS: 500.0000 mg | ORAL_CAPSULE | Freq: Four times a day (QID) | ORAL | 0 refills | Status: AC

## 2021-09-06 NOTE — Patient Instructions (Addendum)
I will check labs. Continue to try to cut back on cigarettes - keep up the good work. I also recommend cutting back on alcohol, but will also check liver tests today.  Try 2 of the lower dose potassium to see if that is easier to swallow.   Heart rate is elevated today.  I will check your blood counts as well as thyroid test but I am concerned that could be related to possible jaw or facial infection.  With your previous radiation and surgery, I would recommend evaluation through emergency room today as likely will need imaging such as a CT scan and then plan can be discussed with your specialists if needed.  Although lymphedema can cause swelling, I am concerned there may be something more with the pain.  Depending on their work-up, I am happy to follow-up with you next week if needed.  Lungs sound clear today but chest congestion can be discussed with emergency room provider as well.  No other medication changes for now.  Follow-up with me in the next few weeks to review labs and continue establish care visit.  Thank you for coming in today and let me know if I can help further.   Managing the Challenge of Quitting Smoking Quitting smoking is a physical and mental challenge. You will face cravings, withdrawal symptoms, and temptation. Before quitting, work with your health care provider to make a plan that can help you manage quitting. Preparation can help you quit and keep you from giving in. How to manage lifestyle changes Managing stress Stress can make you want to smoke, and wanting to smoke may cause stress. It is important to find ways to manage your stress. You might try some of the following: Practice relaxation techniques. Breathe slowly and deeply, in through your nose and out through your mouth. Listen to music. Soak in a bath or take a shower. Imagine a peaceful place or vacation. Get some support. Talk with family or friends about your stress. Join a support group. Talk with a  counselor or therapist. Get some physical activity. Go for a walk, run, or bike ride. Play a favorite sport. Practice yoga.  Medicines Talk with your health care provider about medicines that might help you deal with cravings and make quitting easier for you. Relationships Social situations can be difficult when you are quitting smoking. To manage this, you can: Avoid parties and other social situations where people might be smoking. Avoid alcohol. Leave right away if you have the urge to smoke. Explain to your family and friends that you are quitting smoking. Ask for support and let them know you might be a bit grumpy. Plan activities where smoking is not an option. General instructions Be aware that many people gain weight after they quit smoking. However, not everyone does. To keep from gaining weight, have a plan in place before you quit and stick to the plan after you quit. Your plan should include: Having healthy snacks. When you have a craving, it may help to: Eat popcorn, carrots, celery, or other cut vegetables. Chew sugar-free gum. Changing how you eat. Eat small portion sizes at meals. Eat 4-6 small meals throughout the day instead of 1-2 large meals a day. Be mindful when you eat. Do not watch television or do other things that might distract you as you eat. Exercising regularly. Make time to exercise each day. If you do not have time for a long workout, do short bouts of exercise for 5-10 minutes  several times a day. Do some form of strengthening exercise, such as weight lifting. Do some exercise that gets your heart beating and causes you to breathe deeply, such as walking fast, running, swimming, or biking. This is very important. Drinking plenty of water or other low-calorie or no-calorie drinks. Drink 6-8 glasses of water daily.  How to recognize withdrawal symptoms Your body and mind may experience discomfort as you try to get used to not having nicotine in your  system. These effects are called withdrawal symptoms. They may include: Feeling hungrier than normal. Having trouble concentrating. Feeling irritable or restless. Having trouble sleeping. Feeling depressed. Craving a cigarette. To manage withdrawal symptoms: Avoid places, people, and activities that trigger your cravings. Remember why you want to quit. Get plenty of sleep. Avoid coffee and other caffeinated drinks. These may worsen some of your symptoms. These symptoms may surprise you. But be assured that they are normal to have when quitting smoking. How to manage cravings Come up with a plan for how to deal with your cravings. The plan should include the following: A definition of the specific situation you want to deal with. An alternative action you will take. A clear idea for how this action will help. The name of someone who might help you with this. Cravings usually last for 5-10 minutes. Consider taking the following actions to help you with your plan to deal with cravings: Keep your mouth busy. Chew sugar-free gum. Suck on hard candies or a straw. Brush your teeth. Keep your hands and body busy. Change to a different activity right away. Squeeze or play with a ball. Do an activity or a hobby, such as making bead jewelry, practicing needlepoint, or working with wood. Mix up your normal routine. Take a short exercise break. Go for a quick walk or run up and down stairs. Focus on doing something kind or helpful for someone else. Call a friend or family member to talk during a craving. Join a support group. Contact a quitline. Where to find support To get help or find a support group: Call the [facility]'s Smoking Quitline: 1-800-QUIT NOW 347-518-6701) Visit the website of the Substance Abuse and [place]: ktimeonline.com Text QUIT to SmokefreeTXTMQ:317211 Where to find more information Visit these websites to find more information  on quitting smoking: [place]: www.smokefree.gov American Lung Association: www.lung.org American Cancer Society: www.cancer.org Centers for Disease Control and Prevention: http://www.wolf.info/ American Heart Association: www.heart.org Contact a health care provider if: You want to change your plan for quitting. The medicines you are taking are not helping. Your eating feels out of control or you cannot sleep. Get help right away if: You feel depressed or become very anxious. Summary Quitting smoking is a physical and mental challenge. You will face cravings, withdrawal symptoms, and temptation to smoke again. Preparation can help you as you go through these challenges. Try different techniques to manage stress, handle social situations, and prevent weight gain. You can deal with cravings by keeping your mouth busy (such as by chewing gum), keeping your hands and body busy, calling family or friends, or contacting a quitline for people who want to quit smoking. You can deal with withdrawal symptoms by avoiding places where people smoke, getting plenty of rest, and avoiding drinks with caffeine. This information is not intended to replace advice given to you by your health care provider. Make sure you discuss any questions you have with your health care provider. Document Revised: 10/04/2019  Document Reviewed: 10/04/2019 Elsevier Patient Education  2022 [employer].
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
## 2021-09-06 NOTE — Discharge Instructions (Signed)
Your potassium was a little low today, take an extra dose of your potassium when you get home.  If you develop fever, new or worsening pain or swelling, trouble breathing or swallowing, or any other new/concerning symptoms then return to the ER for evaluation.

## 2021-09-06 NOTE — ED Triage Notes (Signed)
Patient reports to the ER for jaw pain on the right lower side. Patient has had radiation to his mouth and reports that is the same spot where the pain is.

## 2021-09-13 ENCOUNTER — Telehealth: Payer: Self-pay | Admitting: Dietician

## 2021-09-13 ENCOUNTER — Telehealth: Payer: Self-pay | Admitting: *Deleted

## 2021-09-13 ENCOUNTER — Inpatient Hospital Stay: Payer: Medicare HMO | Admitting: Dietician

## 2021-09-13 ENCOUNTER — Ambulatory Visit: Payer: Medicare HMO | Admitting: Radiation Oncology

## 2021-09-13 NOTE — Telephone Encounter (Signed)
Nutrition  Received call from patient with request to cancel nutrition follow-up today. Patient reports he was seen in ED overnight due to significant facial swelling due to ear infection, says he has been unable to eat solid foods in the last 4 days. Patient reports emergency physician prescribed antibiotic for him to take 4x/day, says he feels nauseas after morning dose. Patient has been eating chicken noodle soup, mashed vienna sausages and drinking 4 Ensure Plus. Encouraged pt to drink 5-6 Ensure Plus as tolerated until able to resume regular diet. Patient politely declined need for coupons, says he has 3 cases at home. Nutrition appointment rescheduled for 9/23.

## 2021-09-13 NOTE — Telephone Encounter (Signed)
Called patient to ask about rescheduling fu for today, spoke with patient and he agreed to come on 09-20-21 @ 2:40 pm

## 2021-09-15 ENCOUNTER — Ambulatory Visit (INDEPENDENT_AMBULATORY_CARE_PROVIDER_SITE_OTHER): Payer: Medicare HMO

## 2021-09-15 DIAGNOSIS — Z0000 Encounter for general adult medical examination without abnormal findings: Secondary | ICD-10-CM | POA: Diagnosis not present

## 2021-09-15 NOTE — Patient Instructions (Addendum)
Health Maintenance, Male Adopting a healthy lifestyle and getting preventive care are important in promoting health and wellness. Ask your health care provider about: The right schedule for you to have regular tests and exams. Things you can do on your own to prevent diseases and keep yourself healthy. What should I know about diet, weight, and exercise? Eat a healthy diet  Eat a diet that includes plenty of vegetables, fruits, low-fat dairy products, and lean protein. Do not eat a lot of foods that are high in solid fats, added sugars, or sodium. Maintain a healthy weight Body mass index (BMI) is a measurement that can be used to identify possible weight problems. It estimates body fat based on height and weight. Your health care provider can help determine your BMI and help you achieve or maintain a healthy weight. Get regular exercise Get regular exercise. This is one of the most important things you can do for your health. Most adults should: Exercise for at least 150 minutes each week. The exercise should increase your heart rate and make you sweat (moderate-intensity exercise). Do strengthening exercises at least twice a week. This is in addition to the moderate-intensity exercise. Spend less time sitting. Even light physical activity can be beneficial. Watch cholesterol and blood lipids Have your blood tested for lipids and cholesterol at 55 years of age, then have this test every 5 years. You may need to have your cholesterol levels checked more often if: Your lipid or cholesterol levels are high. You are older than 55 years of age. You are at high risk for heart disease. What should I know about cancer screening? Many types of cancers can be detected early and may often be prevented. Depending on your health history and family history, you may need to have cancer screening at various ages. This may include screening for: Colorectal cancer. Prostate cancer. Skin cancer. Lung  cancer. What should I know about heart disease, diabetes, and high blood pressure? Blood pressure and heart disease High blood pressure causes heart disease and increases the risk of stroke. This is more likely to develop in people who have high blood pressure readings, are of African descent, or are overweight. Talk with your health care provider about your target blood pressure readings. Have your blood pressure checked: Every 3-5 years if you are 18-39 years of age. Every year if you are 40 years old or older. If you are between the ages of 65 and 75 and are a current or former smoker, ask your health care provider if you should have a one-time screening for abdominal aortic aneurysm (AAA). Diabetes Have regular diabetes screenings. This checks your fasting blood sugar level. Have the screening done: Once every three years after age 45 if you are at a normal weight and have a low risk for diabetes. More often and at a younger age if you are overweight or have a high risk for diabetes. What should I know about preventing infection? Hepatitis B If you have a higher risk for hepatitis B, you should be screened for this virus. Talk with your health care provider to find out if you are at risk for hepatitis B infection. Hepatitis C Blood testing is recommended for: Everyone born from 1945 through 1965. Anyone with known risk factors for hepatitis C. Sexually transmitted infections (STIs) You should be screened each year for STIs, including gonorrhea and chlamydia, if: You are sexually active and are younger than 55 years of age. You are older than 55 years   of age and your health care provider tells you that you are at risk for this type of infection. Your sexual activity has changed since you were last screened, and you are at increased risk for chlamydia or gonorrhea. Ask your health care provider if you are at risk. Ask your health care provider about whether you are at high risk for HIV.  Your health care provider may recommend a prescription medicine to help prevent HIV infection. If you choose to take medicine to prevent HIV, you should first get tested for HIV. You should then be tested every 3 months for as long as you are taking the medicine. Follow these instructions at home: Lifestyle Do not use any products that contain nicotine or tobacco, such as cigarettes, e-cigarettes, and chewing tobacco. If you need help quitting, ask your health care provider. Do not use street drugs. Do not share needles. Ask your health care provider for help if you need support or information about quitting drugs. Alcohol use Do not drink alcohol if your health care provider tells you not to drink. If you drink alcohol: Limit how much you have to 0-2 drinks a day. Be aware of how much alcohol is in your drink. In the [place] one drink equals one 12 oz bottle of beer (355 mL), one 5 oz glass of wine (148 mL), or one 1 oz glass of hard liquor (44 mL). General instructions Schedule regular health, dental, and eye exams. Stay current with your vaccines. Tell your health care provider if: You often feel depressed. You have ever been abused or do not feel safe at home. Summary Adopting a healthy lifestyle and getting preventive care are important in promoting health and wellness. Follow your health care provider's instructions about healthy diet, exercising, and getting tested or screened for diseases. Follow your health care provider's instructions on monitoring your cholesterol and blood pressure. This information is not intended to replace advice given to you by your health care provider. Make sure you discuss any questions you have with your health care provider. Document Revised: 02/22/2021 Document Reviewed: 12/08/2018 Elsevier Patient Education  2022 [place] Maintenance, Male Adopting a healthy lifestyle and getting preventive care are important in promoting health and  wellness. Ask your health care provider about: The right schedule for you to have regular tests and exams. Things you can do on your own to prevent diseases and keep yourself healthy. What should I know about diet, weight, and exercise? Eat a healthy diet  Eat a diet that includes plenty of vegetables, fruits, low-fat dairy products, and lean protein. Do not eat a lot of foods that are high in solid fats, added sugars, or sodium. Maintain a healthy weight Body mass index (BMI) is a measurement that can be used to identify possible weight problems. It estimates body fat based on height and weight. Your health care provider can help determine your BMI and help you achieve or maintain a healthy weight. Get regular exercise Get regular exercise. This is one of the most important things you can do for your health. Most adults should: Exercise for at least 150 minutes each week. The exercise should increase your heart rate and make you sweat (moderate-intensity exercise). Do strengthening exercises at least twice a week. This is in addition to the moderate-intensity exercise. Spend less time sitting. Even light physical activity can be beneficial. Watch cholesterol and blood lipids Have your blood tested for lipids and cholesterol at 55 years of age, then have  this test every 5 years. You may need to have your cholesterol levels checked more often if: Your lipid or cholesterol levels are high. You are older than 55 years of age. You are at high risk for heart disease. What should I know about cancer screening? Many types of cancers can be detected early and may often be prevented. Depending on your health history and family history, you may need to have cancer screening at various ages. This may include screening for: Colorectal cancer. Prostate cancer. Skin cancer. Lung cancer. What should I know about heart disease, diabetes, and high blood pressure? Blood pressure and heart disease High  blood pressure causes heart disease and increases the risk of stroke. This is more likely to develop in people who have high blood pressure readings, are of African descent, or are overweight. Talk with your health care provider about your target blood pressure readings. Have your blood pressure checked: Every 3-5 years if you are 78-59 years of age. Every year if you are 39 years old or older. If you are between the ages of 13 and 23 and are a current or former smoker, ask your health care provider if you should have a one-time screening for abdominal aortic aneurysm (AAA). Diabetes Have regular diabetes screenings. This checks your fasting blood sugar level. Have the screening done: Once every three years after age 64 if you are at a normal weight and have a low risk for diabetes. More often and at a younger age if you are overweight or have a high risk for diabetes. What should I know about preventing infection? Hepatitis B If you have a higher risk for hepatitis B, you should be screened for this virus. Talk with your health care provider to find out if you are at risk for hepatitis B infection. Hepatitis C Blood testing is recommended for: Everyone born from [birth year] through 1965. Anyone with known risk factors for hepatitis C. Sexually transmitted infections (STIs) You should be screened each year for STIs, including gonorrhea and chlamydia, if: You are sexually active and are younger than 55 years of age. You are older than 55 years of age and your health care provider tells you that you are at risk for this type of infection. Your sexual activity has changed since you were last screened, and you are at increased risk for chlamydia or gonorrhea. Ask your health care provider if you are at risk. Ask your health care provider about whether you are at high risk for HIV. Your health care provider may recommend a prescription medicine to help prevent HIV infection. If you choose to take medicine  to prevent HIV, you should first get tested for HIV. You should then be tested every 3 months for as long as you are taking the medicine. Follow these instructions at home: Lifestyle Do not use any products that contain nicotine or tobacco, such as cigarettes, e-cigarettes, and chewing tobacco. If you need help quitting, ask your health care provider. Do not use street drugs. Do not share needles. Ask your health care provider for help if you need support or information about quitting drugs. Alcohol use Do not drink alcohol if your health care provider tells you not to drink. If you drink alcohol: Limit how much you have to 0-2 drinks a day. Be aware of how much alcohol is in your drink. In the [place] one drink equals one 12 oz bottle of beer (355 mL), one 5 oz glass of wine (148 mL), or one  1 oz glass of hard liquor (44 mL). General instructions Schedule regular health, dental, and eye exams. Stay current with your vaccines. Tell your health care provider if: You often feel depressed. You have ever been abused or do not feel safe at home. Summary Adopting a healthy lifestyle and getting preventive care are important in promoting health and wellness. Follow your health care provider's instructions about healthy diet, exercising, and getting tested or screened for diseases. Follow your health care provider's instructions on monitoring your cholesterol and blood pressure. This information is not intended to replace advice given to you by your health care provider. Make sure you discuss any questions you have with your health care provider. Document Revised: 02/22/2021 Document Reviewed: 12/08/2018 [employer] Patient Education  2022 [employer].
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
## 2021-09-20 ENCOUNTER — Ambulatory Visit: Payer: Medicare HMO | Admitting: Radiation Oncology

## 2021-09-20 ENCOUNTER — Inpatient Hospital Stay: Payer: Medicare HMO | Admitting: Dietician

## 2021-09-20 ENCOUNTER — Telehealth: Payer: Self-pay | Admitting: Radiation Oncology

## 2021-09-20 NOTE — Progress Notes (Signed)
Oncology Nurse Navigator Documentation   Brian Dixon called me today to cancel his appointments with Brian Dixon RD and Brian Dixon today. He tells me that he has been vomiting this morning. I encouraged him to come to his appointments and that we could assist him as needed but he declined. He tells me that he has called his PCP for advice/help. He asked to be rescheduled and I have notified Brian Dixon, Brian Dixon, and scheduling of his wishes. While speaking with Brian Dixon I informed him of an appointment that I scheduled with his ENT MD for 10/26 at 2:40. He wrote in down along with the address to the appointment. He knows to call me if he has any future questions or concerns.   Brian Asa RN, BSN, OCN Head & Neck Oncology Nurse [place] at [facility] Phone # [phone]  Fax # [phone]

## 2021-09-20 NOTE — Progress Notes (Signed)
Nutrition   Received message from RN navigator [facility]) informing that patient called and cancelled appointments today secondary to not feeling well. Unable to reach patient this afternoon to reschedule appointment. Left voicemail with request to return call. Contact information provided.

## 2021-09-20 NOTE — Telephone Encounter (Signed)
Called patient to r/s today's appointment. Patient did not answer neither contact number. However, I was able to LVM on the home phone for a return call.

## 2021-09-24 ENCOUNTER — Other Ambulatory Visit: Payer: Self-pay | Admitting: Family Medicine

## 2021-09-24 DIAGNOSIS — I160 Hypertensive urgency: Secondary | ICD-10-CM

## 2021-09-24 DIAGNOSIS — Z760 Encounter for issue of repeat prescription: Secondary | ICD-10-CM

## 2021-09-24 DIAGNOSIS — I10 Essential (primary) hypertension: Secondary | ICD-10-CM

## 2021-09-24 NOTE — Telephone Encounter (Signed)
Last refill --12-11-21 by Lanelle Bal   Please advise

## 2021-09-27 NOTE — Progress Notes (Signed)
Oncology Nurse Navigator Documentation   I spoke with Brian Dixon on Thursday 09/26/21. I provided him with the contact information of Enid Derry Engineer, structural) to get rescheduled with Dr. Isidore Moos after cancelling two previous appointments. He told me that he would call to get the appointment rescheduled. He knows to call me if he has any further needs or concerns.  Harlow Asa RN, BSN, OCN Head & Neck Oncology Nurse [place] at [facility] Phone # [phone]  Fax # [phone]

## 2021-10-01 ENCOUNTER — Ambulatory Visit: Payer: Medicare HMO | Admitting: Radiation Oncology

## 2021-10-01 ENCOUNTER — Inpatient Hospital Stay: Payer: Medicare HMO | Attending: Radiation Oncology | Admitting: Dietician

## 2021-10-01 ENCOUNTER — Telehealth: Payer: Self-pay

## 2021-10-01 NOTE — Telephone Encounter (Signed)
Noticed patient had not been arrived for his F/U with Dr. Isidore Moos. Called and spoke with patient directly. He stated the person that was supposed to bring him had to cancel last minute, and he therefore had no way to get to clinic. He reports he continues to feel poorly and is vomiting about 2x day (verbalized understanding of need to go to ED for work up and possible rehydration, but declined interest or desire in going; reports his parents live next door and check on him regularly, so they will call EMS if his condition worsens.). He  agreed to be rescheduled with Dr. Isidore Moos and to utilize [facility]'s transportation service. Encouraged patient to call me or [facility] back directly should he have any other issues/concerns. Patient verbalized understanding and agreement

## 2021-10-01 NOTE — Progress Notes (Signed)
Nutrition  Patient did not show for nutrition follow-up today. Per RN, patient's ride did not show up to bring him to appointments. RN has contacted scheduling to reschedule missed appointments.

## 2021-10-09 ENCOUNTER — Other Ambulatory Visit: Payer: Self-pay

## 2021-10-09 ENCOUNTER — Ambulatory Visit (INDEPENDENT_AMBULATORY_CARE_PROVIDER_SITE_OTHER): Payer: Medicare HMO | Admitting: Family Medicine

## 2021-10-09 ENCOUNTER — Encounter: Payer: Self-pay | Admitting: Family Medicine

## 2021-10-09 VITALS — BP 132/70 | HR 96 | Temp 98.3°F | Resp 16 | Ht 67.0 in | Wt 156.4 lb

## 2021-10-09 DIAGNOSIS — R519 Headache, unspecified: Secondary | ICD-10-CM

## 2021-10-09 DIAGNOSIS — M25562 Pain in left knee: Secondary | ICD-10-CM | POA: Diagnosis not present

## 2021-10-09 DIAGNOSIS — C090 Malignant neoplasm of tonsillar fossa: Secondary | ICD-10-CM

## 2021-10-09 DIAGNOSIS — R220 Localized swelling, mass and lump, head: Secondary | ICD-10-CM | POA: Diagnosis not present

## 2021-10-09 DIAGNOSIS — H9201 Otalgia, right ear: Secondary | ICD-10-CM

## 2021-10-09 DIAGNOSIS — M79671 Pain in right foot: Secondary | ICD-10-CM | POA: Diagnosis not present

## 2021-10-09 DIAGNOSIS — D696 Thrombocytopenia, unspecified: Secondary | ICD-10-CM

## 2021-10-09 DIAGNOSIS — G629 Polyneuropathy, unspecified: Secondary | ICD-10-CM

## 2021-10-09 DIAGNOSIS — M25561 Pain in right knee: Secondary | ICD-10-CM | POA: Diagnosis not present

## 2021-10-09 DIAGNOSIS — M79672 Pain in left foot: Secondary | ICD-10-CM
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
## 2021-10-09 NOTE — Patient Instructions (Addendum)
Keep appointment with Dr. Redmond Baseman tomorrow to discuss ear pain, and swelling on side of face.  Xray at [facility] for your knees. Ok to continue gabapentin for now for foot symptoms/neuropathy.   Recheck in 1 month.  Return to the clinic or go to the nearest emergency room if any of your symptoms worsen or new symptoms occur.

## 2021-10-14 ENCOUNTER — Telehealth: Payer: Self-pay | Admitting: *Deleted

## 2021-10-14 NOTE — Telephone Encounter (Signed)
CALLED PATIENT TO ASK ABOUT RESCHEDULING FU, PATIENT AGREED TO COME ON 10-15-21 @ 2:20 PM

## 2021-10-15 ENCOUNTER — Ambulatory Visit
Admission: RE | Admit: 2021-10-15 | Discharge: 2021-10-15 | Disposition: A | Discharge status: Home / Self Care | Payer: Medicare HMO | Source: Ambulatory Visit | Attending: Radiation Oncology | Admitting: Radiation Oncology

## 2021-10-17 ENCOUNTER — Ambulatory Visit (INDEPENDENT_AMBULATORY_CARE_PROVIDER_SITE_OTHER)
Admission: RE | Admit: 2021-10-17 | Discharge: 2021-10-17 | Disposition: A | Discharge status: Home / Self Care | Payer: Medicare HMO | Source: Ambulatory Visit | Attending: Family Medicine | Admitting: Family Medicine

## 2021-10-17 ENCOUNTER — Other Ambulatory Visit: Payer: Self-pay

## 2021-10-17 DIAGNOSIS — M25562 Pain in left knee: Secondary | ICD-10-CM | POA: Diagnosis not present

## 2021-10-17 DIAGNOSIS — G629 Polyneuropathy, unspecified: Secondary | ICD-10-CM | POA: Diagnosis not present

## 2021-10-17 DIAGNOSIS — M25561 Pain in right knee: Secondary | ICD-10-CM | POA: Diagnosis not present

## 2021-10-24 ENCOUNTER — Encounter: Payer: Medicare HMO | Admitting: Dietician

## 2021-10-24 ENCOUNTER — Telehealth: Payer: Self-pay | Admitting: Dietician

## 2021-10-24 NOTE — Telephone Encounter (Signed)
Nutrition Follow-up:  Patient with oropharynx and tongue cancer. He has completed radiation therapy 01/12/19.  Spoke with patient via telephone. He reports his father tested positive for Covid via home test yesterday. Patient reports he tested negative yesterday, says he has been congested for the last month. Patient reports he has finished antibiotics for mouth abscess, but left side of his jaw is "hard as a rock" Patient reports he has follow-up with ENT on 11/8. Patient reports eating chicken noodle soup, moistened soft foods, drinking 3 Ensure Plus daily. He purchased a case of Ensure yesterday. Patient reports he has been using his workbench and lifting weights, says his chest is getting big.    Medications: reviewed  Labs: No new labs for review  Anthropometrics: Last weight 156 lb 6.4 oz on 10/12 increased from 151 lb on 9/9 and 147 lb 9.6 oz   NUTRITION DIAGNOSIS: Inadequate oral intake stable   INTERVENTION:  Continue eating high calorie, high protein soft foods Continue drinking 2-3 Ensure Plus/equivalent daily for weight maintenance - will mail coupons Patient has contact information     MONITORING, EVALUATION, GOAL: weight trends, intake   NEXT VISIT: To be scheduled

## 2021-11-01 ENCOUNTER — Telehealth: Payer: Self-pay | Admitting: *Deleted

## 2021-11-01 NOTE — Telephone Encounter (Signed)
RETURNED PATIENT'S PHONE CALL, SPOKE WITH PATIENT. ?

## 2021-11-04 ENCOUNTER — Encounter: Payer: Self-pay | Admitting: Family Medicine

## 2021-11-04 ENCOUNTER — Encounter: Payer: Self-pay | Admitting: Nurse Practitioner

## 2021-11-13 ENCOUNTER — Ambulatory Visit: Payer: Medicare HMO | Admitting: Family Medicine

## 2021-11-15 ENCOUNTER — Other Ambulatory Visit: Payer: Self-pay

## 2021-11-15 ENCOUNTER — Ambulatory Visit
Admission: RE | Admit: 2021-11-15 | Discharge: 2021-11-15 | Disposition: A | Discharge status: Home / Self Care | Payer: Medicare HMO | Source: Ambulatory Visit | Attending: Radiation Oncology | Admitting: Radiation Oncology

## 2021-11-15 ENCOUNTER — Telehealth: Payer: Self-pay | Admitting: *Deleted

## 2021-11-15 ENCOUNTER — Encounter: Payer: Self-pay | Admitting: Radiation Oncology

## 2021-11-15 VITALS — BP 138/87 | HR 105 | Temp 97.7°F | Resp 18 | Ht 67.0 in | Wt 157.2 lb

## 2021-11-15 DIAGNOSIS — Z923 Personal history of irradiation: Secondary | ICD-10-CM | POA: Diagnosis not present

## 2021-11-15 DIAGNOSIS — C090 Malignant neoplasm of tonsillar fossa: Secondary | ICD-10-CM | POA: Insufficient documentation

## 2021-11-15 DIAGNOSIS — Z85818 Personal history of malignant neoplasm of other sites of lip, oral cavity, and pharynx: Secondary | ICD-10-CM | POA: Diagnosis not present

## 2021-11-15 DIAGNOSIS — Z08 Encounter for follow-up examination after completed treatment for malignant neoplasm: Secondary | ICD-10-CM | POA: Diagnosis not present

## 2021-11-15 NOTE — Telephone Encounter (Signed)
CALLED PATIENT TO ASK ABOUT MISSING TODAY'S FU AND ASKING IF I CAN RESCHEDULE THIS FOR HIM, LVM FOR A RETURN CALL

## 2021-11-15 NOTE — Progress Notes (Signed)
Radiation Oncology         [phone] ________________________________  Name: Brian Dixon MRN: [id number]  Date: 11/15/2021  DOB: 1966-06-21  Follow-Up in person  Outpatient  CC: Wendie Agreste, MD  Melida Quitter, MD  Diagnosis and Prior Radiotherapy:        ICD-10-CM   1. 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[place])  C09.0        Cancer Staging  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[place]) Staging form: Pharynx - P16 Negative Oropharynx, AJCC 8th Edition - Clinical stage from 09/07/2018: Stage III (cT3, cN0, cM0, p16-) - Signed by Eppie Gibson, MD on 09/07/2018 Histologic grade (G): G2 Histologic grading system: 4 grade system - Pathologic: Stage III (pT3, pN0, cM0, p16-) - Signed by Eppie Gibson, MD on 11/05/2018  Radiation treatment dates:   11/29/2018-01/12/2019   Site/dose:     Oropharynx and tongue / total dose, 60Gy in 30 fractions  CHIEF COMPLAINT:   Here for follow-up of throat cancer  NARRATIVE:   Mr. Bontempo presents for follow up of radiation completed on 01/12/2019 to his oropharynx and tongue  Pain issues, if any: Reports constant ear ache to right side; states ear drop will relieve the pain for a couple days, but then pain will return. Also reports pain along the right side of his jaw. Was directed to ED from PCP's office in September, CT scan was taken and patient started on antibiotics. I personally reviewed the images from his CT scan which did not show any evidence of recurrent disease.  There was no evidence of osteomyelitis or osteonecrosis.  There is soft tissue induration and fat stranding in the right perimandibular region as noted previously in April 2020  Using a feeding tube?: N/A Weight changes, if any:  Wt Readings from Last 3 Encounters:  11/15/21 157 lb 4 oz (71.3 kg)  10/09/21 156 lb 6.4 oz (70.9 kg)  09/06/21 151 lb (68.5 kg)   Swallowing issues, if any: Yes--can really only tolerate liquids/soups. Drinks 3 boost supplements a day as  well Smoking or chewing tobacco? Reports he quit smoking about 2 months ago Using fluoride trays daily? N/A Last ENT visit was on: Planning to see Dr. Melida Quitter on 11/26/21 Other notable issues, if any: Continues to deal with dry mouth and thick saliva. Reports continued firmness and decreased range of motion to the right side of his neck despite doing her PT exercises regularly.    ALLERGIES:  is allergic to shellfish allergy.  Meds: Current Outpatient Medications  Medication Sig Dispense Refill   amLODipine (NORVASC) 5 MG tablet TAKE 1 TABLET(5 MG) BY MOUTH DAILY 30 tablet 4   antiseptic oral rinse (BIOTENE) LIQD 15 mLs by Mouth Rinse route as needed for dry mouth.     atorvastatin (LIPITOR) 40 MG tablet Take 1 tablet (40 mg total) by mouth every evening. 90 tablet 3   Dentifrices (BIOTENE DRY MOUTH GENTLE) PSTE Place 1 application onto teeth in the morning and at bedtime.     dextromethorphan-guaiFENesin (MUCINEX DM) 30-600 MG 12hr tablet Take 1 tablet by mouth at bedtime.     Ensure (ENSURE) Take 237 mLs by mouth 3 (three) times daily between meals.     gabapentin (NEURONTIN) 300 MG capsule Take 1 capsule (300 mg total) by mouth 2 (two) times daily. 180 capsule 3   Glycerin-Hypromellose-PEG 400 (DRY EYE RELIEF DROPS) 0.2-0.2-1 % SOLN Place 1 drop into both eyes 3 (three) times daily as needed (dry/irritated eyes.).  lisinopril (ZESTRIL) 10 MG tablet Take 1 tablet (10 mg total) by mouth daily. 90 tablet 3   Menthol-Camphor (ICY HOT ADVANCED RELIEF) 16-11 % CREA Apply 1 application topically 3 (three) times daily as needed (pain).     omeprazole (PRILOSEC) 20 MG capsule Take 1 capsule (20 mg total) by mouth daily. 90 capsule 3   potassium chloride SA (KLOR-CON) 10 MEQ tablet Take 2 tablets (20 mEq total) by mouth daily. 60 tablet 3   thiamine (VITAMIN B-1) 100 MG tablet Take 1 tablet (100 mg total) by mouth daily. 30 tablet 0   Vitamin D, Ergocalciferol, (DRISDOL) 1.25 MG (50000  UNIT) CAPS capsule Take 1 capsule (50,000 Units total) by mouth every 7 (seven) days. 5 capsule 6   No current facility-administered medications for this encounter.    Physical Findings:   Wt Readings from Last 3 Encounters:  11/15/21 157 lb 4 oz (71.3 kg)  10/09/21 156 lb 6.4 oz (70.9 kg)  09/06/21 151 lb (68.5 kg)    height is 5\' 7"  (1.702 m) and weight is 157 lb 4 oz (71.3 kg). His temporal temperature is 97.7 F (36.5 C). His blood pressure is 138/87 and his pulse is 105 (abnormal). His respiration is 18 and oxygen saturation is 99%. .  General: Alert and oriented, in no acute distress HEENT: Head is normocephalic. Extraocular movements are intact. Oropharynx and mouth are clear with postoperative changes.   No thrush.  At the posterior distal right ear canal there is a small erythematous sore; tympanic membranes within normal limits bilaterally Neck: Neck is supple, no palpable cervical or supraclavicular lymphadenopathy.  He does have moderate lymphedema in the right upper neck and right postauricular region Heart:  Regular in rhythm.  No murmurs Chest is clear to auscultation bilaterally Lymphatics: see Neck Exam Skin: No concerning lesions.    Lab Findings: Lab Results  Component Value Date   WBC 5.4 09/06/2021   HGB 13.2 09/06/2021   HCT 38.8 (L) 09/06/2021   MCV 101.6 (H) 09/06/2021   PLT 110 (L) 09/06/2021    Lab Results  Component Value Date   TSH 2.86 09/06/2021    Radiographic Findings: DG Knee Complete 4 Views Left  Result Date: 10/17/2021 CLINICAL DATA:  Bilateral medial knee pain, neuropathy EXAM: LEFT KNEE - COMPLETE 4+ VIEW COMPARISON:  None. FINDINGS: No evidence of fracture, dislocation, or joint effusion. No evidence of arthropathy or other focal bone abnormality. Soft tissues are unremarkable. IMPRESSION: No acute osseous findings or significant arthropathy. Electronically Signed   By: Jerilynn Mages.  Shick M.D.   On: 10/17/2021 10:38   DG Knee Complete 4 Views  Right  Result Date: 10/17/2021 CLINICAL DATA:  Knee pain, no known injury, rule out arthropathy EXAM: RIGHT KNEE - COMPLETE 4+ VIEW COMPARISON:  None. FINDINGS: No evidence of fracture, dislocation, or joint effusion. No evidence of arthropathy or other focal bone abnormality. Soft tissues are unremarkable. IMPRESSION: No fracture or dislocation of the right knee. Joint spaces are preserved. Electronically Signed   By: Delanna Ahmadi M.D.   On: 10/17/2021 10:39    Impression/Plan:    1) Head and Neck Cancer Status:  NED  2) Nutritional Status: This is stabilized since his last appointment.  He reports that he manages well with soft foods and nutritional supplements PEG tube: removed 05/2019  3) Risk Factors: The patient has been educated about risk factors including alcohol and tobacco abuse; they understand that avoidance of alcohol and tobacco is important to prevent  recurrences as well as other cancers.  I am thrilled that he stopped smoking 2 months ago.  I applauded him on this and recommended that he call the 1 800 QUIT NOW line if he ever needs additional support.  4) Swallowing:   Continue swallowing exercises, denies any issues  5) Dental: Edentulous.    6) Thyroid function:    Continue to follow with PCP, last lab work as below Lab Results  Component Value Date   TSH 2.86 09/06/2021     7) He will continue to follow with our survivorship clinic here at the cancer center.  I will see him back on an as-needed basis if any additional needs arise that the survivorship cannot address . He sees Dr. Redmond Baseman later this month.  He will discuss the sore in his ear canal with Dr. Redmond Baseman as well as his ongoing right jaw pain.  On date of service, in total, I spent 25 minutes on this encounter. Patient was seen in person. _____________________________________   Eppie Gibson, MD.

## 2021-11-15 NOTE — Progress Notes (Signed)
Mr. Brian Dixon presents for follow up of radiation completed on 01/12/2019 to his oropharynx and tongue  Pain issues, if any: Reports constant ear ache to right side; states ear drop will relieve the pain for a couple days, but then pain will return. Also reports pain along the right side of his jaw. Was directed to ED from PCP's office in September, CT scan was taken and patient started on antibiotics (reports he did notice any relief of improvement in to pain/swelling) Using a feeding tube?: N/A Weight changes, if any:  Wt Readings from Last 3 Encounters:  11/15/21 157 lb 4 oz (71.3 kg)  10/09/21 156 lb 6.4 oz (70.9 kg)  09/06/21 151 lb (68.5 kg)   Swallowing issues, if any: Yes--can really only tolerate liquids/soups. Drinks 3 boost supplements a day as well Smoking or chewing tobacco? Reports he quit smoking about 2 months ago Using fluoride trays daily? N/A Last ENT visit was on: Planning to see Dr. Melida Quitter on 11/26/21 Other notable issues, if any: Continues to deal with dry mouth and thick saliva. Reports continued firmness and decreased range of motion to the right side of his neck despite doing her PT exercises regularly.

## 2021-11-18 ENCOUNTER — Telehealth: Payer: Self-pay | Admitting: Adult Health

## 2021-11-18 NOTE — Telephone Encounter (Signed)
Scheduled per 11/18 scheduled message, patient has been called and voicemail was left.

## 2021-11-26 DIAGNOSIS — C109 Malignant neoplasm of oropharynx, unspecified: Secondary | ICD-10-CM | POA: Diagnosis not present

## 2021-12-04 ENCOUNTER — Encounter: Payer: Self-pay | Admitting: Nurse Practitioner

## 2021-12-04 ENCOUNTER — Ambulatory Visit (INDEPENDENT_AMBULATORY_CARE_PROVIDER_SITE_OTHER): Payer: Medicare HMO | Admitting: Nurse Practitioner

## 2021-12-04 ENCOUNTER — Other Ambulatory Visit: Payer: Self-pay

## 2021-12-04 VITALS — BP 115/75 | HR 100 | Temp 98.2°F | Ht 67.0 in | Wt 150.0 lb

## 2021-12-04 DIAGNOSIS — E559 Vitamin D deficiency, unspecified: Secondary | ICD-10-CM

## 2021-12-04 DIAGNOSIS — E785 Hyperlipidemia, unspecified: Secondary | ICD-10-CM | POA: Diagnosis not present

## 2021-12-04 DIAGNOSIS — Z8673 Personal history of transient ischemic attack (TIA), and cerebral infarction without residual deficits: Secondary | ICD-10-CM

## 2021-12-04 DIAGNOSIS — I10 Essential (primary) hypertension: Secondary | ICD-10-CM | POA: Diagnosis not present

## 2021-12-04 DIAGNOSIS — K219 Gastro-esophageal reflux disease without esophagitis: Secondary | ICD-10-CM

## 2021-12-04 DIAGNOSIS — I160 Hypertensive urgency: Secondary | ICD-10-CM

## 2021-12-04 DIAGNOSIS — G629 Polyneuropathy, unspecified: Secondary | ICD-10-CM

## 2021-12-04 DIAGNOSIS — Z760 Encounter for issue of repeat prescription: Secondary | ICD-10-CM | POA: Diagnosis not present

## 2021-12-04 MED ORDER — VITAMIN D (ERGOCALCIFEROL) 1.25 MG (50000 UNIT) PO CAPS: 50000.0000 [IU] | ORAL_CAPSULE | ORAL | 6 refills | Status: DC

## 2021-12-04 MED ORDER — AMLODIPINE BESYLATE 5 MG PO TABS: 5.0000 mg | ORAL_TABLET | Freq: Every day | ORAL | 3 refills | Status: AC

## 2021-12-04 MED ORDER — GABAPENTIN 300 MG PO CAPS: 300.0000 mg | ORAL_CAPSULE | Freq: Two times a day (BID) | ORAL | 3 refills | Status: DC

## 2021-12-04 MED ORDER — ATORVASTATIN CALCIUM 40 MG PO TABS: 40.0000 mg | ORAL_TABLET | Freq: Every evening | ORAL | 3 refills | Status: DC

## 2021-12-04 MED ORDER — OMEPRAZOLE 20 MG PO CPDR: 20.0000 mg | DELAYED_RELEASE_CAPSULE | Freq: Every day | ORAL | 3 refills | Status: DC

## 2021-12-04 MED ORDER — LISINOPRIL 10 MG PO TABS: 10.0000 mg | ORAL_TABLET | Freq: Every day | ORAL | 3 refills | Status: AC

## 2021-12-04 NOTE — Patient Instructions (Signed)
Managing Your Hypertension Hypertension, also called high blood pressure, is when the force of the blood pressing against the walls of the arteries is too strong. Arteries are blood vessels that carry blood from your heart throughout your body. Hypertension forces the heart to work harder to pump blood and may cause the arteries to become narrow or stiff. Understanding blood pressure readings Your personal target blood pressure may vary depending on your medical conditions, your age, and other factors. A blood pressure reading includes a higher number over a lower number. Ideally, your blood pressure should be below 120/80. You should know that: The first, or top, number is called the systolic pressure. It is a measure of the pressure in your arteries as your heart beats. The second, or bottom number, is called the diastolic pressure. It is a measure of the pressure in your arteries as the heart relaxes. Blood pressure is classified into four stages. Based on your blood pressure reading, your health care provider may use the following stages to determine what type of treatment you need, if any. Systolic pressure and diastolic pressure are measured in a unit called mmHg. Normal Systolic pressure: below 120. Diastolic pressure: below 80. Elevated Systolic pressure: 120-129. Diastolic pressure: below 80. Hypertension stage 1 Systolic pressure: 130-139. Diastolic pressure: 80-89. Hypertension stage 2 Systolic pressure: 140 or above. Diastolic pressure: 90 or above. How can this condition affect me? Managing your hypertension is an important responsibility. Over time, hypertension can damage the arteries and decrease blood flow to important parts of the body, including the brain, heart, and kidneys. Having untreated or uncontrolled hypertension can lead to: A heart attack. A stroke. A weakened blood vessel (aneurysm). Heart failure. Kidney damage. Eye damage. Metabolic syndrome. Memory and  concentration problems. Vascular dementia. What actions can I take to manage this condition? Hypertension can be managed by making lifestyle changes and possibly by taking medicines. Your health care provider will help you make a plan to bring your blood pressure within a normal range. Nutrition  Eat a diet that is high in fiber and potassium, and low in salt (sodium), added sugar, and fat. An example eating plan is called the Dietary Approaches to Stop Hypertension (DASH) diet. To eat this way: Eat plenty of fresh fruits and vegetables. Try to fill one-half of your plate at each meal with fruits and vegetables. Eat whole grains, such as whole-wheat pasta, brown rice, or whole-grain bread. Fill about one-fourth of your plate with whole grains. Eat low-fat dairy products. Avoid fatty cuts of meat, processed or cured meats, and poultry with skin. Fill about one-fourth of your plate with lean proteins such as fish, chicken without skin, beans, eggs, and tofu. Avoid pre-made and processed foods. These tend to be higher in sodium, added sugar, and fat. Reduce your daily sodium intake. Most people with hypertension should eat less than 1,500 mg of sodium a day. Lifestyle  Work with your health care provider to maintain a healthy body weight or to lose weight. Ask what an ideal weight is for you. Get at least 30 minutes of exercise that causes your heart to beat faster (aerobic exercise) most days of the week. Activities may include walking, swimming, or biking. Include exercise to strengthen your muscles (resistance exercise), such as weight lifting, as part of your weekly exercise routine. Try to do these types of exercises for 30 minutes at least 3 days a week. Do not use any products that contain nicotine or tobacco, such as cigarettes, e-cigarettes,   and chewing tobacco. If you need help quitting, ask your health care provider. Control any long-term (chronic) conditions you have, such as high  cholesterol or diabetes. Identify your sources of stress and find ways to manage stress. This may include meditation, deep breathing, or making time for fun activities. Alcohol use Do not drink alcohol if: Your health care provider tells you not to drink. You are pregnant, may be pregnant, or are planning to become pregnant. If you drink alcohol: Limit how much you use to: 0-1 drink a day for women. 0-2 drinks a day for men. Be aware of how much alcohol is in your drink. In the [place] one drink equals one 12 oz bottle of beer (355 mL), one 5 oz glass of wine (148 mL), or one 1 oz glass of hard liquor (44 mL). Medicines Your health care provider may prescribe medicine if lifestyle changes are not enough to get your blood pressure under control and if: Your systolic blood pressure is 130 or higher. Your diastolic blood pressure is 80 or higher. Take medicines only as told by your health care provider. Follow the directions carefully. Blood pressure medicines must be taken as told by your health care provider. The medicine does not work as well when you skip doses. Skipping doses also puts you at risk for problems. Monitoring Before you monitor your blood pressure: Do not smoke, drink caffeinated beverages, or exercise within 30 minutes before taking a measurement. Use the bathroom and empty your bladder (urinate). Sit quietly for at least 5 minutes before taking measurements. Monitor your blood pressure at home as told by your health care provider. To do this: Sit with your back straight and supported. Place your feet flat on the floor. Do not cross your legs. Support your arm on a flat surface, such as a table. Make sure your upper arm is at heart level. Each time you measure, take two or three readings one minute apart and record the results. You may also need to have your blood pressure checked regularly by your health care provider. General information Talk with your health care  provider about your diet, exercise habits, and other lifestyle factors that may be contributing to hypertension. Review all the medicines you take with your health care provider because there may be side effects or interactions. Keep all visits as told by your health care provider. Your health care provider can help you create and adjust your plan for managing your high blood pressure. Where to find more information National Heart, Lung, and Blood Institute: www.nhlbi.nih.gov American Heart Association: www.heart.org Contact a health care provider if: You think you are having a reaction to medicines you have taken. You have repeated (recurrent) headaches. You feel dizzy. You have swelling in your ankles. You have trouble with your vision. Get help right away if: You develop a severe headache or confusion. You have unusual weakness or numbness, or you feel faint. You have severe pain in your chest or abdomen. You vomit repeatedly. You have trouble breathing. These symptoms may represent a serious problem that is an emergency. Do not wait to see if the symptoms will go away. Get medical help right away. Call your local emergency services (911 in the [place] Do not drive yourself to the hospital. Summary Hypertension is when the force of blood pumping through your arteries is too strong. If this condition is not controlled, it may put you at risk for serious complications. Your personal target blood pressure may vary depending on   your medical conditions, your age, and other factors. For most people, a normal blood pressure is less than 120/80. Hypertension is managed by lifestyle changes, medicines, or both. Lifestyle changes to help manage hypertension include losing weight, eating a healthy, low-sodium diet, exercising more, stopping smoking, and limiting alcohol. This information is not intended to replace advice given to you by your health care provider. Make sure you discuss any questions  you have with your health care provider. Document Revised: 01/02/2020 Document Reviewed: 11/15/2019 Elsevier Patient Education  2022 Elsevier Inc.  
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
## 2022-01-03 DIAGNOSIS — Z7982 Long term (current) use of aspirin: Secondary | ICD-10-CM | POA: Diagnosis not present

## 2022-01-03 DIAGNOSIS — Z87891 Personal history of nicotine dependence: Secondary | ICD-10-CM | POA: Diagnosis not present

## 2022-01-03 DIAGNOSIS — C109 Malignant neoplasm of oropharynx, unspecified: Secondary | ICD-10-CM | POA: Diagnosis not present

## 2022-01-03 DIAGNOSIS — Z79899 Other long term (current) drug therapy: Secondary | ICD-10-CM | POA: Diagnosis not present

## 2022-01-03 DIAGNOSIS — Z85818 Personal history of malignant neoplasm of other sites of lip, oral cavity, and pharynx: Secondary | ICD-10-CM | POA: Diagnosis not present

## 2022-02-01 DIAGNOSIS — H60501 Unspecified acute noninfective otitis externa, right ear: Secondary | ICD-10-CM | POA: Diagnosis not present

## 2022-02-01 DIAGNOSIS — C029 Malignant neoplasm of tongue, unspecified: Secondary | ICD-10-CM | POA: Diagnosis not present

## 2022-02-01 DIAGNOSIS — H9201 Otalgia, right ear: Secondary | ICD-10-CM | POA: Diagnosis not present

## 2022-02-01 DIAGNOSIS — G5793 Unspecified mononeuropathy of bilateral lower limbs: Secondary | ICD-10-CM | POA: Diagnosis not present

## 2022-02-14 ENCOUNTER — Other Ambulatory Visit: Payer: Self-pay | Admitting: Adult Health

## 2022-02-28 ENCOUNTER — Ambulatory Visit (HOSPITAL_COMMUNITY): Payer: Medicare HMO

## 2022-03-14 ENCOUNTER — Telehealth: Payer: Self-pay | Admitting: Adult Health

## 2022-03-14 ENCOUNTER — Inpatient Hospital Stay: Payer: Medicaid Other | Admitting: Adult Health

## 2022-03-14 NOTE — Telephone Encounter (Signed)
Rescheduled appointment per 3/17 secure chat. Patient is aware of the changes made to his upcoming appointment. ?

## 2022-03-17 ENCOUNTER — Inpatient Hospital Stay: Payer: Medicare HMO | Attending: Adult Health | Admitting: Adult Health
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
## 2022-04-02 DIAGNOSIS — R7309 Other abnormal glucose: Secondary | ICD-10-CM | POA: Diagnosis not present

## 2022-04-02 DIAGNOSIS — M25561 Pain in right knee: Secondary | ICD-10-CM | POA: Diagnosis not present

## 2022-04-02 DIAGNOSIS — Z79899 Other long term (current) drug therapy: Secondary | ICD-10-CM | POA: Diagnosis not present

## 2022-04-02 DIAGNOSIS — G8929 Other chronic pain: Secondary | ICD-10-CM | POA: Diagnosis not present

## 2022-04-02 DIAGNOSIS — M25562 Pain in left knee: Secondary | ICD-10-CM | POA: Diagnosis not present

## 2022-04-02 DIAGNOSIS — E782 Mixed hyperlipidemia: Secondary | ICD-10-CM | POA: Diagnosis not present

## 2022-04-02 DIAGNOSIS — R7989 Other specified abnormal findings of blood chemistry: Secondary | ICD-10-CM | POA: Diagnosis not present

## 2022-04-02 DIAGNOSIS — Z930 Tracheostomy status: Secondary | ICD-10-CM | POA: Diagnosis not present

## 2022-04-02 DIAGNOSIS — I10 Essential (primary) hypertension: Secondary | ICD-10-CM | POA: Diagnosis not present

## 2022-04-02 DIAGNOSIS — R718 Other abnormality of red blood cells: Secondary | ICD-10-CM | POA: Diagnosis not present

## 2022-04-02 DIAGNOSIS — E876 Hypokalemia: Secondary | ICD-10-CM | POA: Diagnosis not present

## 2022-04-14 DIAGNOSIS — Z483 Aftercare following surgery for neoplasm: Secondary | ICD-10-CM | POA: Diagnosis not present

## 2022-04-14 DIAGNOSIS — R6884 Jaw pain: Secondary | ICD-10-CM | POA: Diagnosis not present

## 2022-04-14 DIAGNOSIS — Z923 Personal history of irradiation: Secondary | ICD-10-CM | POA: Diagnosis not present

## 2022-04-14 DIAGNOSIS — Z930 Tracheostomy status: Secondary | ICD-10-CM | POA: Diagnosis not present

## 2022-04-14 DIAGNOSIS — C109 Malignant neoplasm of oropharynx, unspecified: Secondary | ICD-10-CM | POA: Diagnosis not present

## 2022-04-14 DIAGNOSIS — R1312 Dysphagia, oropharyngeal phase: Secondary | ICD-10-CM | POA: Diagnosis not present

## 2022-04-14 DIAGNOSIS — Z9009 Acquired absence of other part of head and neck: Secondary | ICD-10-CM | POA: Diagnosis not present

## 2022-04-23 DIAGNOSIS — Z923 Personal history of irradiation: Secondary | ICD-10-CM | POA: Diagnosis not present

## 2022-04-23 DIAGNOSIS — Z85818 Personal history of malignant neoplasm of other sites of lip, oral cavity, and pharynx: Secondary | ICD-10-CM | POA: Diagnosis not present

## 2022-04-23 DIAGNOSIS — G8929 Other chronic pain: Secondary | ICD-10-CM | POA: Diagnosis not present

## 2022-04-23 DIAGNOSIS — R6884 Jaw pain: Secondary | ICD-10-CM | POA: Diagnosis not present

## 2022-04-23 DIAGNOSIS — Z930 Tracheostomy status: Secondary | ICD-10-CM | POA: Diagnosis not present

## 2022-05-29 DIAGNOSIS — M25561 Pain in right knee: Secondary | ICD-10-CM | POA: Diagnosis not present

## 2022-05-29 DIAGNOSIS — G8929 Other chronic pain: Secondary | ICD-10-CM | POA: Diagnosis not present

## 2022-05-29 DIAGNOSIS — M25562 Pain in left knee: Secondary | ICD-10-CM | POA: Diagnosis not present

## 2022-06-26 DIAGNOSIS — R0602 Shortness of breath: Secondary | ICD-10-CM | POA: Diagnosis not present

## 2022-06-26 DIAGNOSIS — R197 Diarrhea, unspecified: Secondary | ICD-10-CM | POA: Diagnosis not present

## 2022-06-26 DIAGNOSIS — D649 Anemia, unspecified: Secondary | ICD-10-CM | POA: Diagnosis not present

## 2022-06-26 DIAGNOSIS — R634 Abnormal weight loss: Secondary | ICD-10-CM | POA: Diagnosis not present

## 2022-06-26 DIAGNOSIS — E876 Hypokalemia: Secondary | ICD-10-CM | POA: Diagnosis not present

## 2022-06-27 ENCOUNTER — Emergency Department (HOSPITAL_BASED_OUTPATIENT_CLINIC_OR_DEPARTMENT_OTHER)
Admission: EM | Admit: 2022-06-27 | Discharge: 2022-06-27 | Disposition: A | Discharge status: Home / Self Care | Payer: Medicare HMO | Attending: Emergency Medicine | Admitting: Emergency Medicine

## 2022-06-27 ENCOUNTER — Emergency Department (HOSPITAL_BASED_OUTPATIENT_CLINIC_OR_DEPARTMENT_OTHER): Payer: Medicare HMO | Admitting: Radiology

## 2022-06-27 ENCOUNTER — Other Ambulatory Visit: Payer: Self-pay

## 2022-06-27 DIAGNOSIS — R0602 Shortness of breath: Secondary | ICD-10-CM | POA: Diagnosis not present

## 2022-06-27 DIAGNOSIS — Z79899 Other long term (current) drug therapy: Secondary | ICD-10-CM | POA: Insufficient documentation

## 2022-06-27 DIAGNOSIS — N179 Acute kidney failure, unspecified: Secondary | ICD-10-CM | POA: Diagnosis not present

## 2022-06-27 DIAGNOSIS — I10 Essential (primary) hypertension: Secondary | ICD-10-CM | POA: Diagnosis not present

## 2022-06-27 DIAGNOSIS — D649 Anemia, unspecified: Secondary | ICD-10-CM

## 2022-06-27 DIAGNOSIS — Z85810 Personal history of malignant neoplasm of tongue: Secondary | ICD-10-CM | POA: Diagnosis not present

## 2022-06-27 DIAGNOSIS — R5383 Other fatigue: Secondary | ICD-10-CM | POA: Diagnosis not present

## 2022-06-27 DIAGNOSIS — E860 Dehydration: Secondary | ICD-10-CM | POA: Insufficient documentation

## 2022-06-27 DIAGNOSIS — E876 Hypokalemia: Secondary | ICD-10-CM | POA: Diagnosis not present

## 2022-06-27 DIAGNOSIS — R531 Weakness: Secondary | ICD-10-CM | POA: Diagnosis present

## 2022-06-27 LAB — COMPREHENSIVE METABOLIC PANEL
ALT: 29 U/L (ref 0–44)
AST: 40 U/L (ref 15–41)
Albumin: 3.4 g/dL — ABNORMAL LOW (ref 3.5–5.0)
Alkaline Phosphatase: 94 U/L (ref 38–126)
Anion gap: 16 — ABNORMAL HIGH (ref 5–15)
BUN: 35 mg/dL — ABNORMAL HIGH (ref 6–20)
CO2: 24 mmol/L (ref 22–32)
Calcium: 9.7 mg/dL (ref 8.9–10.3)
Chloride: 99 mmol/L (ref 98–111)
Creatinine, Ser: 1.87 mg/dL — ABNORMAL HIGH (ref 0.61–1.24)
GFR, Estimated: 42 mL/min — ABNORMAL LOW (ref >60)
Glucose, Bld: 131 mg/dL — ABNORMAL HIGH (ref 70–99)
Potassium: 3.5 mmol/L (ref 3.5–5.1)
Sodium: 139 mmol/L (ref 135–145)
Total Bilirubin: 1.4 mg/dL — ABNORMAL HIGH (ref 0.3–1.2)
Total Protein: 7.7 g/dL (ref 6.5–8.1)

## 2022-06-27 LAB — CBC WITH DIFFERENTIAL/PLATELET
Abs Immature Granulocytes: 0.05 10*3/uL (ref 0.00–0.07)
Basophils Absolute: 0 10*3/uL (ref 0.0–0.1)
Basophils Relative: 0 %
Eosinophils Absolute: 0 10*3/uL (ref 0.0–0.5)
Eosinophils Relative: 1 %
HCT: 29.8 % — ABNORMAL LOW (ref 39.0–52.0)
Hemoglobin: 10.1 g/dL — ABNORMAL LOW (ref 13.0–17.0)
Immature Granulocytes: 1 %
Lymphocytes Relative: 13 %
Lymphs Abs: 0.7 10*3/uL (ref 0.7–4.0)
MCH: 33.2 pg (ref 26.0–34.0)
MCHC: 33.9 g/dL (ref 30.0–36.0)
MCV: 98 fL (ref 80.0–100.0)
Monocytes Absolute: 0.3 10*3/uL (ref 0.1–1.0)
Monocytes Relative: 6 %
Neutro Abs: 4 10*3/uL (ref 1.7–7.7)
Neutrophils Relative %: 79 %
Platelets: 82 10*3/uL — ABNORMAL LOW (ref 150–400)
RBC: 3.04 MIL/uL — ABNORMAL LOW (ref 4.22–5.81)
RDW: 15.5 % (ref 11.5–15.5)
WBC: 5 10*3/uL (ref 4.0–10.5)
nRBC: 0 % (ref 0.0–0.2)

## 2022-06-27 LAB — LACTIC ACID, PLASMA
Lactic Acid, Venous: 3.3 mmol/L (ref 0.5–1.9)
Lactic Acid, Venous: 2 mmol/L (ref 0.5–1.9)

## 2022-06-27 LAB — MAGNESIUM: Magnesium: 1.2 mg/dL — ABNORMAL LOW (ref 1.7–2.4)

## 2022-06-27 MED ORDER — MAGNESIUM OXIDE 400 MG PO CAPS: 400.0000 mg | ORAL_CAPSULE | Freq: Every day | ORAL | 0 refills | Status: AC

## 2022-06-27 MED ORDER — LACTATED RINGERS IV BOLUS
1000.0000 mL | Freq: Once | INTRAVENOUS | Status: AC
  Administered 2022-06-27: 1000 mL via INTRAVENOUS

## 2022-06-27 MED ORDER — SODIUM CHLORIDE 0.9 % IV BOLUS: 1000.0000 mL | Freq: Once | INTRAVENOUS | Status: DC

## 2022-06-27 MED ORDER — MAGNESIUM SULFATE 2 GM/50ML IV SOLN
2.0000 g | Freq: Once | INTRAVENOUS | Status: AC
Start: 2022-06-27 — End: 2022-06-27
  Administered 2022-06-27: 2 g via INTRAVENOUS
  Filled 2022-06-27: qty 50

## 2022-06-27 MED ORDER — ALBUTEROL SULFATE HFA 108 (90 BASE) MCG/ACT IN AERS: 2.0000 | INHALATION_SPRAY | RESPIRATORY_TRACT | Status: DC | PRN

## 2022-06-27 NOTE — Discharge Instructions (Signed)
We recommended admission and you are leaving against our medical advice.  If you change your mind at any time or if you have any worsening of your symptoms, please come back to ER for reassessment.  Please follow-up with your primary care doctor, request repeat blood work including BMP and magnesium level.  Recommend drinking plenty of fluids, for the next few days I also would recommend taking the magnesium supplement as prescribed.  Discussed with your primary care doctor whether you should stay on this medicine long-term.

## 2022-06-27 NOTE — ED Provider Notes (Signed)
56 year old male presenting to ER due to concern for abnormal labs.  Blood work repeated here is concerning for acute kidney injury, hypomagnesemia.  This is in the setting of recent GI illness.  Patient feeling better today from a GI perspective.  Given these laboratory findings he was advised to be admitted however patient is refusing to be admitted.  Plan at time of signout is to recheck patient after he received some additional fluids and magnesium supplementation.  I reassessed patient.  I rediscussed all of his laboratory findings and advised admission.  I discussed the benefits of admission and discussed the risks of discharge.  Patient demonstrated understanding of my concerns, he has his parents at bedside.  We will have patient's sign out AMA.  I recommended close follow-up with his primary care doctor, repeat labs as outpatient.  Recommended magnesium supplementation for the next few days.    Lucrezia Starch, MD 06/27/22 [phone]

## 2022-06-27 NOTE — ED Notes (Addendum)
Report given Matthew Folks, RN

## 2022-06-27 NOTE — ED Notes (Signed)
Lactic acid 3.3 report to MD

## 2022-06-27 NOTE — ED Notes (Signed)
AMA form signed at this time.

## 2022-06-27 NOTE — ED Triage Notes (Signed)
Patient arrives with complaints of shortness of breath and worsening fatigue x2 weeks. Patient also reports being sent here by his physician for potassium of 2.8 and fluid overload.

## 2022-06-27 NOTE — ED Notes (Signed)
Pt reports sent by PCP for abnormal labs. Pt AAOx4, No distress. denies pain. comfortable at this time.
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
## 2022-07-02 LAB — CULTURE, BLOOD (ROUTINE X 2)
Culture: NO GROWTH
Culture: NO GROWTH
Special Requests: ADEQUATE

## 2022-07-14 ENCOUNTER — Emergency Department (HOSPITAL_COMMUNITY): Payer: Medicare HMO

## 2022-07-14 ENCOUNTER — Other Ambulatory Visit: Payer: Self-pay

## 2022-07-14 ENCOUNTER — Observation Stay (HOSPITAL_COMMUNITY)
Admission: EM | Admit: 2022-07-14 | Discharge: 2022-07-19 | Disposition: A | Discharge status: Skilled Nursing Facility | Payer: Medicare HMO | Attending: Internal Medicine | Admitting: Internal Medicine

## 2022-07-14 ENCOUNTER — Encounter (HOSPITAL_COMMUNITY): Payer: Self-pay | Admitting: Emergency Medicine

## 2022-07-14 DIAGNOSIS — R296 Repeated falls: Secondary | ICD-10-CM | POA: Insufficient documentation

## 2022-07-14 DIAGNOSIS — R000 Tachycardia, unspecified: Secondary | ICD-10-CM | POA: Diagnosis not present

## 2022-07-14 DIAGNOSIS — Z85818 Personal history of malignant neoplasm of other sites of lip, oral cavity, and pharynx: Secondary | ICD-10-CM | POA: Insufficient documentation

## 2022-07-14 DIAGNOSIS — M1611 Unilateral primary osteoarthritis, right hip: Secondary | ICD-10-CM | POA: Diagnosis not present

## 2022-07-14 DIAGNOSIS — S199XXA Unspecified injury of neck, initial encounter: Secondary | ICD-10-CM | POA: Diagnosis not present

## 2022-07-14 DIAGNOSIS — K8020 Calculus of gallbladder without cholecystitis without obstruction: Secondary | ICD-10-CM | POA: Diagnosis not present

## 2022-07-14 DIAGNOSIS — D539 Nutritional anemia, unspecified: Secondary | ICD-10-CM | POA: Insufficient documentation

## 2022-07-14 DIAGNOSIS — Z87891 Personal history of nicotine dependence: Secondary | ICD-10-CM | POA: Diagnosis not present

## 2022-07-14 DIAGNOSIS — E785 Hyperlipidemia, unspecified: Secondary | ICD-10-CM | POA: Diagnosis not present

## 2022-07-14 DIAGNOSIS — Z760 Encounter for issue of repeat prescription: Secondary | ICD-10-CM

## 2022-07-14 DIAGNOSIS — Z85810 Personal history of malignant neoplasm of tongue: Secondary | ICD-10-CM | POA: Diagnosis not present

## 2022-07-14 DIAGNOSIS — R531 Weakness: Secondary | ICD-10-CM | POA: Diagnosis not present

## 2022-07-14 DIAGNOSIS — I10 Essential (primary) hypertension: Secondary | ICD-10-CM | POA: Insufficient documentation

## 2022-07-14 DIAGNOSIS — M5021 Other cervical disc displacement,  high cervical region: Secondary | ICD-10-CM | POA: Diagnosis not present

## 2022-07-14 DIAGNOSIS — N179 Acute kidney failure, unspecified: Secondary | ICD-10-CM | POA: Insufficient documentation

## 2022-07-14 DIAGNOSIS — I700 Atherosclerosis of aorta: Secondary | ICD-10-CM | POA: Diagnosis not present

## 2022-07-14 DIAGNOSIS — W19XXXA Unspecified fall, initial encounter: Secondary | ICD-10-CM | POA: Diagnosis not present

## 2022-07-14 DIAGNOSIS — N281 Cyst of kidney, acquired: Secondary | ICD-10-CM | POA: Diagnosis not present

## 2022-07-14 DIAGNOSIS — Z79899 Other long term (current) drug therapy: Secondary | ICD-10-CM | POA: Diagnosis not present

## 2022-07-14 DIAGNOSIS — Z8673 Personal history of transient ischemic attack (TIA), and cerebral infarction without residual deficits: Secondary | ICD-10-CM | POA: Insufficient documentation

## 2022-07-14 DIAGNOSIS — K219 Gastro-esophageal reflux disease without esophagitis: Secondary | ICD-10-CM | POA: Diagnosis not present

## 2022-07-14 DIAGNOSIS — M2578 Osteophyte, vertebrae: Secondary | ICD-10-CM | POA: Diagnosis not present

## 2022-07-14 DIAGNOSIS — D649 Anemia, unspecified: Secondary | ICD-10-CM | POA: Diagnosis not present

## 2022-07-14 DIAGNOSIS — K5730 Diverticulosis of large intestine without perforation or abscess without bleeding: Secondary | ICD-10-CM | POA: Diagnosis not present

## 2022-07-14 DIAGNOSIS — K760 Fatty (change of) liver, not elsewhere classified: Secondary | ICD-10-CM | POA: Diagnosis not present

## 2022-07-14 DIAGNOSIS — M1612 Unilateral primary osteoarthritis, left hip: Secondary | ICD-10-CM | POA: Diagnosis not present

## 2022-07-14 DIAGNOSIS — G629 Polyneuropathy, unspecified: Secondary | ICD-10-CM

## 2022-07-14 LAB — COMPREHENSIVE METABOLIC PANEL
ALT: 16 U/L (ref 0–44)
AST: 27 U/L (ref 15–41)
Albumin: 2.9 g/dL — ABNORMAL LOW (ref 3.5–5.0)
Alkaline Phosphatase: 113 U/L (ref 38–126)
Anion gap: 14 (ref 5–15)
BUN: 15 mg/dL (ref 6–20)
CO2: 17 mmol/L — ABNORMAL LOW (ref 22–32)
Calcium: 9.3 mg/dL (ref 8.9–10.3)
Chloride: 106 mmol/L (ref 98–111)
Creatinine, Ser: 1.3 mg/dL — ABNORMAL HIGH (ref 0.61–1.24)
GFR, Estimated: >60 mL/min (ref >60)
Glucose, Bld: 125 mg/dL — ABNORMAL HIGH (ref 70–99)
Potassium: 3.6 mmol/L (ref 3.5–5.1)
Sodium: 137 mmol/L (ref 135–145)
Total Bilirubin: 1.8 mg/dL — ABNORMAL HIGH (ref 0.3–1.2)
Total Protein: 7.7 g/dL (ref 6.5–8.1)

## 2022-07-14 LAB — I-STAT CHEM 8, ED
BUN: 11 mg/dL (ref 6–20)
Calcium, Ion: 1.12 mmol/L — ABNORMAL LOW (ref 1.15–1.40)
Chloride: 104 mmol/L (ref 98–111)
Creatinine, Ser: 1 mg/dL (ref 0.61–1.24)
Glucose, Bld: 116 mg/dL — ABNORMAL HIGH (ref 70–99)
HCT: 29 % — ABNORMAL LOW (ref 39.0–52.0)
Hemoglobin: 9.9 g/dL — ABNORMAL LOW (ref 13.0–17.0)
Potassium: 3.2 mmol/L — ABNORMAL LOW (ref 3.5–5.1)
Sodium: 137 mmol/L (ref 135–145)
TCO2: 18 mmol/L — ABNORMAL LOW (ref 22–32)

## 2022-07-14 LAB — CBC WITH DIFFERENTIAL/PLATELET
Abs Immature Granulocytes: 0.08 10*3/uL — ABNORMAL HIGH (ref 0.00–0.07)
Basophils Absolute: 0 10*3/uL (ref 0.0–0.1)
Basophils Relative: 0 %
Eosinophils Absolute: 0.1 10*3/uL (ref 0.0–0.5)
Eosinophils Relative: 1 %
HCT: 30 % — ABNORMAL LOW (ref 39.0–52.0)
Hemoglobin: 9.5 g/dL — ABNORMAL LOW (ref 13.0–17.0)
Immature Granulocytes: 1 %
Lymphocytes Relative: 15 %
Lymphs Abs: 1.3 10*3/uL (ref 0.7–4.0)
MCH: 32.6 pg (ref 26.0–34.0)
MCHC: 31.7 g/dL (ref 30.0–36.0)
MCV: 103.1 fL — ABNORMAL HIGH (ref 80.0–100.0)
Monocytes Absolute: 0.8 10*3/uL (ref 0.1–1.0)
Monocytes Relative: 9 %
Neutro Abs: 6.6 10*3/uL (ref 1.7–7.7)
Neutrophils Relative %: 74 %
Platelets: 232 10*3/uL (ref 150–400)
RBC: 2.91 MIL/uL — ABNORMAL LOW (ref 4.22–5.81)
RDW: 15.9 % — ABNORMAL HIGH (ref 11.5–15.5)
WBC: 8.9 10*3/uL (ref 4.0–10.5)
nRBC: 0 % (ref 0.0–0.2)

## 2022-07-14 LAB — URINALYSIS, ROUTINE W REFLEX MICROSCOPIC
Bilirubin Urine: NEGATIVE
Glucose, UA: NEGATIVE mg/dL
Hgb urine dipstick: NEGATIVE
Ketones, ur: NEGATIVE mg/dL
Leukocytes,Ua: NEGATIVE
Nitrite: NEGATIVE
Protein, ur: NEGATIVE mg/dL
Specific Gravity, Urine: 1.015 (ref 1.005–1.030)
pH: 6 (ref 5.0–8.0)

## 2022-07-14 LAB — RAPID URINE DRUG SCREEN, HOSP PERFORMED
Amphetamines: NOT DETECTED
Barbiturates: NOT DETECTED
Benzodiazepines: NOT DETECTED
Cocaine: NOT DETECTED
Opiates: NOT DETECTED
Tetrahydrocannabinol: NOT DETECTED

## 2022-07-14 LAB — AMMONIA: Ammonia: 25 umol/L (ref 9–35)

## 2022-07-14 LAB — MAGNESIUM: Magnesium: 1.4 mg/dL — ABNORMAL LOW (ref 1.7–2.4)

## 2022-07-14 LAB — ETHANOL: Alcohol, Ethyl (B): <10 mg/dL (ref <10)

## 2022-07-14 MED ORDER — ACETAMINOPHEN 650 MG RE SUPP: 650.0000 mg | Freq: Four times a day (QID) | RECTAL | Status: DC | PRN

## 2022-07-14 MED ORDER — ACETAMINOPHEN 325 MG PO TABS
650.0000 mg | ORAL_TABLET | Freq: Four times a day (QID) | ORAL | Status: DC | PRN
  Administered 2022-07-16 – 2022-07-18 (×2): 650 mg via ORAL
  Filled 2022-07-14 (×2): qty 2

## 2022-07-14 MED ORDER — MAGNESIUM SULFATE 2 GM/50ML IV SOLN
2.0000 g | Freq: Once | INTRAVENOUS | Status: AC
  Administered 2022-07-14: 2 g via INTRAVENOUS
  Filled 2022-07-14: qty 50

## 2022-07-14 MED ORDER — ONDANSETRON HCL 4 MG/2ML IJ SOLN
4.0000 mg | Freq: Once | INTRAMUSCULAR | Status: AC
  Administered 2022-07-14: 4 mg via INTRAVENOUS
  Filled 2022-07-14: qty 2

## 2022-07-14 MED ORDER — IOHEXOL 300 MG/ML  SOLN
100.0000 mL | Freq: Once | INTRAMUSCULAR | Status: AC | PRN
  Administered 2022-07-14: 100 mL via INTRAVENOUS

## 2022-07-14 NOTE — ED Notes (Signed)
Received verbal report from [place] at this time
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
## 2022-07-14 NOTE — ED Provider Notes (Signed)
[place] EMERGENCY DEPARTMENT Provider Note   CSN: [id number] Arrival date & time: 07/14/22  1209     History {Add pertinent medical, surgical, social history, OB history to HPI:1} Chief Complaint  Patient presents with   Brian Dixon is a 56 y.o. male.   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  At that time patient had been having 1 to 2 weeks of diarrhea with associated nausea and vomiting.  He had unintentional weight loss of around 20 pounds and had been taking Pepto-Bismol but was instructed to start Imodium in the setting of his diarrheal symptoms.  He was having self-described black stools at that time but felt that it was possibly secondary to taking Pepto-Bismol.  In the setting of no improvement patient ultimately came to the emergency department on 06/27/2022 in the setting of AKI, hypomagnesemia in the setting of a recent GI illness.     Home Medications Prior to Admission medications   Medication Sig Start Date End Date Taking? Authorizing Provider  amLODipine (NORVASC) 5 MG tablet Take 1 tablet (5 mg total) by mouth daily for 365 doses. 12/04/21 12/04/22  Vevelyn Francois, NP  antiseptic oral rinse (BIOTENE) LIQD 15 mLs by Mouth Rinse Dixon as needed for dry mouth.    [provider]  atorvastatin (LIPITOR) 40 MG tablet Take 1 tablet (40 mg total) by mouth every evening. 12/04/21   Vevelyn Francois, NP  Dentifrices (BIOTENE DRY MOUTH GENTLE) PSTE Place 1 application onto teeth in the morning and at bedtime.    [provider]  dextromethorphan-guaiFENesin (MUCINEX DM) 30-600 MG 12hr tablet Take 1 tablet by mouth at  bedtime.    [provider]  Ensure (ENSURE) Take 237 mLs by mouth 3 (three) times daily between meals.    [provider]  gabapentin (NEURONTIN) 300 MG capsule Take 1 capsule (300 mg total) by mouth 2 (two) times daily. 12/04/21   Vevelyn Francois, NP  Glycerin-Hypromellose-PEG 400 (DRY EYE RELIEF DROPS) 0.2-0.2-1 % SOLN Place 1 drop into both eyes 3 (three) times daily as needed (dry/irritated eyes.).    [provider]  lisinopril (ZESTRIL) 10 MG tablet Take 1 tablet (10 mg total) by mouth daily. 12/04/21 12/04/22  Vevelyn Francois, NP  Menthol-Camphor (ICY HOT ADVANCED RELIEF) 16-11 % CREA Apply 1 application topically 3 (three) times daily as needed (pain).    [provider]  omeprazole (PRILOSEC) 20 MG capsule Take 1 capsule (20 mg total) by mouth daily. 12/04/21   Vevelyn Francois, NP  potassium chloride SA (KLOR-CON) 10 MEQ tablet Take 2 tablets (20 mEq total) by mouth daily. 09/06/21   Wendie Agreste, MD  thiamine (VITAMIN B-1) 100 MG tablet Take 1 tablet (100 mg total) by mouth daily. 10/23/19   Palumbo, April, MD  Vitamin D, Ergocalciferol, (DRISDOL) 1.25 MG (50000 UNIT) CAPS capsule Take 1 capsule (50,000 Units total) by mouth every 7 (seven) days. 12/04/21   Vevelyn Francois, NP  prochlorperazine (COMPAZINE) 10 MG tablet Take 1 tablet (10 mg total) by mouth every 6 (six) hours as needed (Nausea or vomiting). 09/15/18 09/15/18  Tish Men, MD  Allergies    Shellfish allergy    Review of Systems   Review of Systems  Physical Exam Updated Vital Signs BP (!) 151/105   Pulse (!) 103   Temp 98.6 F (37 C) (Oral)   Resp 16   SpO2 100%  Physical Exam  ED Results / Procedures / Treatments   Labs (all labs ordered are listed, but only abnormal results are displayed) Labs Reviewed  CBC WITH DIFFERENTIAL/PLATELET - Abnormal; Notable for the following components:      Result Value   RBC 2.91 (*)    Hemoglobin 9.5 (*)    HCT 30.0 (*)    MCV 103.1  (*)    RDW 15.9 (*)    Abs Immature Granulocytes 0.08 (*)    All other components within normal limits  COMPREHENSIVE METABOLIC PANEL - Abnormal; Notable for the following components:   CO2 17 (*)    Glucose, Bld 125 (*)    Creatinine, Ser 1.30 (*)    Albumin 2.9 (*)    Total Bilirubin 1.8 (*)    All other components within normal limits  MAGNESIUM - Abnormal; Notable for the following components:   Magnesium 1.4 (*)    All other components within normal limits  I-STAT CHEM 8, ED - Abnormal; Notable for the following components:   Potassium 3.2 (*)    Glucose, Bld 116 (*)    Calcium, Ion 1.12 (*)    TCO2 18 (*)    Hemoglobin 9.9 (*)    HCT 29.0 (*)    All other components within normal limits  AMMONIA  ETHANOL  URINALYSIS, ROUTINE W REFLEX MICROSCOPIC  RAPID URINE DRUG SCREEN, HOSP PERFORMED    EKG None  Radiology DG HIP UNILAT WITH PELVIS 2-3 VIEWS LEFT  Result Date: 07/14/2022 CLINICAL DATA:  A 56 year old male presents for evaluation of hip pain post fall. EXAM: DG HIP (WITH OR WITHOUT PELVIS) 2-3V LEFT COMPARISON:  Pelvis evaluation and RIGHT hip evaluation of the same date. FINDINGS: Degenerative changes about the LEFT hip. No signs of fracture. No acute bony abnormality. IMPRESSION: Degenerative changes without fracture of the LEFT hip. Dilated loops of small bowel in the lower abdomen on previous pelvic evaluation. Based on degree of distension could consider dedicated abdominal imaging for further evaluation. Electronically Signed   By: Zetta Bills M.D.   On: 07/14/2022 13:56   DG HIP UNILAT WITH PELVIS 2-3 VIEWS RIGHT  Result Date: 07/14/2022 CLINICAL DATA:  A 56 year old male presents for evaluation of bilateral hip pain. EXAM: DG HIP (WITH OR WITHOUT PELVIS) 2-3V RIGHT COMPARISON:  PET evaluation from 2019. FINDINGS: Dilated loops of bowel are noted as an incidental finding in the lower abdomen. No signs of fracture or bony lesion of the bony pelvis. Degenerative  changes of bilateral hips. Soft tissues are unremarkable. RIGHT hip is located without signs of fracture. IMPRESSION: 1. No acute bony abnormality of the pelvis or right hip. 2. Degenerative changes of bilateral hips. 3. Incidental note is made of dilated loops of bowel in the lower abdomen. Correlate with any abdominal symptoms for which additional imaging may be warranted. Dilated loops are seen up to 3.8 cm which could reflect marked ileus or bowel obstruction. Electronically Signed   By: Zetta Bills M.D.   On: 07/14/2022 13:54   CT Cervical Spine Wo Contrast  Result Date: 07/14/2022 CLINICAL DATA:  Multiple recent falls.  Trauma to the head and neck. EXAM: CT CERVICAL SPINE WITHOUT CONTRAST TECHNIQUE: Multidetector CT imaging  of the cervical spine was performed without intravenous contrast. Multiplanar CT image reconstructions were also generated. RADIATION DOSE REDUCTION: This exam was performed according to the departmental dose-optimization program which includes automated exposure control, adjustment of the mA and/or kV according to patient size and/or use of iterative reconstruction technique. COMPARISON:  10/17/2006 FINDINGS: Alignment: Mild scoliotic curvature. Straightening of the normal cervical lordosis. Skull base and vertebrae: No regional fracture or focal lesion. Chronic degenerative changes as below. Soft tissues and spinal canal: Negative except for previous extensive surgical changes of resection, reconstruction and neck dissection. Disc levels: The foramen magnum is widely patent. There is ordinary mild osteoarthritis of the C1-2 articulation but no encroachment upon the neural structures. C2-3: Negative C3-4: Bulging of the disc. Bilateral facet osteoarthritis. Foraminal narrowing on the left. C4-5: Disc space narrowing with uncovertebral osteophytes. Facet degeneration worse on the left. Foraminal narrowing on the left. C5-6: Spondylosis with endplate osteophytes and bulging disc  material. Bilateral foraminal stenosis right worse than left. C6-7: Spondylosis with endplate osteophytes and bulging disc material. Bilateral foraminal stenosis left worse than right. C7-T1: Mild facet arthritis.  No stenosis. Upper chest: Negative Other: None IMPRESSION: No acute or traumatic finding. Chronic degenerative changes as outlined above. No compressive central canal stenosis is identified. Bony foraminal stenosis at multiple levels as above. Electronically Signed   By: Nelson Chimes M.D.   On: 07/14/2022 13:48   CT Head Wo Contrast  Result Date: 07/14/2022 CLINICAL DATA:  Multiple falls.  Head trauma.  Weakness. EXAM: CT HEAD WITHOUT CONTRAST TECHNIQUE: Contiguous axial images were obtained from the base of the skull through the vertex without intravenous contrast. RADIATION DOSE REDUCTION: This exam was performed according to the departmental dose-optimization program which includes automated exposure control, adjustment of the mA and/or kV according to patient size and/or use of iterative reconstruction technique. COMPARISON:  08/05/2018 FINDINGS: Brain: Chronic small-vessel changes are seen throughout the pons. No focal cerebellar finding. Cerebral hemispheres similarly show chronic small-vessel ischemic changes throughout the white matter no sign of acute infarction, mass lesion, hemorrhage, hydrocephalus or extra-axial collection. Vascular: There is atherosclerotic calcification of the major vessels at the base of the brain. Skull: Negative Sinuses/Orbits: Clear/normal Other: None IMPRESSION: Generalized atrophy. Chronic small-vessel ischemic changes of the pons and cerebral hemispheric white matter. No acute or reversible finding. No traumatic finding. Electronically Signed   By: Nelson Chimes M.D.   On: 07/14/2022 13:45    Procedures Procedures  {Document cardiac monitor, telemetry assessment procedure when appropriate:1}  Medications Ordered in ED Medications - No data to display  ED  Course/ Medical Decision Making/ A&P                           Medical Decision Making  ***  {Document critical care time when appropriate:1} {Document review of labs and clinical decision tools ie heart score, Chads2Vasc2 etc:1}  {Document your independent review of radiology images, and any outside records:1} {Document your discussion with family members, caretakers, and with consultants:1} {Document social determinants of health affecting pt's care:1} {Document your decision making why or why not admission, treatments were needed:1} Final Clinical Impression(s) / ED Diagnoses Final diagnoses:  None    Rx / DC Orders ED Discharge Orders     None

## 2022-07-14 NOTE — ED Provider Triage Note (Signed)
Emergency Medicine Provider Triage Evaluation Note  Brian Dixon , a 56 y.o. male  was evaluated in triage.  Pt complains of full body pains yesterday. He denies any fall, although the triage note states he has a fall. The patient then mentions something about a fall yesterday and that he was seen at [facility]. There are no records on care everywhere of him being at [facility] recently. Patient has h/o stroke.    Called mother and she reports that he has been falling 2-3 times a day for the past week.  Mother reports that he was not at [facility] yesterday. Mom reports that he has been more confused lately, but has been transitory. She reports this has been for the last few days. She reports that since he declined admission "everything has gone downhill". She reports that he is unable to even take himself to the bathroom.  Review of Systems  Positive:  Negative:   Physical Exam  BP 128/81 (BP Location: Left Arm)   Pulse (!) 117   Temp 98.6 F (37 C) (Oral)   Resp 15   SpO2 100%  Gen:   Awake, no distress   Resp:  Normal effort  MSK:   Moves extremities without difficulty  Other:  Patient is A&Ox2, not oriented to time and thinks it is 1983. PERRL.   Medical Decision Making  Medically screening exam initiated at 1:08 PM.  Appropriate orders placed.  Erline Hau was informed that the remainder of the evaluation will be completed by another provider, this initial triage assessment does not replace that evaluation, and the importance of remaining in the ED until their evaluation is complete.  Will order head and neck CT based on questionable fall. The patient was recently seen and left AMA with multiple lab abnormalities. Given the AMS, asked nursing to placed him as a level 2 and will need room immediately.    Sherrell Puller, PA-C 07/14/22 1322

## 2022-07-15 ENCOUNTER — Inpatient Hospital Stay (HOSPITAL_COMMUNITY): Payer: Medicare HMO

## 2022-07-15 DIAGNOSIS — E785 Hyperlipidemia, unspecified: Secondary | ICD-10-CM | POA: Diagnosis not present

## 2022-07-15 DIAGNOSIS — R531 Weakness: Secondary | ICD-10-CM | POA: Diagnosis not present

## 2022-07-15 DIAGNOSIS — N179 Acute kidney failure, unspecified: Secondary | ICD-10-CM | POA: Diagnosis present

## 2022-07-15 DIAGNOSIS — K2100 Gastro-esophageal reflux disease with esophagitis, without bleeding: Secondary | ICD-10-CM

## 2022-07-15 DIAGNOSIS — R296 Repeated falls: Secondary | ICD-10-CM | POA: Diagnosis not present

## 2022-07-15 DIAGNOSIS — D539 Nutritional anemia, unspecified: Secondary | ICD-10-CM

## 2022-07-15 DIAGNOSIS — D649 Anemia, unspecified: Secondary | ICD-10-CM | POA: Diagnosis present

## 2022-07-15 DIAGNOSIS — K219 Gastro-esophageal reflux disease without esophagitis: Secondary | ICD-10-CM | POA: Diagnosis present

## 2022-07-15 LAB — COMPREHENSIVE METABOLIC PANEL
ALT: 17 U/L (ref 0–44)
AST: 28 U/L (ref 15–41)
Albumin: 2.8 g/dL — ABNORMAL LOW (ref 3.5–5.0)
Alkaline Phosphatase: 109 U/L (ref 38–126)
Anion gap: 11 (ref 5–15)
BUN: 13 mg/dL (ref 6–20)
CO2: 21 mmol/L — ABNORMAL LOW (ref 22–32)
Calcium: 9.3 mg/dL (ref 8.9–10.3)
Chloride: 105 mmol/L (ref 98–111)
Creatinine, Ser: 1.12 mg/dL (ref 0.61–1.24)
GFR, Estimated: >60 mL/min (ref >60)
Glucose, Bld: 97 mg/dL (ref 70–99)
Potassium: 3.8 mmol/L (ref 3.5–5.1)
Sodium: 137 mmol/L (ref 135–145)
Total Bilirubin: 1.8 mg/dL — ABNORMAL HIGH (ref 0.3–1.2)
Total Protein: 7.6 g/dL (ref 6.5–8.1)

## 2022-07-15 LAB — PROTIME-INR
INR: 1.3 — ABNORMAL HIGH (ref 0.8–1.2)
INR: 1.3 — ABNORMAL HIGH (ref 0.8–1.2)
Prothrombin Time: 16.2 seconds — ABNORMAL HIGH (ref 11.4–15.2)
Prothrombin Time: 16.4 seconds — ABNORMAL HIGH (ref 11.4–15.2)

## 2022-07-15 LAB — RETICULOCYTES
Immature Retic Fract: 23.2 % — ABNORMAL HIGH (ref 2.3–15.9)
RBC.: 2.71 MIL/uL — ABNORMAL LOW (ref 4.22–5.81)
Retic Count, Absolute: 91.9 10*3/uL (ref 19.0–186.0)
Retic Ct Pct: 3.4 % — ABNORMAL HIGH (ref 0.4–3.1)

## 2022-07-15 LAB — TYPE AND SCREEN
ABO/RH(D): A POS
Antibody Screen: NEGATIVE

## 2022-07-15 LAB — MAGNESIUM: Magnesium: 2.4 mg/dL (ref 1.7–2.4)

## 2022-07-15 LAB — CBC WITH DIFFERENTIAL/PLATELET
Abs Immature Granulocytes: 0.04 10*3/uL (ref 0.00–0.07)
Basophils Absolute: 0 10*3/uL (ref 0.0–0.1)
Basophils Relative: 1 %
Eosinophils Absolute: 0.1 10*3/uL (ref 0.0–0.5)
Eosinophils Relative: 2 %
HCT: 26.7 % — ABNORMAL LOW (ref 39.0–52.0)
Hemoglobin: 8.9 g/dL — ABNORMAL LOW (ref 13.0–17.0)
Immature Granulocytes: 1 %
Lymphocytes Relative: 20 %
Lymphs Abs: 1.2 10*3/uL (ref 0.7–4.0)
MCH: 33 pg (ref 26.0–34.0)
MCHC: 33.3 g/dL (ref 30.0–36.0)
MCV: 98.9 fL (ref 80.0–100.0)
Monocytes Absolute: 0.5 10*3/uL (ref 0.1–1.0)
Monocytes Relative: 8 %
Neutro Abs: 3.9 10*3/uL (ref 1.7–7.7)
Neutrophils Relative %: 68 %
Platelets: 183 10*3/uL (ref 150–400)
RBC: 2.7 MIL/uL — ABNORMAL LOW (ref 4.22–5.81)
RDW: 16.2 % — ABNORMAL HIGH (ref 11.5–15.5)
WBC: 5.7 10*3/uL (ref 4.0–10.5)
nRBC: 0 % (ref 0.0–0.2)

## 2022-07-15 LAB — ABO/RH: ABO/RH(D): A POS

## 2022-07-15 LAB — FERRITIN: Ferritin: 1272 ng/mL — ABNORMAL HIGH (ref 24–336)

## 2022-07-15 LAB — TECHNOLOGIST SMEAR REVIEW: Plt Morphology: NORMAL

## 2022-07-15 LAB — CK: Total CK: 216 U/L (ref 49–397)

## 2022-07-15 LAB — FOLATE: Folate: 8.3 ng/mL (ref >5.9)

## 2022-07-15 LAB — IRON AND TIBC
Iron: 39 ug/dL — ABNORMAL LOW (ref 45–182)
Saturation Ratios: 19 % (ref 17.9–39.5)
TIBC: 209 ug/dL — ABNORMAL LOW (ref 250–450)
UIBC: 170 ug/dL

## 2022-07-15 LAB — TSH: TSH: 1.501 u[IU]/mL (ref 0.350–4.500)

## 2022-07-15 LAB — BILIRUBIN, DIRECT: Bilirubin, Direct: 0.5 mg/dL — ABNORMAL HIGH (ref 0.0–0.2)

## 2022-07-15 MED ORDER — POTASSIUM CHLORIDE CRYS ER 20 MEQ PO TBCR
40.0000 meq | EXTENDED_RELEASE_TABLET | Freq: Two times a day (BID) | ORAL | Status: AC
  Administered 2022-07-15 – 2022-07-16 (×2): 40 meq via ORAL
  Filled 2022-07-15 (×2): qty 2

## 2022-07-15 MED ORDER — AMLODIPINE BESYLATE 5 MG PO TABS
5.0000 mg | ORAL_TABLET | Freq: Every day | ORAL | Status: DC
  Administered 2022-07-15 – 2022-07-16 (×2): 5 mg via ORAL
  Filled 2022-07-15 (×2): qty 1

## 2022-07-15 MED ORDER — THIAMINE HCL 100 MG/ML IJ SOLN
100.0000 mg | Freq: Every day | INTRAMUSCULAR | Status: DC
  Filled 2022-07-15: qty 2

## 2022-07-15 MED ORDER — ATORVASTATIN CALCIUM 40 MG PO TABS
40.0000 mg | ORAL_TABLET | Freq: Every evening | ORAL | Status: DC
  Administered 2022-07-15 – 2022-07-18 (×4): 40 mg via ORAL
  Filled 2022-07-15 (×4): qty 1

## 2022-07-15 MED ORDER — LORAZEPAM 2 MG/ML IJ SOLN: 1.0000 mg | INTRAMUSCULAR | Status: AC | PRN

## 2022-07-15 MED ORDER — SODIUM CHLORIDE 0.9 % IV SOLN: INTRAVENOUS | Status: DC

## 2022-07-15 MED ORDER — PANTOPRAZOLE SODIUM 40 MG PO TBEC
40.0000 mg | DELAYED_RELEASE_TABLET | Freq: Every day | ORAL | Status: DC
  Administered 2022-07-15 – 2022-07-19 (×5): 40 mg via ORAL
  Filled 2022-07-15 (×5): qty 1

## 2022-07-15 MED ORDER — LORAZEPAM 2 MG/ML IJ SOLN
0.5000 mg | Freq: Once | INTRAMUSCULAR | Status: AC | PRN
  Administered 2022-07-15: 0.5 mg via INTRAVENOUS
  Filled 2022-07-15: qty 1

## 2022-07-15 MED ORDER — MAGNESIUM SULFATE 2 GM/50ML IV SOLN
2.0000 g | Freq: Once | INTRAVENOUS | Status: AC
Start: 2022-07-15 — End: 2022-07-15
  Administered 2022-07-15: 2 g via INTRAVENOUS
  Filled 2022-07-15: qty 50

## 2022-07-15 MED ORDER — ADULT MULTIVITAMIN W/MINERALS CH
1.0000 | ORAL_TABLET | Freq: Every day | ORAL | Status: DC
  Administered 2022-07-15 – 2022-07-19 (×5): 1 via ORAL
  Filled 2022-07-15 (×5): qty 1

## 2022-07-15 MED ORDER — LORAZEPAM 1 MG PO TABS
1.0000 mg | ORAL_TABLET | ORAL | Status: AC | PRN
  Administered 2022-07-16: 1 mg via ORAL
  Filled 2022-07-15: qty 1

## 2022-07-15 MED ORDER — THIAMINE HCL 100 MG PO TABS
100.0000 mg | ORAL_TABLET | Freq: Every day | ORAL | Status: DC
  Administered 2022-07-15: 100 mg via ORAL
  Filled 2022-07-15: qty 1

## 2022-07-15 MED ORDER — FOLIC ACID 1 MG PO TABS
1.0000 mg | ORAL_TABLET | Freq: Every day | ORAL | Status: DC
  Administered 2022-07-15 – 2022-07-19 (×5): 1 mg via ORAL
  Filled 2022-07-15 (×5): qty 1

## 2022-07-15 MED ORDER — THIAMINE HCL 100 MG PO TABS
100.0000 mg | ORAL_TABLET | Freq: Every day | ORAL | Status: DC
  Administered 2022-07-16 – 2022-07-19 (×4): 100 mg via ORAL
  Filled 2022-07-15 (×5): qty 1

## 2022-07-15 NOTE — Care Management Obs Status (Signed)
[facility] NOTIFICATION   Patient Details  Name: Brian Dixon MRN: [id number] Date of Birth: Jul 31, 1966   Medicare Observation Status Notification Given:  Ernesta Amble, RN 07/15/2022, 3:54 PM

## 2022-07-15 NOTE — Care Management CC44 (Signed)
Condition Code 44 Documentation Completed  Patient Details  Name: TRUXTON STUPKA MRN: [id number] Date of Birth: 09-Dec-1966   Condition Code 44 given:  Yes Patient signature on Condition Code 44 notice:  Yes Documentation of 2 MD's agreement:  Yes Code 44 added to claim:  Yes    Laurena Slimmer, RN 07/15/2022, 3:54 PM

## 2022-07-15 NOTE — Progress Notes (Signed)
SLP Cancellation Note  Patient Details Name: Brian Dixon MRN: [id number] DOB: 08-07-1966   Cancelled treatment:       Reason Eval/Treat Not Completed: Patient at procedure or test/unavailable   Tedra Coppernoll, Katherene Ponto 07/15/2022, 3:14 PM

## 2022-07-15 NOTE — ED Notes (Signed)
Pt on call bell entered room and found pt had removed all monitor cables at this time

## 2022-07-15 NOTE — Progress Notes (Signed)
Physical Therapy Evaluation Patient Details Name: Brian Dixon MRN: [id number] DOB: 28-Sep-1966 Today's Date: 07/15/2022  History of Present Illness  56 yo male with onset of falls and inability to walk was admitted on 7/17 with a 1.5 week history of these issues.  Pt is reporting mult LE joints are painful, has anemia, mult falls a day and has managed at home despite the lack of help previously.  PMHx:  Squamous cell CA of head and neck, swallowing changes, elevated liver enzymes, GERD, colitis, insomnia, stroke, diverticulosis, thrombocytopenia, back surgery, stroke  Clinical Impression  Pt was seen for progression of mobility on side of bed, but  could not tolerate being stood up or attempting to do more than dangle on side of bed.  Pt is demonstrating a difficulty with both strength and control of endurance, requiring a hand on his back to avoid losing balance backward.  He is motivated to try in spite of limitations, and therefore recommend him to rehab care with specific recommendations for CIR.  He can get help from his parents per their report, and has excellent prior level of function.  Follow acutely for goals of PT.     Recommendations for follow up therapy are one component of a multi-disciplinary discharge planning process, led by the attending physician.  Recommendations may be updated based on patient status, additional functional criteria and insurance authorization.  Follow Up Recommendations Acute inpatient rehab (3hours/day)      Assistance Recommended at Discharge Frequent or constant Supervision/Assistance  Patient can return home with the following  A lot of help with walking and/or transfers;A lot of help with bathing/dressing/bathroom;Assistance with cooking/housework;Assist for transportation;Help with stairs or ramp for entrance    Equipment Recommendations None recommended by PT;Other (comment) (allow rehab venue to determine)  Recommendations for Other Services   Rehab consult    Functional Status Assessment Patient has had a recent decline in their functional status and demonstrates the ability to make significant improvements in function in a reasonable and predictable amount of time.     Precautions / Restrictions Precautions Precautions: Fall;Back Precaution Comments: past history Restrictions Weight Bearing Restrictions: No      Mobility  Bed Mobility Overal bed mobility: Needs Assistance Bed Mobility: Supine to Sit, Sit to Supine     Supine to sit: Mod assist Sit to supine: Mod assist   General bed mobility comments: mod assist to get trunk off bed and to return to supine    Transfers Overall transfer level: Needs assistance                 General transfer comment: pt declined to attempt due to feeling light headed    Ambulation/Gait               General Gait Details: deferred  Stairs            Wheelchair Mobility    Modified Rankin (Stroke Patients Only)       Balance Overall balance assessment: History of Falls, Needs assistance Sitting-balance support: Feet supported, Single extremity supported Sitting balance-Leahy Scale: Fair                                       Pertinent Vitals/Pain Pain Assessment Pain Assessment: No/denies pain    Home Living Family/patient expects to be discharged to:: Inpatient rehab  Additional Comments: hoping for the admission as his parents can assist him    Prior Function Prior Level of Function : Independent/Modified Independent             Mobility Comments: used SPC previously ADLs Comments: I for self care     Hand Dominance   Dominant Hand: Right    Extremity/Trunk Assessment   Upper Extremity Assessment Upper Extremity Assessment: Generalized weakness    Lower Extremity Assessment Lower Extremity Assessment: Generalized weakness;LLE deficits/detail LLE Deficits / Details: 4- strength  overall    Cervical / Trunk Assessment Cervical / Trunk Assessment: Back Surgery (previously)  Communication   Communication: Expressive difficulties  Cognition Arousal/Alertness: Awake/alert Behavior During Therapy: Flat affect Overall Cognitive Status: Difficult to assess                                 General Comments: pt is fairly quiet and letting his parents answer for him        General Comments General comments (skin integrity, edema, etc.): pt was assessed for vitals with no abnormal values for sats or pulse.    Exercises     Assessment/Plan    PT Assessment Patient needs continued PT services  PT Problem List Decreased strength;Decreased activity tolerance;Decreased balance;Decreased mobility;Decreased coordination;Decreased cognition;Decreased knowledge of use of DME;Decreased safety awareness       PT Treatment Interventions DME instruction;Gait training;Stair training;Functional mobility training;Therapeutic activities;Therapeutic exercise;Balance training;Neuromuscular re-education;Patient/family education    PT Goals (Current goals can be found in the Care Plan section)  Acute Rehab PT Goals Patient Stated Goal: none stated PT Goal Formulation: With family Time For Goal Achievement: 07/29/22 Potential to Achieve Goals: Good    Frequency Min 2X/week     Co-evaluation               AM-PAC PT "6 Clicks" Mobility  Outcome Measure Help needed turning from your back to your side while in a flat bed without using bedrails?: A Lot Help needed moving from lying on your back to sitting on the side of a flat bed without using bedrails?: A Lot Help needed moving to and from a bed to a chair (including a wheelchair)?: A Lot Help needed standing up from a chair using your arms (e.g., wheelchair or bedside chair)?: Total Help needed to walk in hospital room?: Total Help needed climbing 3-5 steps with a railing? : Total 6 Click Score: 9     End of Session   Activity Tolerance: Patient limited by fatigue;Patient limited by lethargy Patient left: in bed;with call bell/phone within reach;with family/visitor present Nurse Communication: Mobility status PT Visit Diagnosis: Muscle weakness (generalized) (M62.81);Difficulty in walking, not elsewhere classified (R26.2)    Time: 1610-9604 PT Time Calculation (min) (ACUTE ONLY): 32 min   Charges:   PT Evaluation $PT Eval Moderate Complexity: 1 Mod PT Treatments $Therapeutic Activity: 8-22 mins       Ramond Dial 07/15/2022, 4:18 PM

## 2022-07-15 NOTE — ED Notes (Signed)
Breakfast order placed ?

## 2022-07-15 NOTE — ED Notes (Signed)
Spoke with Dr. Velia Meyer and will be d/c pt having to be on monitor and ordered prn med

## 2022-07-15 NOTE — ED Notes (Signed)
Pt found to have coffee spilled on gown. Pt stated "those people over there laughed when they spilled this on me". Gown and full linen changed.

## 2022-07-15 NOTE — Progress Notes (Signed)
  Inpatient Rehab Admissions Coordinator :  Per therapy recommendations patient was screened for CIR candidacy by Danne Baxter RN MSN. Patient is not yet at a level to tolerate the intensity required to pursue a CIR admit, is under observation status and without definitive diagnosis. Medical workup continues. He may lack the medical neccesity for a hospital based rehab program. The CIR admissions team will follow and monitor for progress and place a Rehab Consult order if felt to be appropriate. Other rehab venues may need to be pursued also. Please contact me with any questions.  Danne Baxter RN MSN Admissions Coordinator [phone]

## 2022-07-15 NOTE — H&P (Signed)
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 some recent dark-colored stool, but the timeframe of onset of such appears consistent with his initiation and subsequent discontinuation Pepto-Bismol, following which she is doing additional dark coloration in the stool.  We will check fecal occult blood test, to further correlate, and help guide need for additional colonoscopy, given most recent colonoscopy in March 2021 when patient's hemoglobin was in the range of 13-14.  There is also mild elevation in his total bilirubin.  We will add on smear, including for evaluation of schistocytes.  He does have history of daily alcohol consumption, which may fit with a macrocytic presentation.  Will add on INR to further evaluate hepatic synthetic function.  Plan: Add on iron studies, MMA, folic acid level, INR, peripheral smear, which has been ordered as technologist smear review via add-on order.  Type and screen.  Repeat CBC in the morning.  SCDs.  Right femoral oncologic DVT prophylaxis for now.  Add on reticulocyte  count.         #) Acute kidney injury: Labs are from this evening are most consistent with improving acute kidney injury, noting creatinine of 1.87 on 06/27/2022 relative to baseline creatinine ranges 0.8-1.0, with this evening's creatinine noted he improving relative to the final day of June 2023, but not yet back to  baseline.  Suspect that this resolving AKI was on the basis of preceding increase in GI losses given the patient's report of 3 weeks of diarrhea, now resolved.  CT abdomen/pelvis showed no evidence of postrenal obstructive source.  Additionally, urinalysis shows no evidence of urinary casts.  Patient eating and drinking well at this point.  Consequently, we will refrain from additional IV fluids for now, but rather closely monitor ensuing renal trend as well as volume status to EKG of supplemental IV fluids would be of benefit.  Plan: Monitor strict I's and O's Daily weights.  Tempt avoid nephrotoxic agents.  GIVEN that as well as lisinopril for now.  Repeat CMP in the morning.       #) hypomagnesemia: Presenting magnesium level noted at 1.4, status post grams IV magnesium administered in the ED setting.  Likely  a residual finding stemming from recent history of increased GI losses in the form of not resolved daily episodes of diarrhea.  Plan repeat serum magnesium level in the morning.        #) GERD: Documented history of such, omeprazole as an outpatient.  May need to consider discontinuation of outpatient PPI if persistence of hypomagnesemia.  Plan: For now, we will continue outpatient PPI, and repeat serum magnesium level tomorrow.           #) Hyperlipidemia: documented h/o such. On high intensity atorvastatin as outpatient.    Plan: continue home statin.          #) Essential Hypertension: documented h/o such, with outpatient antihypertensive regimen including Norvasc, lisinopril.  SBP's in the ED today: Normotensive mmHg. in setting of  presenting AKI, albeit in the process of resolving, will hold home lisinopril for now.  Plan: Close monitoring of subsequent BP via routine VS. hold lisinopril.  Continue home Norvasc.       DVT prophylaxis: SCD's   Code Status: Full code Disposition Plan: Per Rounding Team Consults called: none;  Admission status: Inpatient    PLEASE NOTE THAT DRAGON DICTATION SOFTWARE WAS USED IN THE CONSTRUCTION OF THIS NOTE.   Jenison DO [facility]  From [place]   07/15/2022, 12:40 AM

## 2022-07-15 NOTE — ED Notes (Signed)
MD Alfredia Ferguson made aware of confusion. MD to place orders for additional scans.

## 2022-07-15 NOTE — ED Notes (Signed)
Pt transported to mri 

## 2022-07-15 NOTE — ED Notes (Signed)
Admitting paged to RN per her request 
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
## 2022-07-16 DIAGNOSIS — N179 Acute kidney failure, unspecified: Secondary | ICD-10-CM | POA: Diagnosis not present

## 2022-07-16 DIAGNOSIS — I10 Essential (primary) hypertension: Secondary | ICD-10-CM | POA: Diagnosis not present

## 2022-07-16 DIAGNOSIS — K2100 Gastro-esophageal reflux disease with esophagitis, without bleeding: Secondary | ICD-10-CM | POA: Diagnosis not present

## 2022-07-16 DIAGNOSIS — R531 Weakness: Secondary | ICD-10-CM | POA: Diagnosis not present

## 2022-07-16 DIAGNOSIS — R296 Repeated falls: Secondary | ICD-10-CM | POA: Diagnosis not present

## 2022-07-16 LAB — COMPREHENSIVE METABOLIC PANEL
ALT: 15 U/L (ref 0–44)
AST: 26 U/L (ref 15–41)
Albumin: 2.6 g/dL — ABNORMAL LOW (ref 3.5–5.0)
Alkaline Phosphatase: 104 U/L (ref 38–126)
Anion gap: 11 (ref 5–15)
BUN: 13 mg/dL (ref 6–20)
CO2: 22 mmol/L (ref 22–32)
Calcium: 9.3 mg/dL (ref 8.9–10.3)
Chloride: 104 mmol/L (ref 98–111)
Creatinine, Ser: 1.13 mg/dL (ref 0.61–1.24)
GFR, Estimated: >60 mL/min (ref >60)
Glucose, Bld: 83 mg/dL (ref 70–99)
Potassium: 4 mmol/L (ref 3.5–5.1)
Sodium: 137 mmol/L (ref 135–145)
Total Bilirubin: 1.7 mg/dL — ABNORMAL HIGH (ref 0.3–1.2)
Total Protein: 7.3 g/dL (ref 6.5–8.1)

## 2022-07-16 LAB — CBC WITH DIFFERENTIAL/PLATELET
Abs Immature Granulocytes: 0.05 10*3/uL (ref 0.00–0.07)
Basophils Absolute: 0 10*3/uL (ref 0.0–0.1)
Basophils Relative: 1 %
Eosinophils Absolute: 0.1 10*3/uL (ref 0.0–0.5)
Eosinophils Relative: 2 %
HCT: 25.4 % — ABNORMAL LOW (ref 39.0–52.0)
Hemoglobin: 8.4 g/dL — ABNORMAL LOW (ref 13.0–17.0)
Immature Granulocytes: 1 %
Lymphocytes Relative: 27 %
Lymphs Abs: 1.5 10*3/uL (ref 0.7–4.0)
MCH: 32.9 pg (ref 26.0–34.0)
MCHC: 33.1 g/dL (ref 30.0–36.0)
MCV: 99.6 fL (ref 80.0–100.0)
Monocytes Absolute: 0.5 10*3/uL (ref 0.1–1.0)
Monocytes Relative: 9 %
Neutro Abs: 3.4 10*3/uL (ref 1.7–7.7)
Neutrophils Relative %: 60 %
Platelets: 179 10*3/uL (ref 150–400)
RBC: 2.55 MIL/uL — ABNORMAL LOW (ref 4.22–5.81)
RDW: 16.3 % — ABNORMAL HIGH (ref 11.5–15.5)
WBC: 5.5 10*3/uL (ref 4.0–10.5)
nRBC: 0 % (ref 0.0–0.2)

## 2022-07-16 LAB — HEMOGLOBIN AND HEMATOCRIT, BLOOD
HCT: 26.1 % — ABNORMAL LOW (ref 39.0–52.0)
Hemoglobin: 8.6 g/dL — ABNORMAL LOW (ref 13.0–17.0)

## 2022-07-16 LAB — GLUCOSE, CAPILLARY: Glucose-Capillary: 69 mg/dL — ABNORMAL LOW (ref 70–99)

## 2022-07-16 LAB — MAGNESIUM: Magnesium: 2.9 mg/dL — ABNORMAL HIGH (ref 1.7–2.4)

## 2022-07-16 LAB — PHOSPHORUS: Phosphorus: 4.3 mg/dL (ref 2.5–4.6)

## 2022-07-16 MED ORDER — ENOXAPARIN SODIUM 40 MG/0.4ML IJ SOSY
40.0000 mg | PREFILLED_SYRINGE | Freq: Every evening | INTRAMUSCULAR | Status: DC
  Administered 2022-07-16 – 2022-07-18 (×3): 40 mg via SUBCUTANEOUS
  Filled 2022-07-16 (×3): qty 0.4

## 2022-07-16 NOTE — TOC Initial Note (Addendum)
Transition of Care [facility]) - Initial/Assessment Note    Patient Details  Name: Brian Dixon MRN: [id number] Date of Birth: 1966-06-25  Transition of Care [facility]) CM/SW Contact:    Tresa Endo Phone Number: 07/16/2022, 12:49 PM  Clinical Narrative:                 CSW received SNF consult. CSW met with pt at bedside. CSW introduced self and explained role at the hospital. Pt reports that PTA he lived home alone. PT reports pt needs assistance with ADLs. Pt shared with CSW that he feels like his hands are getting weak and he knows his knees need some therapy.   CSW reviewed PT/OT recommendations for SNF. Pt gave CSW permission to fax out to facilities in the area. Pt has no preference of facility at this time.   CSW will continue to follow.    Expected Discharge Plan: Skilled Nursing Facility Barriers to Discharge: Continued Medical Work up   Patient Goals and CMS Choice Patient states their goals for this hospitalization and ongoing recovery are:: Rehab CMS Medicare.gov Compare Post Acute Care list provided to:: Patient Choice offered to / list presented to : Patient  Expected Discharge Plan and Services Expected Discharge Plan: [place] In-house Referral: Clinical Social Work   Post Acute Care Choice: [place] arrangements for the past 2 months: [place]                                      Prior Living Arrangements/Services Living arrangements for the past 2 months: Single Family Home Lives with:: Self   Do you feel safe going back to the place where you live?: Yes      Need for Family Participation in Patient Care: Yes (Comment) Care giver support system in place?: Yes (comment)   Criminal Activity/Legal Involvement Pertinent to Current Situation/Hospitalization: No - Comment as needed  Activities of Daily Living      Permission Sought/Granted Permission sought to share information with :  Facility Art therapist granted to share information with : Yes, Verbal Permission Granted  Share Information with NAME: KEANON, BEVINS (Mother)   [phone]  Permission granted to share info w AGENCY: SNF  Permission granted to share info w Relationship: JOCOB, DAMBACH (Mother)   [phone]  Permission granted to share info w Contact Information: ALMALIK, WEISSBERG (Mother)   [phone]  Emotional Assessment Appearance:: Appears stated age Attitude/Demeanor/Rapport: Engaged Affect (typically observed): Accepting Orientation: : Oriented to Self, Oriented to Place, Oriented to  Time, Oriented to Situation Alcohol / Substance Use: Not Applicable Psych Involvement: No (comment)  Admission diagnosis:  Hypomagnesemia [E83.42] Generalized weakness [R53.1] Frequent falls [R29.6] Patient Active Problem List   Diagnosis Date Noted   Frequent falls 07/15/2022   Acute anemia 07/15/2022   AKI (acute kidney injury) ([place]) 07/15/2022   HLD (hyperlipidemia) 07/15/2022   GERD (gastroesophageal reflux disease) 07/15/2022   Generalized weakness 07/14/2022   Neuropathy 03/20/2020   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 03/20/2020   Weakness generalized 03/20/2020   Colitis 11/09/2019   Family history of colon cancer 11/09/2019   Hypertensive urgency 06/28/2019   LVH (left ventricular hypertrophy) due to hypertensive disease, without heart failure 09/15/2018   Tobacco dependence 09/13/2018   Elevated TSH 09/13/2018   Hypertension 09/13/2018   Cerebral thrombosis with cerebral infarction 08/07/2018   Syncope 08/05/2018   Hypokalemia 08/05/2018  Hypomagnesemia 08/05/2018   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[place]) 08/05/2018   PCP:  Joya Gaskins, FNP Pharmacy:   [facility] DRUG STORE [place], [place] - [address] [place] [address] Phone: [phone] Fax: [phone]  [place] at [facility] [address] [place] [address] Phone: [phone] Fax: [phone]     Social Determinants of Health (SDOH) Interventions    Readmission Risk Interventions     No data to display

## 2022-07-16 NOTE — NC FL2 (Signed)
[place] LEVEL OF CARE SCREENING TOOL     IDENTIFICATION  Patient Name: Brian Dixon Birthdate: 09-11-1966 Sex: male Admission Date (Current Location): 07/14/2022  [facility] and [place] Number:  Herbalist and Address:  [facility] [place] [place], [place] [address] [place], [place] [address]      Provider Number: [id number]  Attending Physician Name and Address:  Alma Friendly, MD  Relative Name and Phone Number:  Norwood Quezada, [phone]    Current Level of Care: Hospital Recommended Level of Care: [place] Prior Approval Number:    Date Approved/Denied:   PASRR Number: [id number]  Discharge Plan: SNF    Current Diagnoses: Patient Active Problem List   Diagnosis Date Noted   Frequent falls 07/15/2022   Acute anemia 07/15/2022   AKI (acute kidney injury) ([place]) 07/15/2022   HLD (hyperlipidemia) 07/15/2022   GERD (gastroesophageal reflux disease) 07/15/2022   Generalized weakness 07/14/2022   Neuropathy 03/20/2020   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 03/20/2020   Weakness generalized 03/20/2020   Colitis 11/09/2019   Family history of colon cancer 11/09/2019   Hypertensive urgency 06/28/2019   LVH (left ventricular hypertrophy) due to hypertensive disease, without heart failure 09/15/2018   Tobacco dependence 09/13/2018   Elevated TSH 09/13/2018   Hypertension 09/13/2018   Cerebral thrombosis with cerebral infarction 08/07/2018   Syncope 08/05/2018   Hypokalemia 08/05/2018   Hypomagnesemia 08/05/2018   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[place]) 08/05/2018    Orientation RESPIRATION BLADDER Height & Weight     Self, Time, Situation, Place  Normal Continent Weight: 126 lb 5.2 oz (57.3 kg) Height:  '5\' 7"'$  (170.2 cm)  BEHAVIORAL SYMPTOMS/MOOD NEUROLOGICAL BOWEL NUTRITION STATUS      Continent Diet (See DC summary)  AMBULATORY STATUS COMMUNICATION OF NEEDS Skin   Extensive Assist Verbally  Normal                       Personal Care Assistance Level of Assistance  Bathing, Feeding, Dressing Bathing Assistance: Maximum assistance Feeding assistance: Limited assistance Dressing Assistance: Maximum assistance     Functional Limitations Info  Sight, Speech, Hearing Sight Info: Adequate Hearing Info: Adequate Speech Info: Adequate    SPECIAL CARE FACTORS FREQUENCY  PT (By licensed PT), OT (By licensed OT)     PT Frequency: 5x week OT Frequency: 5x week            Contractures Contractures Info: Not present    Additional Factors Info  Code Status, Allergies Code Status Info: Full Allergies Info: Shellfish           Current Medications (07/16/2022):  This is the current hospital active medication list Current Facility-Administered Medications  Medication Dose Route Frequency Provider Last Rate Last Admin   0.9 %  sodium chloride infusion   Intravenous Continuous Raiford Noble [place], DO 100 mL/hr at 07/16/22 0854 New Bag at 07/16/22 0854   acetaminophen (TYLENOL) tablet 650 mg  650 mg Oral Q6H PRN Howerter, Justin B, DO       Or   acetaminophen (TYLENOL) suppository 650 mg  650 mg Rectal Q6H PRN Howerter, Justin B, DO       amLODipine (NORVASC) tablet 5 mg  5 mg Oral Daily Howerter, Justin B, DO   5 mg at 07/16/22 0846   atorvastatin (LIPITOR) tablet 40 mg  40 mg Oral QPM Howerter, Justin B, DO   40 mg at 07/15/22 2106  folic acid (FOLVITE) tablet 1 mg  1 mg Oral Daily Raiford Noble [place], DO   1 mg at 07/16/22 0846   LORazepam (ATIVAN) tablet 1-4 mg  1-4 mg Oral Q1H PRN Raiford Noble Latif, DO       Or   LORazepam (ATIVAN) injection 1-4 mg  1-4 mg Intravenous Q1H PRN Raiford Noble Latif, DO       multivitamin with minerals tablet 1 tablet  1 tablet Oral Daily Raiford Noble [place], [place]   1 tablet at 07/16/22 0846   pantoprazole (PROTONIX) EC tablet 40 mg  40 mg Oral Daily Howerter, Justin B, DO   40 mg at 07/16/22 0846   thiamine tablet 100 mg  100 mg  Oral Daily Raiford Noble [place], DO   100 mg at 07/16/22 3291   Or   thiamine (B-1) injection 100 mg  100 mg Intravenous Daily Kerney Elbe, DO         Discharge Medications: Please see discharge summary for a list of discharge medications.  Relevant Imaging Results:  Relevant Lab Results:   Additional Information SS# [place], LCSWA

## 2022-07-16 NOTE — TOC Initial Note (Signed)
Transition of Care [facility]) - Initial/Assessment Note    Patient Details  Name: Brian Dixon MRN: [id number] Date of Birth: 07-31-1966  Transition of Care [facility]) CM/SW Contact:    Carles Collet, RN Phone Number: 07/16/2022, 12:49 PM  Clinical Narrative:               Damaris Schooner w patient and parents at bedside. They state patient lives at home alone. All are interested in CIR, explained that he has been screened and he is too weak for CIR at this time, and we can look into SNF for therapy prior to him returning home. All are interested and CM will notify CSW.  Patient denies excessive etoh use and all drug use and has a negative tox screen.       Expected Discharge Plan: [place] (Simultaneous filing. User may not have seen previous data.) Barriers to Discharge: Continued Medical Work up (Electrical engineer. User may not have seen previous data.)   Patient Goals and CMS Choice Patient states their goals for this hospitalization and ongoing recovery are:: to go to rehab (Simultaneous filing. User may not have seen previous data.) CMS Medicare.gov Compare Post Acute Care list provided to:: Patient (Simultaneous filing. User may not have seen previous data.) Choice offered to / list presented to : Patient, Parent (Simultaneous filing. User may not have seen previous data.)  Expected Discharge Plan and Services Expected Discharge Plan: [place] (Simultaneous filing. User may not have seen previous data.) In-house Referral: Clinical Social Work English as a second language teacher. User may not have seen previous data.) Discharge Planning Services: CM Consult Post Acute Care Choice: [place] (Simultaneous filing. User may not have seen previous data.) Living arrangements for the past 2 months: Single Family Home (Simultaneous filing. User may not have seen previous data.)                                      Prior Living  Arrangements/Services Living arrangements for the past 2 months: Single Family Home (Simultaneous filing. User may not have seen previous data.) Lives with:: Self (Simultaneous filing. User may not have seen previous data.)   Do you feel safe going back to the place where you live?: Yes      Need for Family Participation in Patient Care: Yes (Comment) Care giver support system in place?: Yes (comment)   Criminal Activity/Legal Involvement Pertinent to Current Situation/Hospitalization: No - Comment as needed  Activities of Daily Living      Permission Sought/Granted Permission sought to share information with : Facility Art therapist granted to share information with : Yes, Verbal Permission Granted  Share Information with NAME: CLARANCE, BOLLARD (Mother)   [phone]  Permission granted to share info w AGENCY: SNF  Permission granted to share info w Relationship: BRYDON, SPAHR (Mother)   [phone]  Permission granted to share info w Contact Information: EMBER, GOTTWALD (Mother)   [phone]  Emotional Assessment Appearance:: Appears stated age Attitude/Demeanor/Rapport: Engaged Affect (typically observed): Accepting Orientation: : Oriented to Self, Oriented to Place, Oriented to  Time, Oriented to Situation Alcohol / Substance Use: Not Applicable Psych Involvement: No (comment)  Admission diagnosis:  Hypomagnesemia [E83.42] Generalized weakness [R53.1] Frequent falls [R29.6] Patient Active Problem List   Diagnosis Date Noted   Frequent falls 07/15/2022   Acute anemia 07/15/2022   AKI (acute kidney injury) ([place]) 07/15/2022   HLD (hyperlipidemia) 07/15/2022   GERD (gastroesophageal reflux  disease) 07/15/2022   Generalized weakness 07/14/2022   Neuropathy 03/20/2020   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 03/20/2020   Weakness generalized 03/20/2020   Colitis 11/09/2019   Family history of colon cancer 11/09/2019   Hypertensive urgency  06/28/2019   LVH (left ventricular hypertrophy) due to hypertensive disease, without heart failure 09/15/2018   Tobacco dependence 09/13/2018   Elevated TSH 09/13/2018   Hypertension 09/13/2018   Cerebral thrombosis with cerebral infarction 08/07/2018   Syncope 08/05/2018   Hypokalemia 08/05/2018   Hypomagnesemia 08/05/2018   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[place]) 08/05/2018   PCP:  Joya Gaskins, FNP Pharmacy:   [facility] DRUG STORE [place], [place] - [address] [place] [address] Phone: [phone] Fax: [phone]  [place] at [facility] [address] [place] [address] Phone: [phone] Fax: [phone]     Social Determinants of Health (SDOH) Interventions    Readmission Risk Interventions     No data to display
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
## 2022-07-17 DIAGNOSIS — I10 Essential (primary) hypertension: Secondary | ICD-10-CM | POA: Diagnosis not present

## 2022-07-17 DIAGNOSIS — R296 Repeated falls: Secondary | ICD-10-CM | POA: Diagnosis not present

## 2022-07-17 DIAGNOSIS — K2100 Gastro-esophageal reflux disease with esophagitis, without bleeding: Secondary | ICD-10-CM | POA: Diagnosis not present

## 2022-07-17 DIAGNOSIS — R531 Weakness: Secondary | ICD-10-CM | POA: Diagnosis not present

## 2022-07-17 DIAGNOSIS — N179 Acute kidney failure, unspecified: Secondary | ICD-10-CM | POA: Diagnosis not present

## 2022-07-17 LAB — BASIC METABOLIC PANEL
Anion gap: 10 (ref 5–15)
BUN: 8 mg/dL (ref 6–20)
CO2: 19 mmol/L — ABNORMAL LOW (ref 22–32)
Calcium: 8.9 mg/dL (ref 8.9–10.3)
Chloride: 107 mmol/L (ref 98–111)
Creatinine, Ser: 1.05 mg/dL (ref 0.61–1.24)
GFR, Estimated: >60 mL/min (ref >60)
Glucose, Bld: 77 mg/dL (ref 70–99)
Potassium: 4.3 mmol/L (ref 3.5–5.1)
Sodium: 136 mmol/L (ref 135–145)

## 2022-07-17 LAB — CBC WITH DIFFERENTIAL/PLATELET
Abs Immature Granulocytes: 0.06 10*3/uL (ref 0.00–0.07)
Basophils Absolute: 0 10*3/uL (ref 0.0–0.1)
Basophils Relative: 1 %
Eosinophils Absolute: 0.1 10*3/uL (ref 0.0–0.5)
Eosinophils Relative: 1 %
HCT: 23.6 % — ABNORMAL LOW (ref 39.0–52.0)
Hemoglobin: 7.8 g/dL — ABNORMAL LOW (ref 13.0–17.0)
Immature Granulocytes: 1 %
Lymphocytes Relative: 28 %
Lymphs Abs: 1.5 10*3/uL (ref 0.7–4.0)
MCH: 32.5 pg (ref 26.0–34.0)
MCHC: 33.1 g/dL (ref 30.0–36.0)
MCV: 98.3 fL (ref 80.0–100.0)
Monocytes Absolute: 0.4 10*3/uL (ref 0.1–1.0)
Monocytes Relative: 8 %
Neutro Abs: 3.3 10*3/uL (ref 1.7–7.7)
Neutrophils Relative %: 61 %
Platelets: 152 10*3/uL (ref 150–400)
RBC: 2.4 MIL/uL — ABNORMAL LOW (ref 4.22–5.81)
RDW: 15.9 % — ABNORMAL HIGH (ref 11.5–15.5)
WBC: 5.3 10*3/uL (ref 4.0–10.5)
nRBC: 0 % (ref 0.0–0.2)

## 2022-07-17 LAB — GLUCOSE, CAPILLARY
Glucose-Capillary: 74 mg/dL (ref 70–99)
Glucose-Capillary: 79 mg/dL (ref 70–99)

## 2022-07-17 MED ORDER — AMLODIPINE BESYLATE 10 MG PO TABS
10.0000 mg | ORAL_TABLET | Freq: Every day | ORAL | Status: DC
  Administered 2022-07-17 – 2022-07-19 (×3): 10 mg via ORAL
  Filled 2022-07-17 (×3): qty 1
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
## 2022-07-17 NOTE — TOC Progression Note (Signed)
Transition of Care [facility]) - Progression Note    Patient Details  Name: Brian Dixon MRN: [id number] Date of Birth: 1966-08-30  Transition of [facility]) CM/SW Contact  Verdell Carmine, RN Phone Number: 07/17/2022, 4:18 PM  Clinical Narrative:    Choices for SNF given to patient, patient chose [facility]. CSW aware   Expected Discharge Plan: [place] (Simultaneous filing. User may not have seen previous data.) Barriers to Discharge: Continued Medical Work up (Electrical engineer. User may not have seen previous data.)  Expected Discharge Plan and Services Expected Discharge Plan: [place] (Simultaneous filing. User may not have seen previous data.) In-house Referral: Clinical Social Work English as a second language teacher. User may not have seen previous data.) Discharge Planning Services: CM Consult Post Acute Care Choice: [place] (Simultaneous filing. User may not have seen previous data.) Living arrangements for the past 2 months: Single Family Home (Simultaneous filing. User may not have seen previous data.)                                       Social Determinants of Health (SDOH) Interventions    Readmission Risk Interventions     No data to display

## 2022-07-17 NOTE — Progress Notes (Signed)
Physical Therapy Treatment Patient Details Name: Brian Dixon MRN: [id number] DOB: 01-07-1966 Today's Date: 07/17/2022   History of Present Illness 56 yo male with onset of falls and inability to walk was admitted on 7/17 with a 1.5 week history of these issues.  Pt is reporting mult LE joints are painful, has anemia, mult falls a day and has managed at home despite the lack of help previously.  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 Pt participates in transfer training, but requires significant physical assist due to B knee buckling. PT provides education for exercise when pt is in bed or chair for LE strengthening. Continued therapy will assist the pt in improving strength and balance for progression of functional mobility and return to prior level of independence. PT updates recommendation to SNF (pt not a CIR candidate), as pt is at an increased risk of falling and requires frequent supervision and significant physical assistance that cannot be provided at home.   Recommendations for follow up therapy are one component of a multi-disciplinary discharge planning process, led by the attending physician.  Recommendations may be updated based on patient status, additional functional criteria and insurance authorization.  Follow Up Recommendations  Skilled nursing-short term rehab (<3 hours/day) Can patient physically be transported by private vehicle: No   Assistance Recommended at Discharge Frequent or constant Supervision/Assistance  Patient can return home with the following A lot of help with walking and/or transfers;A lot of help with bathing/dressing/bathroom;Assistance with cooking/housework;Assist for transportation;Help with stairs or ramp for entrance   Equipment  Recommendations  None recommended by PT;Other (comment)    Recommendations for Other Services       Precautions / Restrictions Precautions Precautions: Fall Restrictions Weight Bearing Restrictions: No     Mobility  Bed Mobility Overal bed mobility: Needs Assistance Bed Mobility: Supine to Sit     Supine to sit: Supervision     General bed mobility comments: Significant use of bed rail with UE    Transfers Overall transfer level: Needs assistance Equipment used: Rolling walker (2 wheels), None Transfers: Sit to/from Stand, Bed to chair/wheelchair/BSC Sit to Stand: Max assist, Total assist (Initial trial with RW - Total assist and unsuccesful, as pt has B buckling of LE; second trial no AD) Stand pivot transfers: Max assist (No AD)         General transfer comment: PT gives cues for hand placement and forward lean; pt expresses fear of falling near EOB and in standing    Ambulation/Gait                   Stairs             Wheelchair Mobility    Modified Rankin (Stroke Patients Only)       Balance Overall balance assessment: History of Falls, Needs assistance Sitting-balance support: Feet supported, No upper extremity supported Sitting balance-Leahy Scale: Fair     Standing balance support: Bilateral upper extremity supported, During functional activity, Reliant on assistive device for balance Standing balance-Leahy Scale: Zero                              Cognition Arousal/Alertness: Awake/alert Behavior During Therapy: WFL for tasks assessed/performed Overall Cognitive Status: Difficult to assess  Exercises Other Exercises Other Exercises: PT Educates for: Long arc quad, seated marching, glute sets, foot taps    General Comments General comments (skin integrity, edema, etc.): VSS on RA      Pertinent Vitals/Pain Pain Assessment Pain Assessment: No/denies  pain    Home Living                          Prior Function            PT Goals (current goals can now be found in the care plan section) Acute Rehab PT Goals Patient Stated Goal: none stated PT Goal Formulation: With family Time For Goal Achievement: 07/29/22 Potential to Achieve Goals: Good Progress towards PT goals: Progressing toward goals    Frequency    Min 2X/week      PT Plan Current plan remains appropriate    Co-evaluation              AM-PAC PT "6 Clicks" Mobility   Outcome Measure  Help needed turning from your back to your side while in a flat bed without using bedrails?: A Little Help needed moving from lying on your back to sitting on the side of a flat bed without using bedrails?: A Little Help needed moving to and from a bed to a chair (including a wheelchair)?: A Lot Help needed standing up from a chair using your arms (e.g., wheelchair or bedside chair)?: A Lot Help needed to walk in hospital room?: Total Help needed climbing 3-5 steps with a railing? : Total 6 Click Score: 12    End of Session Equipment Utilized During Treatment: Gait belt Activity Tolerance: Patient limited by fatigue;Other (comment) (Pt limited by weakness) Patient left: in chair;with call bell/phone within reach;with chair alarm set Nurse Communication: Mobility status PT Visit Diagnosis: Unsteadiness on feet (R26.81);Other abnormalities of gait and mobility (R26.89);Muscle weakness (generalized) (M62.81);History of falling (Z91.81);Difficulty in walking, not elsewhere classified (R26.2)     Time: 1914-7829 PT Time Calculation (min) (ACUTE ONLY): 23 min  Charges:  $Therapeutic Exercise: 8-22 mins $Therapeutic Activity: 8-22 mins                     Hall Busing, SPT Acute Rehabilitation Office #: [phone]    Hall Busing 07/17/2022, 5:38 PM

## 2022-07-17 NOTE — Progress Notes (Signed)
Inpatient Rehabilitation Admissions Coordinator   As per my note on 7/18, he is not a CIR candidate. Other rehab venues need to be pursued. TOC is aware.  Danne Baxter, RN, MSN Rehab Admissions Coordinator [phone] 07/17/2022 1:09 PM

## 2022-07-17 NOTE — Progress Notes (Signed)
Speech Language Pathology Treatment: Dysphagia  Patient Details Name: Brian Dixon MRN: [id number] DOB: Jun 24, 1966 Today's Date: 07/17/2022 Time: 1886-7737 SLP Time Calculation (min) (ACUTE ONLY): 20 min  Assessment / Plan / Recommendation Clinical Impression  Patient seen by SLP for skilled treatment  session focused on dysphagia goals. RN reported that patient's Mom has been helping with meals and she cuts up the food a little more than the Dys 3 solid textures. Patient not reporting any significant change to his swallow function at this time. SLP observed him with PO's of crackers with peanut butter and thin liquids. He was able to feed self, expected prolonged mastication as he is edentulous and has h/o partial glossectomy, but otherwise no overt s/s of oral or pharyngeal dysphagia. SLP encouraged patient to inform RN if he is having any swallowing difficulties while here in hospital. SLP to s/o at this time and do not feel it is necessary to have an objective swallow study as he seems to be at baseline s/p oropharyngeal cancer and radiation and surgical treatments. SLP recommending that if patient has new onset dysphagia he consult his PCP for OP SLP evaluation of swallow.     HPI HPI: Patient is a 56 y.o. male with PMH: cancer of the oropharynx s/p radiation, s/p partial glossectomy, pharyngectomy in October of 2019, swallowing changes, elevated liver enzymes, GERD, colitis, insomnia, stroke, diverticulosis, thrombocytopenia, back surgery, stroke. He was admitted to the hospital on 07/14/22 with new onset of falls and inability to walk. MBS was performed on 08/08/2018 prior to treatment of his oropharyngeal cancer and at that time his oropharyngeal swallow was [facility]. He briefly was followed by OP SLP.      SLP Plan  All goals met;Discharge SLP treatment due to (comment)      Recommendations for follow up therapy are one component of a multi-disciplinary discharge planning process, led by the  attending physician.  Recommendations may be updated based on patient status, additional functional criteria and insurance authorization.    Recommendations  Diet recommendations: Dysphagia 3 (mechanical soft);Thin liquid Liquids provided via: Cup;Straw Medication Administration: Whole meds with liquid Supervision: Patient able to self feed Compensations: Small sips/bites;Slow rate Postural Changes and/or Swallow Maneuvers: Seated upright 90 degrees                Oral Care Recommendations: Oral care BID Follow Up Recommendations: No SLP follow up Assistance recommended at discharge: Intermittent Supervision/Assistance SLP Visit Diagnosis: Dysphagia, unspecified (R13.10) Plan: All goals met;Discharge SLP treatment due to (comment)          Sonia Baller, MA, CCC-SLP Speech Therapy

## 2022-07-18 DIAGNOSIS — E785 Hyperlipidemia, unspecified: Secondary | ICD-10-CM | POA: Diagnosis not present

## 2022-07-18 DIAGNOSIS — K2100 Gastro-esophageal reflux disease with esophagitis, without bleeding: Secondary | ICD-10-CM | POA: Diagnosis not present

## 2022-07-18 DIAGNOSIS — R296 Repeated falls: Secondary | ICD-10-CM | POA: Diagnosis not present

## 2022-07-18 DIAGNOSIS — R531 Weakness: Secondary | ICD-10-CM | POA: Diagnosis not present

## 2022-07-18 DIAGNOSIS — N179 Acute kidney failure, unspecified: Secondary | ICD-10-CM | POA: Diagnosis not present

## 2022-07-18 DIAGNOSIS — D649 Anemia, unspecified: Secondary | ICD-10-CM | POA: Diagnosis not present

## 2022-07-18 DIAGNOSIS — I10 Essential (primary) hypertension: Secondary | ICD-10-CM | POA: Diagnosis not present

## 2022-07-18 LAB — CBC WITH DIFFERENTIAL/PLATELET
Abs Immature Granulocytes: 0.03 10*3/uL (ref 0.00–0.07)
Basophils Absolute: 0 10*3/uL (ref 0.0–0.1)
Basophils Relative: 1 %
Eosinophils Absolute: 0.1 10*3/uL (ref 0.0–0.5)
Eosinophils Relative: 2 %
HCT: 23.1 % — ABNORMAL LOW (ref 39.0–52.0)
Hemoglobin: 7.9 g/dL — ABNORMAL LOW (ref 13.0–17.0)
Immature Granulocytes: 1 %
Lymphocytes Relative: 30 %
Lymphs Abs: 1.2 10*3/uL (ref 0.7–4.0)
MCH: 33.5 pg (ref 26.0–34.0)
MCHC: 34.2 g/dL (ref 30.0–36.0)
MCV: 97.9 fL (ref 80.0–100.0)
Monocytes Absolute: 0.4 10*3/uL (ref 0.1–1.0)
Monocytes Relative: 10 %
Neutro Abs: 2.4 10*3/uL (ref 1.7–7.7)
Neutrophils Relative %: 56 %
Platelets: 159 10*3/uL (ref 150–400)
RBC: 2.36 MIL/uL — ABNORMAL LOW (ref 4.22–5.81)
RDW: 15.9 % — ABNORMAL HIGH (ref 11.5–15.5)
WBC: 4.1 10*3/uL (ref 4.0–10.5)
nRBC: 0 % (ref 0.0–0.2)

## 2022-07-18 LAB — BASIC METABOLIC PANEL
Anion gap: 8 (ref 5–15)
BUN: 8 mg/dL (ref 6–20)
CO2: 19 mmol/L — ABNORMAL LOW (ref 22–32)
Calcium: 9.2 mg/dL (ref 8.9–10.3)
Chloride: 106 mmol/L (ref 98–111)
Creatinine, Ser: 1.09 mg/dL (ref 0.61–1.24)
GFR, Estimated: >60 mL/min (ref >60)
Glucose, Bld: 82 mg/dL (ref 70–99)
Potassium: 4.1 mmol/L (ref 3.5–5.1)
Sodium: 133 mmol/L — ABNORMAL LOW (ref 135–145)

## 2022-07-18 LAB — RPR: RPR Ser Ql: NONREACTIVE

## 2022-07-18 LAB — VITAMIN B12: Vitamin B-12: 874 pg/mL (ref 180–914)

## 2022-07-18 LAB — METHYLMALONIC ACID, SERUM: Methylmalonic Acid, Quantitative: 121 nmol/L (ref 0–378)

## 2022-07-18 MED ORDER — ONDANSETRON HCL 4 MG/2ML IJ SOLN: 4.0000 mg | Freq: Four times a day (QID) | INTRAMUSCULAR | Status: DC | PRN
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
## 2022-07-18 NOTE — Progress Notes (Signed)
Mobility Specialist Progress Note:   07/18/22 1420  Mobility  Activity Turned to right side;Turned to left side;Turned to back - supine  Level of Assistance Minimal assist, patient does 75% or more  Assistive Device None  Activity Response Tolerated well  $Mobility charge 1 Mobility   Pt requesting to bed put on bed pan for BM. Light minA required to turn. Pt left on bedpan, educated to call for NSG when done.   [place] Numbers Acute Rehab Secure Chat or Office Phone: [phone]

## 2022-07-18 NOTE — TOC Progression Note (Signed)
Transition of Care [facility]) - Progression Note    Patient Details  Name: Brian Dixon MRN: [id number] Date of Birth: Aug 03, 1966  [facility] CM/SW Contact  Reece Agar, [place] Phone Number: 07/18/2022, 1:24 PM  Clinical Narrative:    Pt auth approved auth ref# [id number]; pt will DC To [facility] when medically stable.    Expected Discharge Plan: [place] (Simultaneous filing. 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 User may not have seen previous data.) In-house Referral: Clinical Social Work English as a second language teacher. User may not have seen previous data.) Discharge Planning Services: CM Consult Post Acute Care Choice: [place] (Simultaneous filing. User may not have seen previous data.) Living arrangements for the past 2 months: Single Family Home (Simultaneous filing. User may not have seen previous data.)                                       Social Determinants of Health (SDOH) Interventions    Readmission Risk Interventions     No data to display

## 2022-07-19 DIAGNOSIS — Z79899 Other long term (current) drug therapy: Secondary | ICD-10-CM | POA: Diagnosis not present

## 2022-07-19 DIAGNOSIS — R109 Unspecified abdominal pain: Secondary | ICD-10-CM | POA: Diagnosis not present

## 2022-07-19 DIAGNOSIS — M25569 Pain in unspecified knee: Secondary | ICD-10-CM | POA: Diagnosis not present

## 2022-07-19 DIAGNOSIS — D649 Anemia, unspecified: Secondary | ICD-10-CM | POA: Diagnosis not present

## 2022-07-19 DIAGNOSIS — Z85810 Personal history of malignant neoplasm of tongue: Secondary | ICD-10-CM | POA: Diagnosis not present

## 2022-07-19 DIAGNOSIS — R1084 Generalized abdominal pain: Secondary | ICD-10-CM | POA: Diagnosis not present

## 2022-07-19 DIAGNOSIS — F1010 Alcohol abuse, uncomplicated: Secondary | ICD-10-CM | POA: Diagnosis not present

## 2022-07-19 DIAGNOSIS — R1011 Right upper quadrant pain: Secondary | ICD-10-CM | POA: Diagnosis not present

## 2022-07-19 DIAGNOSIS — Z7401 Bed confinement status: Secondary | ICD-10-CM | POA: Diagnosis not present

## 2022-07-19 DIAGNOSIS — R4189 Other symptoms and signs involving cognitive functions and awareness: Secondary | ICD-10-CM | POA: Diagnosis not present

## 2022-07-19 DIAGNOSIS — D511 Vitamin B12 deficiency anemia due to selective vitamin B12 malabsorption with proteinuria: Secondary | ICD-10-CM | POA: Diagnosis not present

## 2022-07-19 DIAGNOSIS — G621 Alcoholic polyneuropathy: Secondary | ICD-10-CM | POA: Diagnosis not present

## 2022-07-19 DIAGNOSIS — Z8673 Personal history of transient ischemic attack (TIA), and cerebral infarction without residual deficits: Secondary | ICD-10-CM | POA: Diagnosis not present

## 2022-07-19 DIAGNOSIS — M6281 Muscle weakness (generalized): Secondary | ICD-10-CM | POA: Diagnosis not present

## 2022-07-19 DIAGNOSIS — K5900 Constipation, unspecified: Secondary | ICD-10-CM | POA: Diagnosis not present

## 2022-07-19 DIAGNOSIS — R531 Weakness: Secondary | ICD-10-CM | POA: Diagnosis not present

## 2022-07-19 DIAGNOSIS — F10188 Alcohol abuse with other alcohol-induced disorder: Secondary | ICD-10-CM | POA: Diagnosis not present

## 2022-07-19 DIAGNOSIS — E46 Unspecified protein-calorie malnutrition: Secondary | ICD-10-CM | POA: Diagnosis not present

## 2022-07-19 DIAGNOSIS — M87051 Idiopathic aseptic necrosis of right femur: Secondary | ICD-10-CM | POA: Diagnosis not present

## 2022-07-19 DIAGNOSIS — K2100 Gastro-esophageal reflux disease with esophagitis, without bleeding: Secondary | ICD-10-CM | POA: Diagnosis not present

## 2022-07-19 DIAGNOSIS — I119 Hypertensive heart disease without heart failure: Secondary | ICD-10-CM | POA: Diagnosis not present

## 2022-07-19 DIAGNOSIS — R112 Nausea with vomiting, unspecified: Secondary | ICD-10-CM | POA: Diagnosis not present

## 2022-07-19 DIAGNOSIS — R1312 Dysphagia, oropharyngeal phase: Secondary | ICD-10-CM | POA: Diagnosis not present

## 2022-07-19 DIAGNOSIS — I10 Essential (primary) hypertension: Secondary | ICD-10-CM | POA: Diagnosis not present

## 2022-07-19 DIAGNOSIS — K219 Gastro-esophageal reflux disease without esophagitis: Secondary | ICD-10-CM | POA: Diagnosis not present

## 2022-07-19 DIAGNOSIS — Z85818 Personal history of malignant neoplasm of other sites of lip, oral cavity, and pharynx: Secondary | ICD-10-CM | POA: Diagnosis not present

## 2022-07-19 DIAGNOSIS — R296 Repeated falls: Secondary | ICD-10-CM | POA: Diagnosis not present

## 2022-07-19 DIAGNOSIS — E785 Hyperlipidemia, unspecified: Secondary | ICD-10-CM | POA: Diagnosis not present

## 2022-07-19 DIAGNOSIS — R262 Difficulty in walking, not elsewhere classified: Secondary | ICD-10-CM | POA: Diagnosis not present

## 2022-07-19 DIAGNOSIS — R41841 Cognitive communication deficit: Secondary | ICD-10-CM | POA: Diagnosis not present

## 2022-07-19 DIAGNOSIS — M545 Low back pain, unspecified: Secondary | ICD-10-CM | POA: Diagnosis not present

## 2022-07-19 DIAGNOSIS — N179 Acute kidney failure, unspecified: Secondary | ICD-10-CM | POA: Diagnosis not present

## 2022-07-19 LAB — CBC WITH DIFFERENTIAL/PLATELET
Abs Immature Granulocytes: 0.05 10*3/uL (ref 0.00–0.07)
Basophils Absolute: 0 10*3/uL (ref 0.0–0.1)
Basophils Relative: 1 %
Eosinophils Absolute: 0.1 10*3/uL (ref 0.0–0.5)
Eosinophils Relative: 1 %
HCT: 22.7 % — ABNORMAL LOW (ref 39.0–52.0)
Hemoglobin: 7.6 g/dL — ABNORMAL LOW (ref 13.0–17.0)
Immature Granulocytes: 1 %
Lymphocytes Relative: 22 %
Lymphs Abs: 1.2 10*3/uL (ref 0.7–4.0)
MCH: 32.8 pg (ref 26.0–34.0)
MCHC: 33.5 g/dL (ref 30.0–36.0)
MCV: 97.8 fL (ref 80.0–100.0)
Monocytes Absolute: 0.4 10*3/uL (ref 0.1–1.0)
Monocytes Relative: 8 %
Neutro Abs: 3.7 10*3/uL (ref 1.7–7.7)
Neutrophils Relative %: 67 %
Platelets: 144 10*3/uL — ABNORMAL LOW (ref 150–400)
RBC: 2.32 MIL/uL — ABNORMAL LOW (ref 4.22–5.81)
RDW: 15.9 % — ABNORMAL HIGH (ref 11.5–15.5)
WBC: 5.6 10*3/uL (ref 4.0–10.5)
nRBC: 0 % (ref 0.0–0.2)

## 2022-07-19 LAB — VITAMIN B1: Vitamin B1 (Thiamine): 25.7 nmol/L — ABNORMAL LOW (ref 66.5–200.0)

## 2022-07-19 LAB — BASIC METABOLIC PANEL
Anion gap: 11 (ref 5–15)
BUN: 9 mg/dL (ref 6–20)
CO2: 19 mmol/L — ABNORMAL LOW (ref 22–32)
Calcium: 8.8 mg/dL — ABNORMAL LOW (ref 8.9–10.3)
Chloride: 103 mmol/L (ref 98–111)
Creatinine, Ser: 1.17 mg/dL (ref 0.61–1.24)
GFR, Estimated: >60 mL/min (ref >60)
Glucose, Bld: 90 mg/dL (ref 70–99)
Potassium: 3.8 mmol/L (ref 3.5–5.1)
Sodium: 133 mmol/L — ABNORMAL LOW (ref 135–145)

## 2022-07-19 MED ORDER — POLYETHYLENE GLYCOL 3350 17 G PO PACK: 17.0000 g | PACK | Freq: Every day | ORAL | 0 refills | Status: DC

## 2022-07-19 MED ORDER — GABAPENTIN 100 MG PO CAPS: 100.0000 mg | ORAL_CAPSULE | Freq: Two times a day (BID) | ORAL | Status: DC

## 2022-07-19 MED ORDER — FOLIC ACID 1 MG PO TABS: 1.0000 mg | ORAL_TABLET | Freq: Every day | ORAL | 0 refills | Status: AC

## 2022-07-19 MED ORDER — ADULT MULTIVITAMIN W/MINERALS CH: 1.0000 | ORAL_TABLET | Freq: Every day | ORAL | Status: DC

## 2022-07-19 NOTE — Progress Notes (Signed)
Nsg Discharge Note  Admit Date:  07/14/2022 Discharge date: 07/19/2022   Brian Dixon to be D/C'd Skilled nursing facility per MD order.  AVS completed.   Removed IV-CDI. Called report to [facility] at [facility]. PTAR transported patient. Mother and father in room when he left. Patient/caregiver able to verbalize understanding.  Discharge Medication: Allergies as of 07/19/2022       Reactions   Shellfish Allergy Other (See Comments)   Patient says shellfish causes gout attack        Medication List     STOP taking these medications    Potassium 99 MG Tabs       TAKE these medications    amLODipine 5 MG tablet Commonly known as: NORVASC Take 1 tablet (5 mg total) by mouth daily for 365 doses.   antiseptic oral rinse Liqd 15 mLs by Mouth Rinse route as needed for dry mouth.   atorvastatin 40 MG tablet Commonly known as: LIPITOR Take 1 tablet (40 mg total) by mouth every evening.   Biotene Dry Mouth Gentle Pste Place 1 application onto teeth in the morning and at bedtime.   cholecalciferol 25 MCG (1000 UNIT) tablet Commonly known as: VITAMIN D Take 1,000 Units by mouth daily.   dextromethorphan-guaiFENesin 30-600 MG 12hr tablet Commonly known as: MUCINEX DM Take 1 tablet by mouth 2 (two) times daily.   Dry Eye Relief Drops 0.2-0.2-1 % Soln Generic drug: Glycerin-Hypromellose-PEG 400 Place 1 drop into both eyes 3 (three) times daily as needed (dry/irritated eyes.).   Ensure Take 237 mLs by mouth 2 (two) times daily between meals.   folic acid 1 MG tablet Commonly known as: FOLVITE Take 1 tablet (1 mg total) by mouth daily. Start taking on: July 20, 2022   gabapentin 100 MG capsule Commonly known as: NEURONTIN Take 1 capsule (100 mg total) by mouth 2 (two) times daily. What changed:  medication strength how much to take   [facility] Advanced Relief 16-11 % Crea Generic drug: Menthol-Camphor Apply 1 application topically 3 (three) times daily as  needed (pain).   lisinopril 10 MG tablet Commonly known as: ZESTRIL Take 1 tablet (10 mg total) by mouth daily.   multivitamin with minerals Tabs tablet Take 1 tablet by mouth daily. Start taking on: July 20, 2022   omeprazole 20 MG capsule Commonly known as: PRILOSEC Take 1 capsule (20 mg total) by mouth daily.   polyethylene glycol 17 g packet Commonly known as: MiraLax Take 17 g by mouth daily.   thiamine 100 MG tablet Commonly known as: Vitamin B-1 Take 1 tablet (100 mg total) by mouth daily.        Discharge Assessment: Vitals:   07/19/22 0532 07/19/22 0822  BP: (!) 143/95 (!) 141/86  Pulse: 98 (!) 105  Resp: 17 16  Temp: 97.9 F (36.6 C) 98.3 F (36.8 C)  SpO2:  100%   Skin clean, dry and intact without evidence of skin break down, no evidence of skin tears noted. IV catheter discontinued intact. Site without signs and symptoms of complications - no redness or edema noted at insertion site, patient denies c/o pain - only slight tenderness at site.  Dressing with slight pressure applied.  D/c Instructions-Education: Discharge instructions given to patient/family with verbalized understanding. D/c education completed with patient/family including follow up instructions, medication list, d/c activities limitations if indicated, with other d/c instructions as indicated by MD - patient able to verbalize understanding, all questions fully answered. Patient instructed to return  to ED, call 911, or call MD for any changes in condition.  Patient escorted via [place], and D/C home via private auto.  Santa Lighter, RN 07/19/2022 4:22 PM

## 2022-07-19 NOTE — TOC Transition Note (Signed)
Transition of Care [facility]) - CM/SW Discharge Note   Patient Details  Name: Brian Dixon MRN: [id number] Date of Birth: [birth year]  Transition of Care [facility]) CM/SW Contact:  Ina Homes, [place] Phone Number: 07/19/2022, 1:42 PM   Clinical Narrative:     PT[facility] called for d/c to [facility]  Final next level of care: Skilled Nursing Facility Barriers to Discharge: Barriers Resolved   Patient Goals and CMS Choice Patient states their goals for this hospitalization and ongoing recovery are:: to go to rehab (Simultaneous filing. User may not have seen previous data.) CMS Medicare.gov Compare Post Acute Care list provided to:: Patient (Simultaneous filing. User may not have seen previous data.) Choice offered to / list presented to : Patient, Parent (Simultaneous filing. User may not have seen previous data.)  Discharge Placement              Patient chooses bed at: [facility] Patient to be transferred to facility by: [place] Name of family member notified: Bobbie Patient and family notified of of transfer: 07/19/22  Discharge Plan and Services In-house Referral: Clinical Social Work (Electrical engineer. User may not have seen previous data.) Discharge Planning Services: CM Consult Post Acute Care Choice: [place] (Simultaneous filing. User may not have seen previous data.)                               Social Determinants of Health (SDOH) Interventions     Readmission Risk Interventions     No data to display
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
## 2022-07-19 NOTE — Plan of Care (Signed)
  Problem: Education: Goal: Knowledge of General Education information will improve Description: Including pain rating scale, medication(s)/side effects and non-pharmacologic comfort measures Outcome: Adequate for Discharge   Problem: Health Behavior/Discharge Planning: Goal: Ability to manage health-related needs will improve Outcome: Adequate for Discharge   Problem: Clinical Measurements: Goal: Ability to maintain clinical measurements within normal limits will improve 07/19/2022 1345 by Santa Lighter, RN Outcome: Adequate for Discharge 07/19/2022 8101 by Santa Lighter, RN Outcome: Progressing Goal: Will remain free from infection Outcome: Adequate for Discharge Goal: Diagnostic test results will improve Outcome: Adequate for Discharge Goal: Respiratory complications will improve Outcome: Adequate for Discharge Goal: Cardiovascular complication will be avoided Outcome: Adequate for Discharge   Problem: Activity: Goal: Risk for activity intolerance will decrease Outcome: Adequate for Discharge   Problem: Nutrition: Goal: Adequate nutrition will be maintained Outcome: Adequate for Discharge   Problem: Coping: Goal: Level of anxiety will decrease Outcome: Adequate for Discharge   Problem: Elimination: Goal: Will not experience complications related to bowel motility Outcome: Adequate for Discharge Goal: Will not experience complications related to urinary retention Outcome: Adequate for Discharge   Problem: Pain Managment: Goal: General experience of comfort will improve Outcome: Adequate for Discharge   Problem: Skin Integrity: Goal: Risk for impaired skin integrity will decrease Outcome: Adequate for Discharge

## 2022-07-19 NOTE — Plan of Care (Signed)
  Problem: Clinical Measurements: Goal: Ability to maintain clinical measurements within normal limits will improve Outcome: Progressing   

## 2022-07-19 NOTE — TOC Progression Note (Addendum)
Transition of Care [facility] - Progression Note    Patient Details  Name: Brian Dixon MRN: [id number] Date of Birth: [birth year]  Transition of Care [facility]) CM/SW Contact  Ina Homes, [place] Phone Number: 07/19/2022, 11:43 AM  Clinical Narrative:     SW spoke with Juliann Pulse [facility] [phone]) reports able to accept. Awaiting bed number.   SW updated MD  Update 111pm SW spoke with pt's mother Jolayne Haines [phone]), provided location of SNF and MCR guidelines regarding auth, SNF coverage and possible outcomes if pt is needing 20 + days of SNF, all questions answered. Jolayne Haines requested to be notified when pt is leaving hospital.  Call Report: [phone] Bed 126[address]   Expected Discharge Plan: [place] (Simultaneous filing. User may not have seen previous data.) Barriers to Discharge: Continued Medical Work up (Electrical engineer. User may not have seen previous data.)  Expected Discharge Plan and Services Expected Discharge Plan: [place] (Simultaneous filing. User may not have seen previous data.) In-house Referral: Clinical Social Work English as a second language teacher. User may not have seen previous data.) Discharge Planning Services: CM Consult Post Acute Care Choice: [place] (Simultaneous filing. User may not have seen previous data.) Living arrangements for the past 2 months: Single Family Home (Simultaneous filing. User may not have seen previous data.)                                       Social Determinants of Health (SDOH) Interventions    Readmission Risk Interventions     No data to display

## 2022-07-21 DIAGNOSIS — I10 Essential (primary) hypertension: Secondary | ICD-10-CM | POA: Diagnosis not present

## 2022-07-21 DIAGNOSIS — R296 Repeated falls: Secondary | ICD-10-CM | POA: Diagnosis not present

## 2022-07-21 DIAGNOSIS — M6281 Muscle weakness (generalized): Secondary | ICD-10-CM | POA: Diagnosis not present

## 2022-07-21 DIAGNOSIS — D649 Anemia, unspecified: Secondary | ICD-10-CM | POA: Diagnosis not present

## 2022-07-21 DIAGNOSIS — K219 Gastro-esophageal reflux disease without esophagitis: Secondary | ICD-10-CM | POA: Diagnosis not present

## 2022-07-21 DIAGNOSIS — F1010 Alcohol abuse, uncomplicated: Secondary | ICD-10-CM | POA: Diagnosis not present

## 2022-07-21 DIAGNOSIS — K5900 Constipation, unspecified: Secondary | ICD-10-CM | POA: Diagnosis not present

## 2022-07-22 DIAGNOSIS — E46 Unspecified protein-calorie malnutrition: Secondary | ICD-10-CM | POA: Diagnosis not present

## 2022-07-22 DIAGNOSIS — D511 Vitamin B12 deficiency anemia due to selective vitamin B12 malabsorption with proteinuria: Secondary | ICD-10-CM | POA: Diagnosis not present

## 2022-07-22 DIAGNOSIS — E785 Hyperlipidemia, unspecified: Secondary | ICD-10-CM | POA: Diagnosis not present

## 2022-07-22 DIAGNOSIS — F10188 Alcohol abuse with other alcohol-induced disorder: Secondary | ICD-10-CM | POA: Diagnosis not present

## 2022-07-23 DIAGNOSIS — R4189 Other symptoms and signs involving cognitive functions and awareness: Secondary | ICD-10-CM | POA: Diagnosis not present

## 2022-07-23 DIAGNOSIS — R262 Difficulty in walking, not elsewhere classified: Secondary | ICD-10-CM | POA: Diagnosis not present

## 2022-07-23 DIAGNOSIS — M25569 Pain in unspecified knee: Secondary | ICD-10-CM | POA: Diagnosis not present

## 2022-07-23 DIAGNOSIS — M6281 Muscle weakness (generalized): Secondary | ICD-10-CM | POA: Diagnosis not present

## 2022-07-24 DIAGNOSIS — D649 Anemia, unspecified: Secondary | ICD-10-CM | POA: Diagnosis not present

## 2022-07-24 DIAGNOSIS — K219 Gastro-esophageal reflux disease without esophagitis: Secondary | ICD-10-CM | POA: Diagnosis not present

## 2022-07-24 DIAGNOSIS — R296 Repeated falls: Secondary | ICD-10-CM | POA: Diagnosis not present

## 2022-07-24 DIAGNOSIS — I10 Essential (primary) hypertension: Secondary | ICD-10-CM | POA: Diagnosis not present

## 2022-07-24 DIAGNOSIS — F1010 Alcohol abuse, uncomplicated: Secondary | ICD-10-CM | POA: Diagnosis not present

## 2022-07-24 DIAGNOSIS — M6281 Muscle weakness (generalized): Secondary | ICD-10-CM | POA: Diagnosis not present

## 2022-07-24 DIAGNOSIS — K5900 Constipation, unspecified: Secondary | ICD-10-CM | POA: Diagnosis not present

## 2022-07-28 DIAGNOSIS — M545 Low back pain, unspecified: Secondary | ICD-10-CM | POA: Diagnosis not present

## 2022-07-28 DIAGNOSIS — M6281 Muscle weakness (generalized): Secondary | ICD-10-CM | POA: Diagnosis not present

## 2022-07-28 DIAGNOSIS — R296 Repeated falls: Secondary | ICD-10-CM | POA: Diagnosis not present

## 2022-07-30 DIAGNOSIS — K219 Gastro-esophageal reflux disease without esophagitis: Secondary | ICD-10-CM | POA: Diagnosis not present

## 2022-07-30 DIAGNOSIS — R112 Nausea with vomiting, unspecified: Secondary | ICD-10-CM | POA: Diagnosis not present

## 2022-07-30 DIAGNOSIS — R1084 Generalized abdominal pain: Secondary | ICD-10-CM | POA: Diagnosis not present

## 2022-07-31 DIAGNOSIS — R1084 Generalized abdominal pain: Secondary | ICD-10-CM | POA: Diagnosis not present

## 2022-07-31 DIAGNOSIS — R112 Nausea with vomiting, unspecified: Secondary | ICD-10-CM | POA: Diagnosis not present

## 2022-07-31 DIAGNOSIS — I10 Essential (primary) hypertension: Secondary | ICD-10-CM | POA: Diagnosis not present

## 2022-08-01 DIAGNOSIS — R1011 Right upper quadrant pain: Secondary | ICD-10-CM | POA: Diagnosis not present

## 2022-08-01 DIAGNOSIS — G621 Alcoholic polyneuropathy: Secondary | ICD-10-CM | POA: Diagnosis not present

## 2022-08-01 DIAGNOSIS — R112 Nausea with vomiting, unspecified: Secondary | ICD-10-CM | POA: Diagnosis not present

## 2022-08-04 DIAGNOSIS — F10951 Alcohol use, unspecified with alcohol-induced psychotic disorder with hallucinations: Secondary | ICD-10-CM | POA: Diagnosis not present

## 2022-08-04 DIAGNOSIS — R296 Repeated falls: Secondary | ICD-10-CM | POA: Diagnosis not present

## 2022-08-04 DIAGNOSIS — R1011 Right upper quadrant pain: Secondary | ICD-10-CM | POA: Diagnosis not present

## 2022-08-04 DIAGNOSIS — G621 Alcoholic polyneuropathy: Secondary | ICD-10-CM | POA: Diagnosis not present

## 2022-08-05 DIAGNOSIS — R109 Unspecified abdominal pain: Secondary | ICD-10-CM | POA: Diagnosis not present

## 2022-08-07 DIAGNOSIS — D511 Vitamin B12 deficiency anemia due to selective vitamin B12 malabsorption with proteinuria: Secondary | ICD-10-CM | POA: Diagnosis not present

## 2022-08-07 DIAGNOSIS — M6281 Muscle weakness (generalized): Secondary | ICD-10-CM | POA: Diagnosis not present

## 2022-08-07 DIAGNOSIS — R1312 Dysphagia, oropharyngeal phase: Secondary | ICD-10-CM | POA: Diagnosis not present

## 2022-08-07 DIAGNOSIS — R41841 Cognitive communication deficit: Secondary | ICD-10-CM | POA: Diagnosis not present

## 2022-08-08 DIAGNOSIS — R41841 Cognitive communication deficit: Secondary | ICD-10-CM | POA: Diagnosis not present

## 2022-08-08 DIAGNOSIS — D511 Vitamin B12 deficiency anemia due to selective vitamin B12 malabsorption with proteinuria: Secondary | ICD-10-CM | POA: Diagnosis not present

## 2022-08-08 DIAGNOSIS — R1312 Dysphagia, oropharyngeal phase: Secondary | ICD-10-CM | POA: Diagnosis not present

## 2022-08-08 DIAGNOSIS — M6281 Muscle weakness (generalized): Secondary | ICD-10-CM | POA: Diagnosis not present

## 2022-08-09 DIAGNOSIS — R41841 Cognitive communication deficit: Secondary | ICD-10-CM | POA: Diagnosis not present

## 2022-08-09 DIAGNOSIS — M6281 Muscle weakness (generalized): Secondary | ICD-10-CM | POA: Diagnosis not present

## 2022-08-09 DIAGNOSIS — R1312 Dysphagia, oropharyngeal phase: Secondary | ICD-10-CM | POA: Diagnosis not present

## 2022-08-09 DIAGNOSIS — D511 Vitamin B12 deficiency anemia due to selective vitamin B12 malabsorption with proteinuria: Secondary | ICD-10-CM | POA: Diagnosis not present

## 2022-08-10 DIAGNOSIS — R1312 Dysphagia, oropharyngeal phase: Secondary | ICD-10-CM | POA: Diagnosis not present

## 2022-08-10 DIAGNOSIS — R41841 Cognitive communication deficit: Secondary | ICD-10-CM | POA: Diagnosis not present

## 2022-08-10 DIAGNOSIS — M6281 Muscle weakness (generalized): Secondary | ICD-10-CM | POA: Diagnosis not present

## 2022-08-10 DIAGNOSIS — D511 Vitamin B12 deficiency anemia due to selective vitamin B12 malabsorption with proteinuria: Secondary | ICD-10-CM | POA: Diagnosis not present

## 2022-08-11 DIAGNOSIS — D511 Vitamin B12 deficiency anemia due to selective vitamin B12 malabsorption with proteinuria: Secondary | ICD-10-CM | POA: Diagnosis not present

## 2022-08-11 DIAGNOSIS — R1312 Dysphagia, oropharyngeal phase: Secondary | ICD-10-CM | POA: Diagnosis not present

## 2022-08-11 DIAGNOSIS — R41841 Cognitive communication deficit: Secondary | ICD-10-CM | POA: Diagnosis not present

## 2022-08-11 DIAGNOSIS — M6281 Muscle weakness (generalized): Secondary | ICD-10-CM | POA: Diagnosis not present

## 2022-08-12 DIAGNOSIS — M6281 Muscle weakness (generalized): Secondary | ICD-10-CM | POA: Diagnosis not present

## 2022-08-12 DIAGNOSIS — R112 Nausea with vomiting, unspecified: Secondary | ICD-10-CM | POA: Diagnosis not present

## 2022-08-12 DIAGNOSIS — F10951 Alcohol use, unspecified with alcohol-induced psychotic disorder with hallucinations: Secondary | ICD-10-CM | POA: Diagnosis not present

## 2022-08-12 DIAGNOSIS — D511 Vitamin B12 deficiency anemia due to selective vitamin B12 malabsorption with proteinuria: Secondary | ICD-10-CM | POA: Diagnosis not present

## 2022-08-12 DIAGNOSIS — R1312 Dysphagia, oropharyngeal phase: Secondary | ICD-10-CM | POA: Diagnosis not present

## 2022-08-12 DIAGNOSIS — R41841 Cognitive communication deficit: Secondary | ICD-10-CM | POA: Diagnosis not present

## 2022-08-12 DIAGNOSIS — G621 Alcoholic polyneuropathy: Secondary | ICD-10-CM | POA: Diagnosis not present

## 2022-08-12 DIAGNOSIS — R1084 Generalized abdominal pain: Secondary | ICD-10-CM | POA: Diagnosis not present

## 2022-08-13 DIAGNOSIS — D511 Vitamin B12 deficiency anemia due to selective vitamin B12 malabsorption with proteinuria: Secondary | ICD-10-CM | POA: Diagnosis not present

## 2022-08-13 DIAGNOSIS — R41841 Cognitive communication deficit: Secondary | ICD-10-CM | POA: Diagnosis not present

## 2022-08-13 DIAGNOSIS — M6281 Muscle weakness (generalized): Secondary | ICD-10-CM | POA: Diagnosis not present

## 2022-08-13 DIAGNOSIS — R1312 Dysphagia, oropharyngeal phase: Secondary | ICD-10-CM | POA: Diagnosis not present

## 2022-08-13 DIAGNOSIS — U071 COVID-19: Secondary | ICD-10-CM | POA: Diagnosis not present

## 2022-08-13 DIAGNOSIS — F10951 Alcohol use, unspecified with alcohol-induced psychotic disorder with hallucinations: Secondary | ICD-10-CM | POA: Diagnosis not present

## 2022-08-15 DIAGNOSIS — R443 Hallucinations, unspecified: Secondary | ICD-10-CM | POA: Diagnosis not present

## 2022-08-15 DIAGNOSIS — R1312 Dysphagia, oropharyngeal phase: Secondary | ICD-10-CM | POA: Diagnosis not present

## 2022-08-15 DIAGNOSIS — R41841 Cognitive communication deficit: Secondary | ICD-10-CM | POA: Diagnosis not present

## 2022-08-15 DIAGNOSIS — M6281 Muscle weakness (generalized): Secondary | ICD-10-CM | POA: Diagnosis not present

## 2022-08-15 DIAGNOSIS — D511 Vitamin B12 deficiency anemia due to selective vitamin B12 malabsorption with proteinuria: Secondary | ICD-10-CM | POA: Diagnosis not present

## 2022-08-18 DIAGNOSIS — I959 Hypotension, unspecified: Secondary | ICD-10-CM | POA: Diagnosis not present

## 2022-08-18 DIAGNOSIS — U071 COVID-19: Secondary | ICD-10-CM | POA: Diagnosis not present

## 2022-08-18 DIAGNOSIS — M6281 Muscle weakness (generalized): Secondary | ICD-10-CM | POA: Diagnosis not present

## 2022-08-18 DIAGNOSIS — R41841 Cognitive communication deficit: Secondary | ICD-10-CM | POA: Diagnosis not present

## 2022-08-18 DIAGNOSIS — D511 Vitamin B12 deficiency anemia due to selective vitamin B12 malabsorption with proteinuria: Secondary | ICD-10-CM | POA: Diagnosis not present

## 2022-08-18 DIAGNOSIS — R296 Repeated falls: Secondary | ICD-10-CM | POA: Diagnosis not present

## 2022-08-18 DIAGNOSIS — R1312 Dysphagia, oropharyngeal phase: Secondary | ICD-10-CM | POA: Diagnosis not present

## 2022-08-19 DIAGNOSIS — M6281 Muscle weakness (generalized): Secondary | ICD-10-CM | POA: Diagnosis not present

## 2022-08-19 DIAGNOSIS — I959 Hypotension, unspecified: Secondary | ICD-10-CM | POA: Diagnosis not present

## 2022-08-19 DIAGNOSIS — W19XXXA Unspecified fall, initial encounter: Secondary | ICD-10-CM | POA: Diagnosis not present

## 2022-08-19 DIAGNOSIS — M25512 Pain in left shoulder: Secondary | ICD-10-CM | POA: Diagnosis not present

## 2022-08-19 DIAGNOSIS — M25552 Pain in left hip: Secondary | ICD-10-CM | POA: Diagnosis not present

## 2022-08-19 DIAGNOSIS — R41841 Cognitive communication deficit: Secondary | ICD-10-CM | POA: Diagnosis not present

## 2022-08-19 DIAGNOSIS — D511 Vitamin B12 deficiency anemia due to selective vitamin B12 malabsorption with proteinuria: Secondary | ICD-10-CM | POA: Diagnosis not present

## 2022-08-19 DIAGNOSIS — U071 COVID-19: Secondary | ICD-10-CM | POA: Diagnosis not present

## 2022-08-19 DIAGNOSIS — R1312 Dysphagia, oropharyngeal phase: Secondary | ICD-10-CM | POA: Diagnosis not present

## 2022-08-19 DIAGNOSIS — I10 Essential (primary) hypertension: Secondary | ICD-10-CM | POA: Diagnosis not present

## 2022-08-20 DIAGNOSIS — W19XXXA Unspecified fall, initial encounter: Secondary | ICD-10-CM | POA: Diagnosis not present

## 2022-08-20 DIAGNOSIS — R41841 Cognitive communication deficit: Secondary | ICD-10-CM | POA: Diagnosis not present

## 2022-08-20 DIAGNOSIS — R296 Repeated falls: Secondary | ICD-10-CM | POA: Diagnosis not present

## 2022-08-20 DIAGNOSIS — I959 Hypotension, unspecified: Secondary | ICD-10-CM | POA: Diagnosis not present

## 2022-08-20 DIAGNOSIS — M6281 Muscle weakness (generalized): Secondary | ICD-10-CM | POA: Diagnosis not present

## 2022-08-20 DIAGNOSIS — U071 COVID-19: Secondary | ICD-10-CM | POA: Diagnosis not present

## 2022-08-20 DIAGNOSIS — R1312 Dysphagia, oropharyngeal phase: Secondary | ICD-10-CM | POA: Diagnosis not present

## 2022-08-20 DIAGNOSIS — M25512 Pain in left shoulder: Secondary | ICD-10-CM | POA: Diagnosis not present

## 2022-08-20 DIAGNOSIS — D511 Vitamin B12 deficiency anemia due to selective vitamin B12 malabsorption with proteinuria: Secondary | ICD-10-CM | POA: Diagnosis not present

## 2022-08-20 DIAGNOSIS — M25552 Pain in left hip: Secondary | ICD-10-CM | POA: Diagnosis not present

## 2022-08-21 DIAGNOSIS — D511 Vitamin B12 deficiency anemia due to selective vitamin B12 malabsorption with proteinuria: Secondary | ICD-10-CM | POA: Diagnosis not present

## 2022-08-21 DIAGNOSIS — M6281 Muscle weakness (generalized): Secondary | ICD-10-CM | POA: Diagnosis not present

## 2022-08-21 DIAGNOSIS — R1312 Dysphagia, oropharyngeal phase: Secondary | ICD-10-CM | POA: Diagnosis not present

## 2022-08-21 DIAGNOSIS — R41841 Cognitive communication deficit: Secondary | ICD-10-CM | POA: Diagnosis not present

## 2022-08-22 DIAGNOSIS — F419 Anxiety disorder, unspecified: Secondary | ICD-10-CM | POA: Diagnosis not present

## 2022-08-22 DIAGNOSIS — F1014 Alcohol abuse with alcohol-induced mood disorder: Secondary | ICD-10-CM | POA: Diagnosis not present

## 2022-08-22 DIAGNOSIS — Z87891 Personal history of nicotine dependence: Secondary | ICD-10-CM | POA: Diagnosis not present

## 2022-08-22 DIAGNOSIS — F331 Major depressive disorder, recurrent, moderate: Secondary | ICD-10-CM | POA: Diagnosis not present

## 2022-08-22 DIAGNOSIS — F5105 Insomnia due to other mental disorder: Secondary | ICD-10-CM | POA: Diagnosis not present

## 2022-08-23 DIAGNOSIS — M6281 Muscle weakness (generalized): Secondary | ICD-10-CM | POA: Diagnosis not present

## 2022-08-23 DIAGNOSIS — D511 Vitamin B12 deficiency anemia due to selective vitamin B12 malabsorption with proteinuria: Secondary | ICD-10-CM | POA: Diagnosis not present

## 2022-08-23 DIAGNOSIS — R41841 Cognitive communication deficit: Secondary | ICD-10-CM | POA: Diagnosis not present

## 2022-08-23 DIAGNOSIS — R1312 Dysphagia, oropharyngeal phase: Secondary | ICD-10-CM | POA: Diagnosis not present

## 2022-08-25 DIAGNOSIS — F3341 Major depressive disorder, recurrent, in partial remission: Secondary | ICD-10-CM | POA: Diagnosis not present

## 2022-08-25 DIAGNOSIS — F1091 Alcohol use, unspecified, in remission: Secondary | ICD-10-CM | POA: Diagnosis not present

## 2022-08-26 DIAGNOSIS — R41841 Cognitive communication deficit: Secondary | ICD-10-CM | POA: Diagnosis not present

## 2022-08-26 DIAGNOSIS — M6281 Muscle weakness (generalized): Secondary | ICD-10-CM | POA: Diagnosis not present

## 2022-08-26 DIAGNOSIS — D511 Vitamin B12 deficiency anemia due to selective vitamin B12 malabsorption with proteinuria: Secondary | ICD-10-CM | POA: Diagnosis not present

## 2022-08-26 DIAGNOSIS — R1312 Dysphagia, oropharyngeal phase: Secondary | ICD-10-CM | POA: Diagnosis not present

## 2022-08-27 DIAGNOSIS — R41841 Cognitive communication deficit: Secondary | ICD-10-CM | POA: Diagnosis not present

## 2022-08-27 DIAGNOSIS — R1312 Dysphagia, oropharyngeal phase: Secondary | ICD-10-CM | POA: Diagnosis not present

## 2022-08-27 DIAGNOSIS — M6281 Muscle weakness (generalized): Secondary | ICD-10-CM | POA: Diagnosis not present

## 2022-08-27 DIAGNOSIS — D511 Vitamin B12 deficiency anemia due to selective vitamin B12 malabsorption with proteinuria: Secondary | ICD-10-CM | POA: Diagnosis not present

## 2022-08-28 DIAGNOSIS — R1312 Dysphagia, oropharyngeal phase: Secondary | ICD-10-CM | POA: Diagnosis not present

## 2022-08-28 DIAGNOSIS — D511 Vitamin B12 deficiency anemia due to selective vitamin B12 malabsorption with proteinuria: Secondary | ICD-10-CM | POA: Diagnosis not present

## 2022-08-28 DIAGNOSIS — M6281 Muscle weakness (generalized): Secondary | ICD-10-CM | POA: Diagnosis not present

## 2022-08-28 DIAGNOSIS — R41841 Cognitive communication deficit: Secondary | ICD-10-CM | POA: Diagnosis not present

## 2022-08-29 DIAGNOSIS — R1312 Dysphagia, oropharyngeal phase: Secondary | ICD-10-CM | POA: Diagnosis not present

## 2022-08-29 DIAGNOSIS — D511 Vitamin B12 deficiency anemia due to selective vitamin B12 malabsorption with proteinuria: Secondary | ICD-10-CM | POA: Diagnosis not present

## 2022-08-29 DIAGNOSIS — R41841 Cognitive communication deficit: Secondary | ICD-10-CM | POA: Diagnosis not present

## 2022-08-29 DIAGNOSIS — M6281 Muscle weakness (generalized): Secondary | ICD-10-CM | POA: Diagnosis not present

## 2022-08-30 DIAGNOSIS — D511 Vitamin B12 deficiency anemia due to selective vitamin B12 malabsorption with proteinuria: Secondary | ICD-10-CM | POA: Diagnosis not present

## 2022-08-30 DIAGNOSIS — M6281 Muscle weakness (generalized): Secondary | ICD-10-CM | POA: Diagnosis not present

## 2022-08-30 DIAGNOSIS — R1312 Dysphagia, oropharyngeal phase: Secondary | ICD-10-CM | POA: Diagnosis not present

## 2022-08-30 DIAGNOSIS — R41841 Cognitive communication deficit: Secondary | ICD-10-CM | POA: Diagnosis not present

## 2022-08-31 DIAGNOSIS — D511 Vitamin B12 deficiency anemia due to selective vitamin B12 malabsorption with proteinuria: Secondary | ICD-10-CM | POA: Diagnosis not present

## 2022-08-31 DIAGNOSIS — M6281 Muscle weakness (generalized): Secondary | ICD-10-CM | POA: Diagnosis not present

## 2022-08-31 DIAGNOSIS — R41841 Cognitive communication deficit: Secondary | ICD-10-CM | POA: Diagnosis not present

## 2022-08-31 DIAGNOSIS — R1312 Dysphagia, oropharyngeal phase: Secondary | ICD-10-CM | POA: Diagnosis not present

## 2022-09-01 DIAGNOSIS — F1091 Alcohol use, unspecified, in remission: Secondary | ICD-10-CM | POA: Diagnosis not present

## 2022-09-01 DIAGNOSIS — R41841 Cognitive communication deficit: Secondary | ICD-10-CM | POA: Diagnosis not present

## 2022-09-01 DIAGNOSIS — F3341 Major depressive disorder, recurrent, in partial remission: Secondary | ICD-10-CM | POA: Diagnosis not present

## 2022-09-01 DIAGNOSIS — R1312 Dysphagia, oropharyngeal phase: Secondary | ICD-10-CM | POA: Diagnosis not present

## 2022-09-01 DIAGNOSIS — M6281 Muscle weakness (generalized): Secondary | ICD-10-CM | POA: Diagnosis not present

## 2022-09-01 DIAGNOSIS — F1014 Alcohol abuse with alcohol-induced mood disorder: Secondary | ICD-10-CM | POA: Diagnosis not present

## 2022-09-01 DIAGNOSIS — D511 Vitamin B12 deficiency anemia due to selective vitamin B12 malabsorption with proteinuria: Secondary | ICD-10-CM | POA: Diagnosis not present

## 2022-09-01 DIAGNOSIS — Z87891 Personal history of nicotine dependence: Secondary | ICD-10-CM | POA: Diagnosis not present

## 2022-09-02 DIAGNOSIS — D511 Vitamin B12 deficiency anemia due to selective vitamin B12 malabsorption with proteinuria: Secondary | ICD-10-CM | POA: Diagnosis not present

## 2022-09-02 DIAGNOSIS — M6281 Muscle weakness (generalized): Secondary | ICD-10-CM | POA: Diagnosis not present

## 2022-09-02 DIAGNOSIS — R41841 Cognitive communication deficit: Secondary | ICD-10-CM | POA: Diagnosis not present

## 2022-09-02 DIAGNOSIS — R1312 Dysphagia, oropharyngeal phase: Secondary | ICD-10-CM | POA: Diagnosis not present

## 2022-09-03 DIAGNOSIS — D511 Vitamin B12 deficiency anemia due to selective vitamin B12 malabsorption with proteinuria: Secondary | ICD-10-CM | POA: Diagnosis not present

## 2022-09-03 DIAGNOSIS — R1312 Dysphagia, oropharyngeal phase: Secondary | ICD-10-CM | POA: Diagnosis not present

## 2022-09-03 DIAGNOSIS — M6281 Muscle weakness (generalized): Secondary | ICD-10-CM | POA: Diagnosis not present

## 2022-09-03 DIAGNOSIS — R41841 Cognitive communication deficit: Secondary | ICD-10-CM | POA: Diagnosis not present

## 2022-09-03 DIAGNOSIS — R451 Restlessness and agitation: Secondary | ICD-10-CM | POA: Diagnosis not present

## 2022-09-03 DIAGNOSIS — Z720 Tobacco use: Secondary | ICD-10-CM | POA: Diagnosis not present

## 2022-09-05 DIAGNOSIS — I739 Peripheral vascular disease, unspecified: Secondary | ICD-10-CM | POA: Diagnosis not present

## 2022-09-05 DIAGNOSIS — B351 Tinea unguium: Secondary | ICD-10-CM | POA: Diagnosis not present

## 2022-09-05 DIAGNOSIS — D649 Anemia, unspecified: Secondary | ICD-10-CM | POA: Diagnosis not present

## 2022-09-05 DIAGNOSIS — I959 Hypotension, unspecified: Secondary | ICD-10-CM | POA: Diagnosis not present

## 2022-09-05 DIAGNOSIS — Z720 Tobacco use: Secondary | ICD-10-CM | POA: Diagnosis not present

## 2022-09-05 DIAGNOSIS — M25552 Pain in left hip: Secondary | ICD-10-CM | POA: Diagnosis not present

## 2022-09-05 DIAGNOSIS — M25512 Pain in left shoulder: Secondary | ICD-10-CM | POA: Diagnosis not present

## 2022-09-05 DIAGNOSIS — G621 Alcoholic polyneuropathy: Secondary | ICD-10-CM | POA: Diagnosis not present

## 2022-09-05 DIAGNOSIS — F1010 Alcohol abuse, uncomplicated: Secondary | ICD-10-CM | POA: Diagnosis not present

## 2022-09-08 DIAGNOSIS — F1014 Alcohol abuse with alcohol-induced mood disorder: Secondary | ICD-10-CM | POA: Diagnosis not present

## 2022-09-08 DIAGNOSIS — F3341 Major depressive disorder, recurrent, in partial remission: Secondary | ICD-10-CM | POA: Diagnosis not present

## 2022-09-08 DIAGNOSIS — E119 Type 2 diabetes mellitus without complications: Secondary | ICD-10-CM | POA: Diagnosis not present

## 2022-09-08 DIAGNOSIS — E559 Vitamin D deficiency, unspecified: Secondary | ICD-10-CM | POA: Diagnosis not present

## 2022-09-09 DIAGNOSIS — E559 Vitamin D deficiency, unspecified: Secondary | ICD-10-CM | POA: Diagnosis not present

## 2022-09-09 DIAGNOSIS — F1010 Alcohol abuse, uncomplicated: Secondary | ICD-10-CM | POA: Diagnosis not present

## 2022-09-09 DIAGNOSIS — D649 Anemia, unspecified: Secondary | ICD-10-CM | POA: Diagnosis not present

## 2022-09-12 DIAGNOSIS — R1312 Dysphagia, oropharyngeal phase: Secondary | ICD-10-CM | POA: Diagnosis not present

## 2022-09-12 DIAGNOSIS — D511 Vitamin B12 deficiency anemia due to selective vitamin B12 malabsorption with proteinuria: Secondary | ICD-10-CM | POA: Diagnosis not present

## 2022-09-12 DIAGNOSIS — R41841 Cognitive communication deficit: Secondary | ICD-10-CM | POA: Diagnosis not present

## 2022-09-12 DIAGNOSIS — M6281 Muscle weakness (generalized): Secondary | ICD-10-CM | POA: Diagnosis not present

## 2022-09-13 DIAGNOSIS — R1312 Dysphagia, oropharyngeal phase: Secondary | ICD-10-CM | POA: Diagnosis not present

## 2022-09-13 DIAGNOSIS — D511 Vitamin B12 deficiency anemia due to selective vitamin B12 malabsorption with proteinuria: Secondary | ICD-10-CM | POA: Diagnosis not present

## 2022-09-13 DIAGNOSIS — R41841 Cognitive communication deficit: Secondary | ICD-10-CM | POA: Diagnosis not present

## 2022-09-13 DIAGNOSIS — M6281 Muscle weakness (generalized): Secondary | ICD-10-CM | POA: Diagnosis not present

## 2022-09-14 DIAGNOSIS — R1312 Dysphagia, oropharyngeal phase: Secondary | ICD-10-CM | POA: Diagnosis not present

## 2022-09-14 DIAGNOSIS — R41841 Cognitive communication deficit: Secondary | ICD-10-CM | POA: Diagnosis not present

## 2022-09-14 DIAGNOSIS — D511 Vitamin B12 deficiency anemia due to selective vitamin B12 malabsorption with proteinuria: Secondary | ICD-10-CM | POA: Diagnosis not present

## 2022-09-14 DIAGNOSIS — M6281 Muscle weakness (generalized): Secondary | ICD-10-CM | POA: Diagnosis not present

## 2022-09-15 DIAGNOSIS — R41841 Cognitive communication deficit: Secondary | ICD-10-CM | POA: Diagnosis not present

## 2022-09-15 DIAGNOSIS — R1312 Dysphagia, oropharyngeal phase: Secondary | ICD-10-CM | POA: Diagnosis not present

## 2022-09-15 DIAGNOSIS — D511 Vitamin B12 deficiency anemia due to selective vitamin B12 malabsorption with proteinuria: Secondary | ICD-10-CM | POA: Diagnosis not present

## 2022-09-15 DIAGNOSIS — M6281 Muscle weakness (generalized): Secondary | ICD-10-CM | POA: Diagnosis not present

## 2022-09-16 DIAGNOSIS — M6281 Muscle weakness (generalized): Secondary | ICD-10-CM | POA: Diagnosis not present

## 2022-09-16 DIAGNOSIS — R41841 Cognitive communication deficit: Secondary | ICD-10-CM | POA: Diagnosis not present

## 2022-09-16 DIAGNOSIS — D511 Vitamin B12 deficiency anemia due to selective vitamin B12 malabsorption with proteinuria: Secondary | ICD-10-CM | POA: Diagnosis not present

## 2022-09-16 DIAGNOSIS — R1312 Dysphagia, oropharyngeal phase: Secondary | ICD-10-CM | POA: Diagnosis not present

## 2022-09-17 DIAGNOSIS — M6281 Muscle weakness (generalized): Secondary | ICD-10-CM | POA: Diagnosis not present

## 2022-09-17 DIAGNOSIS — R1312 Dysphagia, oropharyngeal phase: Secondary | ICD-10-CM | POA: Diagnosis not present

## 2022-09-17 DIAGNOSIS — R41841 Cognitive communication deficit: Secondary | ICD-10-CM | POA: Diagnosis not present

## 2022-09-17 DIAGNOSIS — D511 Vitamin B12 deficiency anemia due to selective vitamin B12 malabsorption with proteinuria: Secondary | ICD-10-CM | POA: Diagnosis not present

## 2022-09-18 DIAGNOSIS — R41841 Cognitive communication deficit: Secondary | ICD-10-CM | POA: Diagnosis not present

## 2022-09-18 DIAGNOSIS — R1312 Dysphagia, oropharyngeal phase: Secondary | ICD-10-CM | POA: Diagnosis not present

## 2022-09-18 DIAGNOSIS — D511 Vitamin B12 deficiency anemia due to selective vitamin B12 malabsorption with proteinuria: Secondary | ICD-10-CM | POA: Diagnosis not present

## 2022-09-18 DIAGNOSIS — M6281 Muscle weakness (generalized): Secondary | ICD-10-CM | POA: Diagnosis not present

## 2022-09-19 DIAGNOSIS — G4700 Insomnia, unspecified: Secondary | ICD-10-CM | POA: Diagnosis not present

## 2022-09-19 DIAGNOSIS — F1091 Alcohol use, unspecified, in remission: Secondary | ICD-10-CM | POA: Diagnosis not present

## 2022-09-19 DIAGNOSIS — F331 Major depressive disorder, recurrent, moderate: Secondary | ICD-10-CM | POA: Diagnosis not present

## 2022-09-19 DIAGNOSIS — F1014 Alcohol abuse with alcohol-induced mood disorder: Secondary | ICD-10-CM | POA: Diagnosis not present

## 2022-09-22 DIAGNOSIS — F331 Major depressive disorder, recurrent, moderate: Secondary | ICD-10-CM | POA: Diagnosis not present

## 2022-09-22 DIAGNOSIS — D511 Vitamin B12 deficiency anemia due to selective vitamin B12 malabsorption with proteinuria: Secondary | ICD-10-CM | POA: Diagnosis not present

## 2022-09-22 DIAGNOSIS — M6281 Muscle weakness (generalized): Secondary | ICD-10-CM | POA: Diagnosis not present

## 2022-09-22 DIAGNOSIS — R41841 Cognitive communication deficit: Secondary | ICD-10-CM | POA: Diagnosis not present

## 2022-09-22 DIAGNOSIS — F1014 Alcohol abuse with alcohol-induced mood disorder: Secondary | ICD-10-CM | POA: Diagnosis not present

## 2022-09-22 DIAGNOSIS — R1312 Dysphagia, oropharyngeal phase: Secondary | ICD-10-CM | POA: Diagnosis not present

## 2022-09-23 DIAGNOSIS — D511 Vitamin B12 deficiency anemia due to selective vitamin B12 malabsorption with proteinuria: Secondary | ICD-10-CM | POA: Diagnosis not present

## 2022-09-23 DIAGNOSIS — M6281 Muscle weakness (generalized): Secondary | ICD-10-CM | POA: Diagnosis not present

## 2022-09-23 DIAGNOSIS — R41841 Cognitive communication deficit: Secondary | ICD-10-CM | POA: Diagnosis not present

## 2022-09-23 DIAGNOSIS — R1312 Dysphagia, oropharyngeal phase: Secondary | ICD-10-CM | POA: Diagnosis not present

## 2022-09-24 DIAGNOSIS — R1312 Dysphagia, oropharyngeal phase: Secondary | ICD-10-CM | POA: Diagnosis not present

## 2022-09-24 DIAGNOSIS — R41841 Cognitive communication deficit: Secondary | ICD-10-CM | POA: Diagnosis not present

## 2022-09-24 DIAGNOSIS — M6281 Muscle weakness (generalized): Secondary | ICD-10-CM | POA: Diagnosis not present

## 2022-09-24 DIAGNOSIS — D511 Vitamin B12 deficiency anemia due to selective vitamin B12 malabsorption with proteinuria: Secondary | ICD-10-CM | POA: Diagnosis not present

## 2022-09-24 DIAGNOSIS — R451 Restlessness and agitation: Secondary | ICD-10-CM | POA: Diagnosis not present

## 2022-09-25 DIAGNOSIS — D511 Vitamin B12 deficiency anemia due to selective vitamin B12 malabsorption with proteinuria: Secondary | ICD-10-CM | POA: Diagnosis not present

## 2022-09-25 DIAGNOSIS — R1312 Dysphagia, oropharyngeal phase: Secondary | ICD-10-CM | POA: Diagnosis not present

## 2022-09-25 DIAGNOSIS — M6281 Muscle weakness (generalized): Secondary | ICD-10-CM | POA: Diagnosis not present

## 2022-09-25 DIAGNOSIS — R41841 Cognitive communication deficit: Secondary | ICD-10-CM | POA: Diagnosis not present

## 2022-09-26 DIAGNOSIS — M6281 Muscle weakness (generalized): Secondary | ICD-10-CM | POA: Diagnosis not present

## 2022-09-26 DIAGNOSIS — R1312 Dysphagia, oropharyngeal phase: Secondary | ICD-10-CM | POA: Diagnosis not present

## 2022-09-26 DIAGNOSIS — G621 Alcoholic polyneuropathy: Secondary | ICD-10-CM | POA: Diagnosis not present

## 2022-09-26 DIAGNOSIS — R41841 Cognitive communication deficit: Secondary | ICD-10-CM | POA: Diagnosis not present

## 2022-09-26 DIAGNOSIS — D511 Vitamin B12 deficiency anemia due to selective vitamin B12 malabsorption with proteinuria: Secondary | ICD-10-CM | POA: Diagnosis not present

## 2022-11-03 ENCOUNTER — Encounter: Payer: Self-pay | Admitting: Nurse Practitioner

## 2022-12-04 ENCOUNTER — Ambulatory Visit: Payer: Self-pay | Admitting: Nurse Practitioner

## 2023-02-17 ENCOUNTER — Encounter: Payer: Self-pay | Admitting: Gastroenterology

## 2023-05-08 ENCOUNTER — Encounter (HOSPITAL_BASED_OUTPATIENT_CLINIC_OR_DEPARTMENT_OTHER): Payer: Medicaid Other | Admitting: General Surgery

## 2023-05-30 DEATH — deceased
